# Patient Record
Sex: Female | Born: 1965 | Race: White | Hispanic: No | Marital: Married | Smoking: Never smoker
Health system: Southern US, Community
[De-identification: ages and names within clinical notes are randomized; demographics above are authoritative.]

## PROBLEM LIST (undated history)

## (undated) DIAGNOSIS — R1900 Intra-abdominal and pelvic swelling, mass and lump, unspecified site: Secondary | ICD-10-CM

## (undated) DIAGNOSIS — D369 Benign neoplasm, unspecified site: Secondary | ICD-10-CM

## (undated) DIAGNOSIS — R109 Unspecified abdominal pain: Secondary | ICD-10-CM

## (undated) DIAGNOSIS — N979 Female infertility, unspecified: Secondary | ICD-10-CM

## (undated) DIAGNOSIS — E049 Nontoxic goiter, unspecified: Secondary | ICD-10-CM

## (undated) DIAGNOSIS — O009 Unspecified ectopic pregnancy without intrauterine pregnancy: Secondary | ICD-10-CM

## (undated) DIAGNOSIS — C569 Malignant neoplasm of unspecified ovary: Secondary | ICD-10-CM

## (undated) DIAGNOSIS — Z8619 Personal history of other infectious and parasitic diseases: Secondary | ICD-10-CM

## (undated) DIAGNOSIS — E039 Hypothyroidism, unspecified: Secondary | ICD-10-CM

## (undated) DIAGNOSIS — M7500 Adhesive capsulitis of unspecified shoulder: Secondary | ICD-10-CM

## (undated) DIAGNOSIS — K649 Unspecified hemorrhoids: Secondary | ICD-10-CM

## (undated) HISTORY — DX: Personal history of other infectious and parasitic diseases: Z86.19

## (undated) HISTORY — DX: Nontoxic goiter, unspecified: E04.9

## (undated) HISTORY — DX: Hypothyroidism, unspecified: E03.9

## (undated) HISTORY — DX: Unspecified hemorrhoids: K64.9

## (undated) HISTORY — DX: Intra-abdominal and pelvic swelling, mass and lump, unspecified site: R19.00

## (undated) HISTORY — PX: LAPAROSCOPY FOR ECTOPIC PREGNANCY: SUR765

## (undated) HISTORY — DX: Malignant neoplasm of unspecified ovary: C56.9

## (undated) HISTORY — DX: Benign neoplasm, unspecified site: D36.9

## (undated) HISTORY — DX: Female infertility, unspecified: N97.9

## (undated) HISTORY — DX: Unspecified abdominal pain: R10.9

## (undated) HISTORY — DX: Adhesive capsulitis of unspecified shoulder: M75.00

---

## 1999-09-25 ENCOUNTER — Other Ambulatory Visit: Admission: RE | Admit: 1999-09-25 | Discharge: 1999-09-25 | Payer: Self-pay | Admitting: *Deleted

## 2000-12-22 ENCOUNTER — Other Ambulatory Visit: Admission: RE | Admit: 2000-12-22 | Discharge: 2000-12-22 | Payer: Self-pay | Admitting: *Deleted

## 2001-12-28 ENCOUNTER — Other Ambulatory Visit: Admission: RE | Admit: 2001-12-28 | Discharge: 2001-12-28 | Payer: Self-pay | Admitting: Obstetrics and Gynecology

## 2002-01-22 ENCOUNTER — Encounter: Admission: RE | Admit: 2002-01-22 | Discharge: 2002-01-22 | Payer: Self-pay | Admitting: Family Medicine

## 2002-01-22 ENCOUNTER — Encounter: Payer: Self-pay | Admitting: Family Medicine

## 2002-07-29 ENCOUNTER — Encounter (INDEPENDENT_AMBULATORY_CARE_PROVIDER_SITE_OTHER): Payer: Self-pay | Admitting: Specialist

## 2002-07-29 ENCOUNTER — Observation Stay (HOSPITAL_COMMUNITY): Admission: AD | Admit: 2002-07-29 | Discharge: 2002-07-30 | Payer: Self-pay | Admitting: Obstetrics and Gynecology

## 2002-07-29 ENCOUNTER — Encounter: Payer: Self-pay | Admitting: Emergency Medicine

## 2003-03-01 ENCOUNTER — Other Ambulatory Visit: Admission: RE | Admit: 2003-03-01 | Discharge: 2003-03-01 | Payer: Self-pay | Admitting: Obstetrics & Gynecology

## 2003-09-11 ENCOUNTER — Inpatient Hospital Stay (HOSPITAL_COMMUNITY): Admission: AD | Admit: 2003-09-11 | Discharge: 2003-09-14 | Payer: Self-pay | Admitting: Obstetrics & Gynecology

## 2003-09-15 ENCOUNTER — Encounter: Admission: RE | Admit: 2003-09-15 | Discharge: 2003-10-15 | Payer: Self-pay | Admitting: Obstetrics & Gynecology

## 2003-10-16 ENCOUNTER — Encounter: Admission: RE | Admit: 2003-10-16 | Discharge: 2003-11-15 | Payer: Self-pay | Admitting: Obstetrics & Gynecology

## 2003-10-24 ENCOUNTER — Other Ambulatory Visit: Admission: RE | Admit: 2003-10-24 | Discharge: 2003-10-24 | Payer: Self-pay | Admitting: Obstetrics & Gynecology

## 2003-12-16 ENCOUNTER — Encounter: Admission: RE | Admit: 2003-12-16 | Discharge: 2004-01-15 | Payer: Self-pay | Admitting: Obstetrics & Gynecology

## 2004-02-15 ENCOUNTER — Encounter: Admission: RE | Admit: 2004-02-15 | Discharge: 2004-03-16 | Payer: Self-pay | Admitting: Obstetrics & Gynecology

## 2004-03-17 ENCOUNTER — Encounter: Admission: RE | Admit: 2004-03-17 | Discharge: 2004-04-16 | Payer: Self-pay | Admitting: Obstetrics & Gynecology

## 2004-05-17 ENCOUNTER — Encounter: Admission: RE | Admit: 2004-05-17 | Discharge: 2004-06-16 | Payer: Self-pay | Admitting: Obstetrics & Gynecology

## 2005-05-16 ENCOUNTER — Encounter: Admission: RE | Admit: 2005-05-16 | Discharge: 2005-05-16 | Payer: Self-pay | Admitting: Family Medicine

## 2008-07-02 ENCOUNTER — Inpatient Hospital Stay (HOSPITAL_COMMUNITY): Admission: AD | Admit: 2008-07-02 | Discharge: 2008-07-02 | Payer: Self-pay | Admitting: Obstetrics

## 2009-02-12 ENCOUNTER — Inpatient Hospital Stay (HOSPITAL_COMMUNITY): Admission: AD | Admit: 2009-02-12 | Discharge: 2009-02-12 | Payer: Self-pay | Admitting: Obstetrics and Gynecology

## 2009-07-18 ENCOUNTER — Inpatient Hospital Stay (HOSPITAL_COMMUNITY): Admission: AD | Admit: 2009-07-18 | Discharge: 2009-07-20 | Payer: Self-pay | Admitting: Obstetrics & Gynecology

## 2010-02-10 ENCOUNTER — Encounter: Admission: RE | Admit: 2010-02-10 | Discharge: 2010-03-12 | Payer: Self-pay | Admitting: Obstetrics & Gynecology

## 2010-03-13 ENCOUNTER — Encounter: Admission: RE | Admit: 2010-03-13 | Discharge: 2010-04-10 | Payer: Self-pay | Admitting: Obstetrics & Gynecology

## 2010-04-13 ENCOUNTER — Encounter: Admission: RE | Admit: 2010-04-13 | Discharge: 2010-05-09 | Payer: Self-pay | Admitting: Obstetrics & Gynecology

## 2010-09-30 LAB — CBC
HCT: 33.2 % — ABNORMAL LOW (ref 36.0–46.0)
HCT: 33.5 % — ABNORMAL LOW (ref 36.0–46.0)
Hemoglobin: 11.4 g/dL — ABNORMAL LOW (ref 12.0–15.0)
MCHC: 33.3 g/dL (ref 30.0–36.0)
MCHC: 34 g/dL (ref 30.0–36.0)
MCV: 98.5 fL (ref 78.0–100.0)
Platelets: 152 10*3/uL (ref 150–400)
Platelets: 173 10*3/uL (ref 150–400)
RBC: 3.4 MIL/uL — ABNORMAL LOW (ref 3.87–5.11)
RDW: 13.3 % (ref 11.5–15.5)
RDW: 13.4 % (ref 11.5–15.5)
WBC: 10.3 10*3/uL (ref 4.0–10.5)

## 2010-09-30 LAB — RPR: RPR Ser Ql: NONREACTIVE

## 2010-11-27 NOTE — Consult Note (Signed)
NAMEELIANAH, Candice Zamora                ACCOUNT NO.:  [id number]   MEDICAL RECORD NO.:  [id number]          PATIENT TYPE:  MAT   LOCATION:  [facility]                          FACILITY:  [facility]   PHYSICIAN:  Lenoard Aden, M.D.DATE OF BIRTH:  1965/08/11   DATE OF CONSULTATION:  02/12/2009  DATE OF DISCHARGE:  02/12/2009                                 CONSULTATION   CHIEF COMPLAINT:  Bleeding.   HISTORY:  She is a 45 year old white female G4, P1 history of ectopic,  history of SAB, history of vaginal delivery, now currently at 18 weeks  of bleeding has a known endocervical polyp.  She otherwise had an  uncomplicated pregnancy to date.   MEDICAL PROBLEMS:  History of ectopic infertility, thyroid disease,  depression, dermoid cyst and ectopic pregnancy is noted.   ALLERGIES:  She has allergies to MILK and SULFA DRUGS.   MEDICATIONS:  She takes prenatal vitamins, Zofran, thyroid and  supplementation.   FAMILY HISTORY:  Heart disease, diabetes, breast cancer, thyroid  disease, depression, pregnancy history as previously noted.   PHYSICAL EXAMINATION:  GENERAL:  She is a well-developed, well-nourished  white female.  VITAL SIGNS:  Stable, afebrile.  HEENT:  Normal.  LUNGS:  Clear.  HEART:  Regular rate and rhythm.  ABDOMEN:  Soft, gravid, nontender.  Fetal heart tones auscultated.  No  CVA tenderness.   PELVIC:  Speculum was then placed, no active bleeding noted.  There is  approximately 0.5 cm endocervical polyp projecting from the cervical os  with some varicosities on its exterior portion.  These were cauterized  using silver nitrate and good hemostasis was noted and achieved.  Cervix  is closed and long and no evidence of preterm cervical change.  EXTREMITIES:  There are no cords.  NEUROLOGIC:  Nonfocal.   Fetal heart tones as noted.   Ultrasound pending.   IMPRESSION:  1. An 18-week OB.  2. Second trimester bleeding and an etiology questionable due to      cervical  polyp.   PLAN:  Check ultrasound.  The patient's blood type is Rh positive, we  will discharge home.      Lenoard Aden, M.D.  Electronically Signed     RJT/MEDQ  D:  02/12/2009  T:  02/13/2009  Job:  [id number]

## 2010-11-30 NOTE — H&P (Signed)
NAME:  Candice Zamora, Candice Zamora                          ACCOUNT NO.:  [id number]   MEDICAL RECORD NO.:  [id number]                   PATIENT TYPE:  INP   LOCATION:  9162                                 [facility]ACILITY:  [facility]   PHYSICIAN:  Lenoard Aden, M.D.             DATE OF BIRTH:  09-07-1965   DATE OF ADMISSION:  09/11/2003  DATE OF DISCHARGE:                                HISTORY & PHYSICAL   CHIEF COMPLAINT:  Spontaneous rupture of membranes at 1630.   HISTORY OF PRESENT ILLNESS:  The patient is a 45 year old white female, G2,  P0 with an EDD of September 26, 2003 who presents at 37+ weeks with spontaneous  rupture of membranes of clear fluid.   ALLERGIES:  SULFA.   MEDICATIONS:  Prenatal vitamins and Synthroid.   PAST MEDICAL HISTORY:  History of TAB in 1991 without complications. History  of ectopic in January 2004. History of hypothyroidism as noted.   FAMILY HISTORY:  Breast cancer, insulin-dependent diabetes, and heart  disease.   PRENATAL LABORATORY DATA:  Blood type O positive, Rh antibody negative,  Rubella immune, hepatitis and HIV negative.   PHYSICAL EXAMINATION:  GENERAL:  A well developed, well nourished, white  female in no acute distress.  HEENT:  Normal.  LUNGS:  Clear.  HEART:  Regular rate and rhythm.  ABDOMEN:  Soft, gravid, non-tender. Estimated fetal weight 9 pounds.  GENITOURINARY:  Cervix per R.N. fingertip, thick, vertex, and minus 3.  EXTREMITIES:  No cords.  NEUROLOGIC:  Examination is non-focal. NST is reactive.   PHYSICAL EXAMINATION:  VITAL SIGNS: Blood pressure 124/78 as previously  noted.   IMPRESSION:  1. Term intrauterine pregnancy.  2. Spontaneous rupture of membranes with prodromal labor pattern. GBS     negative.   PLAN:  Expected management. Pitocin augmentation as needed. Anticipate  attempts at vaginal delivery.                                               Lenoard Aden, M.D.    RJT/MEDQ  D:  09/11/2003  T:  09/12/2003  Job:   [id number]

## 2010-11-30 NOTE — Op Note (Signed)
Candice Zamora, Candice Zamora                            ACCOUNT NO.:  [id number]   MEDICAL RECORD NO.:  [id number]                   PATIENT TYPE:  AMB   LOCATION:  [facility]                                  [facility]ILITY:  [facility]   PHYSICIAN:  Genia Del, M.D.             DATE OF BIRTH:  05/04/1966   DATE OF PROCEDURE:  07/29/2002  DATE OF DISCHARGE:                                 OPERATIVE REPORT   PREOPERATIVE DIAGNOSIS:  Probable ectopic pregnancy, possible early rupture.   POSTOPERATIVE DIAGNOSIS:  Right tubal ectopic pregnancy with hemoperitoneum  about 500 cc.  Partial distal abortion, no tubal rupture.   OPERATION PERFORMED:  Laparoscopy with right salpingostomy for treatment of  ectopic and evacuation of hemoperitoneum.   SURGEON:  Genia Del, M.D.   ASSISTANT:  Maxie Better, M.D.   ANESTHESIOLOGIST:  Raul Del, M.D.   DESCRIPTION OF PROCEDURE:  Under general anesthesia with endotracheal  intubation, the patient was in lithotomy position for operative laparoscopy.  She was prepped with Hibiclens on the abdominal, suprapubic, vulvar and  vaginal area.  The bladder was catheterized and the patient was draped as  usual.  Vaginally, the speculum was inserted.  The uterus was cannulated  with a ______ tenaculum.  We removed the speculum.  Abdominally, we made an  incision under the umbilicus over 10 mm with a scalpel, infiltration of  Marcaine 0.25% plain was done with 5 cc prior to the incision.  We inserted  the Veress needle while raising the abdominal wall.  The security tests are  done and pneumoperitoneum of 3L was achieved.  Pressures were good during  all that time.  We then removed the Veress needle.  We inserted a trocar and  the camera at that level.  We visualized a hemoperitoneum of about 500 cc at  entrance.  Pictures were taken.  We then made a second incision at the  suprapubic area over 10 mm with a scalpel after infiltrating the skin with  Marcaine  0.25% plain.  A 10 mm trocar was inserted at that level and the  large suction was used to suction the hemoperitoneum.  This was accomplished  successfully.  We visualized the pelvic cavity.  The uterus was normal in  volume and appearance.  The left tube and left ovary were normal in size and  appearance.  The right tube had an ampullary ectopic pregnancy and partial  distal abortion was occurring with some bleeding through the tubal lumen.  No tubal rupture was seen.  We made a third incision over the left iliac  area with the scalpel over 5 mm after injecting with Marcaine 0.25% plain.  We used the Unipolar point to make a salpingostomy at the ante mesosalpinx  aspect of the ampulla but Nezhat was used to irrigate and find the tube and  the ectopic pregnancy was removed from the right tube in that manner.  We  made sure that no products of conception remained inside the lumen of the  tube proximally or distally.  We had complete hemostasis at the level of the  right salpingostomy with the bipolar.  We then used an Endobag to remove the  products of conception which were sent to pathology.  Good hemostasis was  obtained.  The abdominal inspection was unremarkable.  No pathology was  seen.  Pictures were taken of both adnexa before the salpingostomy was done  and also after, showing good hemostasis.  We removed the instruments.  The  CO2 was evacuated.  We closed the infraumbilical incision with Monocryl 4-0  in a subcuticular stitch.  The incision was too small to be able to close  the aponeurosis at that level.  We closed the two other incisions at the  skin with Dermabond.  The vaginal instruments were removed.  The count of  instruments and sponges was complete.  The estimated blood loss was about  500 cc.  Hemoperitoneum was another 100 cc for a total  of 600 cc.  No complication occurred and the patient was transferred to  recovery room in good status.  Note that her blood group is Rh  positive and  she received Ancef 1 gm IV at induction.                                                Genia Del, M.D.    ML/MEDQ  D:  07/29/2002  T:  07/29/2002  Job:  [id number]

## 2012-01-08 ENCOUNTER — Other Ambulatory Visit: Payer: Self-pay | Admitting: Family Medicine

## 2012-01-08 DIAGNOSIS — Z1231 Encounter for screening mammogram for malignant neoplasm of breast: Secondary | ICD-10-CM

## 2012-01-20 ENCOUNTER — Ambulatory Visit
Admission: RE | Admit: 2012-01-20 | Discharge: 2012-01-20 | Disposition: A | Discharge status: Home / Self Care | Payer: 59 | Source: Ambulatory Visit | Attending: Family Medicine | Admitting: Family Medicine

## 2012-01-20 DIAGNOSIS — Z1231 Encounter for screening mammogram for malignant neoplasm of breast: Secondary | ICD-10-CM

## 2012-07-15 DIAGNOSIS — C569 Malignant neoplasm of unspecified ovary: Secondary | ICD-10-CM

## 2012-07-15 HISTORY — DX: Malignant neoplasm of unspecified ovary: C56.9

## 2012-09-27 ENCOUNTER — Emergency Department (HOSPITAL_COMMUNITY): Payer: 59

## 2012-09-27 ENCOUNTER — Encounter (HOSPITAL_COMMUNITY): Payer: Self-pay | Admitting: Emergency Medicine

## 2012-09-27 ENCOUNTER — Emergency Department (HOSPITAL_COMMUNITY)
Admission: EM | Admit: 2012-09-27 | Discharge: 2012-09-27 | Disposition: A | Discharge status: Home / Self Care | Payer: 59 | Attending: Emergency Medicine | Admitting: Emergency Medicine

## 2012-09-27 DIAGNOSIS — Z79899 Other long term (current) drug therapy: Secondary | ICD-10-CM | POA: Insufficient documentation

## 2012-09-27 DIAGNOSIS — R142 Eructation: Secondary | ICD-10-CM | POA: Insufficient documentation

## 2012-09-27 DIAGNOSIS — R1110 Vomiting, unspecified: Secondary | ICD-10-CM

## 2012-09-27 DIAGNOSIS — R630 Anorexia: Secondary | ICD-10-CM | POA: Insufficient documentation

## 2012-09-27 DIAGNOSIS — R109 Unspecified abdominal pain: Secondary | ICD-10-CM

## 2012-09-27 DIAGNOSIS — R141 Gas pain: Secondary | ICD-10-CM | POA: Insufficient documentation

## 2012-09-27 DIAGNOSIS — E860 Dehydration: Secondary | ICD-10-CM | POA: Insufficient documentation

## 2012-09-27 DIAGNOSIS — R112 Nausea with vomiting, unspecified: Secondary | ICD-10-CM | POA: Insufficient documentation

## 2012-09-27 DIAGNOSIS — O009 Unspecified ectopic pregnancy without intrauterine pregnancy: Secondary | ICD-10-CM | POA: Insufficient documentation

## 2012-09-27 HISTORY — DX: Unspecified ectopic pregnancy without intrauterine pregnancy: O00.90

## 2012-09-27 LAB — CBC
HCT: 32.4 % — ABNORMAL LOW (ref 36.0–46.0)
Hemoglobin: 10.7 g/dL — ABNORMAL LOW (ref 12.0–15.0)
MCH: 28.9 pg (ref 26.0–34.0)
MCHC: 33 g/dL (ref 30.0–36.0)
MCV: 87.6 fL (ref 78.0–100.0)
Platelets: 274 K/uL (ref 150–400)
RBC: 3.7 MIL/uL — ABNORMAL LOW (ref 3.87–5.11)
RDW: 12.7 % (ref 11.5–15.5)
WBC: 8.7 K/uL (ref 4.0–10.5)

## 2012-09-27 LAB — BASIC METABOLIC PANEL
CO2: 26 mEq/L (ref 19–32)
Calcium: 9.8 mg/dL (ref 8.4–10.5)
GFR calc Af Amer: 62 mL/min — ABNORMAL LOW (ref >90)
GFR calc non Af Amer: 54 mL/min — ABNORMAL LOW (ref >90)
Sodium: 140 mEq/L (ref 135–145)

## 2012-09-27 LAB — URINALYSIS, ROUTINE W REFLEX MICROSCOPIC
Bilirubin Urine: NEGATIVE
Glucose, UA: NEGATIVE mg/dL
Hgb urine dipstick: NEGATIVE
Ketones, ur: NEGATIVE mg/dL
Nitrite: NEGATIVE
Protein, ur: 30 mg/dL — AB
Specific Gravity, Urine: 1.026 (ref 1.005–1.030)
Urobilinogen, UA: 0.2 mg/dL (ref 0.0–1.0)
pH: 8.5 — ABNORMAL HIGH (ref 5.0–8.0)

## 2012-09-27 LAB — URINE MICROSCOPIC-ADD ON

## 2012-09-27 LAB — LIPASE, BLOOD: Lipase: 20 U/L (ref 11–59)

## 2012-09-27 MED ORDER — OXYCODONE-ACETAMINOPHEN 5-325 MG PO TABS: 1.0000 | ORAL_TABLET | Freq: Four times a day (QID) | ORAL | Status: DC | PRN

## 2012-09-27 MED ORDER — SODIUM CHLORIDE 0.9 % IV SOLN
INTRAVENOUS | Status: DC
  Administered 2012-09-27: 07:00:00 via INTRAVENOUS

## 2012-09-27 MED ORDER — HYDROMORPHONE HCL PF 1 MG/ML IJ SOLN
0.5000 mg | Freq: Once | INTRAMUSCULAR | Status: AC
  Administered 2012-09-27: 0.5 mg via INTRAVENOUS
  Filled 2012-09-27: qty 1

## 2012-09-27 MED ORDER — SODIUM CHLORIDE 0.9 % IV BOLUS (SEPSIS)
500.0000 mL | Freq: Once | INTRAVENOUS | Status: AC
  Administered 2012-09-27: 500 mL via INTRAVENOUS

## 2012-09-27 MED ORDER — ONDANSETRON HCL 4 MG/2ML IJ SOLN
4.0000 mg | Freq: Once | INTRAMUSCULAR | Status: AC
  Administered 2012-09-27: 4 mg via INTRAVENOUS
  Filled 2012-09-27: qty 2

## 2012-09-27 MED ORDER — ONDANSETRON HCL 4 MG PO TABS: 4.0000 mg | ORAL_TABLET | Freq: Four times a day (QID) | ORAL | Status: DC

## 2012-09-27 MED ORDER — HYDROMORPHONE HCL PF 1 MG/ML IJ SOLN
0.5000 mg | Freq: Once | INTRAMUSCULAR | Status: AC
  Administered 2012-09-27: 1 mg via INTRAVENOUS
  Filled 2012-09-27: qty 1

## 2012-09-27 NOTE — ED Provider Notes (Signed)
Medical screening examination/treatment/procedure(s) were performed by non-physician practitioner and as supervising physician I was immediately available for consultation/collaboration.  Flint Melter, MD 09/27/12 1640

## 2012-09-27 NOTE — ED Notes (Signed)
Patient discharged using the teach back method patient verbalizes an understanding

## 2012-09-27 NOTE — ED Provider Notes (Signed)
History     CSN: [id number]  Arrival date & time 09/27/12  0447   First MD Initiated Contact with Patient 09/27/12 [phone]      Chief Complaint  Patient presents with  . Abdominal Pain    (Consider location/radiation/quality/duration/timing/severity/associated sxs/prior treatment) Patient is a 47 y.o. female presenting with abdominal pain. The history is provided by the patient.  Abdominal Pain Pain location:  RUQ Pain quality: aching   Pain quality comment:  "tight"  Pain radiates to:  Does not radiate Pain severity:  Moderate Onset quality:  Sudden Duration:  10 hours Timing:  Constant Progression:  Unchanged Chronicity:  New Context: sick contacts (daughter- stomach virus )   Context: not alcohol use, not recent illness, not recent travel and not suspicious food intake   Relieved by:  Nothing Worsened by:  Palpation and movement Ineffective treatments:  Vomiting Associated symptoms: anorexia, belching, nausea and vomiting   Associated symptoms: no chest pain, no chills, no constipation (LNBM yest AM), no cough, no diarrhea, no dysuria, no fatigue, no fever, no hematemesis, no hematochezia, no hematuria, no melena, no sore throat, no vaginal bleeding and no vaginal discharge   Vomiting:    Quality:  Stomach contents and bilious material   Number of occurrences:  5   Severity:  Moderate   Progression:  Unchanged Risk factors: no alcohol abuse, no NSAID use and no recent hospitalization     Past Medical History  Diagnosis Date  . Ectopic pregnancy     History reviewed. No pertinent past surgical history.  History reviewed. No pertinent family history.  History  Substance Use Topics  . Smoking status: Never Smoker   . Smokeless tobacco: Not on file  . Alcohol Use: No    OB History   Grav Para Term Preterm Abortions TAB SAB Ect Mult Living                  Review of Systems  Constitutional: Negative for fever, chills and fatigue.  HENT: Negative for sore  throat.   Respiratory: Negative for cough.   Cardiovascular: Negative for chest pain.  Gastrointestinal: Positive for nausea, vomiting, abdominal pain and anorexia. Negative for diarrhea, constipation (LNBM yest AM), melena, hematochezia and hematemesis.  Genitourinary: Negative for dysuria, hematuria, vaginal bleeding and vaginal discharge.  All other systems reviewed and are negative.    Allergies  Sulfa antibiotics  Home Medications   Current Outpatient Rx  Name  Route  Sig  Dispense  Refill  . levothyroxine (SYNTHROID, LEVOTHROID) 50 MCG tablet   Oral   Take 50 mcg by mouth daily.           BP 126/73  Temp(Src) 98.1 F (36.7 C) (Oral)  Resp 18  SpO2 100%  LMP 09/13/2012  Physical Exam  Nursing note and vitals reviewed. Constitutional: Vital signs are normal. She appears well-developed and well-nourished. No distress.  HENT:  Head: Normocephalic and atraumatic.  Mouth/Throat: Uvula is midline, oropharynx is clear and moist and mucous membranes are normal.  Eyes: Conjunctivae and EOM are normal. Pupils are equal, round, and reactive to light.  Neck: Normal range of motion and full passive range of motion without pain. Neck supple. No spinous process tenderness and no muscular tenderness present. No rigidity. No Brudzinski's sign noted.  Cardiovascular: Normal rate and regular rhythm.   Pulmonary/Chest: Effort normal and breath sounds normal. No accessory muscle usage. Not tachypneic. No respiratory distress.  Abdominal: Soft. Normal appearance. She exhibits no distension, no ascites,  no pulsatile midline mass and no mass. There is no CVA tenderness. No hernia.  Soft abdomen, non tender to palpation (subjective RUQ pain, neg Murphy's sign & McBurney)   Lymphadenopathy:    She has no cervical adenopathy.  Neurological: She is alert.  Skin: Skin is warm and dry. No rash noted. She is not diaphoretic.  Psychiatric: She has a normal mood and affect. Her speech is normal  and behavior is normal.    ED Course  Procedures (including critical care time)  Labs Reviewed  CBC - Abnormal; Notable for the following:    RBC 3.70 (*)    Hemoglobin 10.7 (*)    HCT 32.4 (*)    All other components within normal limits  BASIC METABOLIC PANEL - Abnormal; Notable for the following:    Glucose, Bld 175 (*)    Creatinine, Ser 1.19 (*)    GFR calc non Af Amer 54 (*)    GFR calc Af Amer 62 (*)    All other components within normal limits  LIPASE, BLOOD  URINALYSIS, ROUTINE W REFLEX MICROSCOPIC  POCT PREGNANCY, URINE   US Abdomen Complete  09/27/2012  *RADIOLOGY REPORT*  Clinical Data:  Right upper quadrant pain.  ABDOMINAL ULTRASOUND COMPLETE  Comparison:  None.  Findings:  Gallbladder:  No gallstones, gallbladder wall thickening, or pericholecystic fluid.  Common Bile Duct:  Within normal limits in caliber. Measures 3 mm in diameter.  Liver: A small cyst is noted in the posterior hepatic lobe measuring 1.8 cm in maximum diameter.  No solid liver masses are identified.  Within normal limits in parenchymal echogenicity.  IVC:  Appears normal.  Pancreas:  No abnormality identified.  Spleen:  Within normal limits in size and echotexture.  Right kidney:  Measures 11.5 cm in length.  Diffusely increased parenchymal echogenicity seen.  No renal mass identified.  Mild pelvicaliectasis noted.  Left kidney:  Measures 11.5 cm in length.  Increased parenchymal echogenicity noted.  Minimal pelvicaliectasis seen.  Abdominal Aorta:  No aneurysm identified.  IMPRESSION:  1. 1.  No evidence of cholelithiasis or biliary dilatation. 2.  Normal sized kidneys with increased parenchymal echogenicity, consistent with medical renal disease. 3.  Mild right and minimal left renal pelvicaliectasis.  Etiology not apparent by ultrasound.   Original Report Authenticated By: Myles Rosenthal, M.D.      No diagnosis found.    MDM  Abdominal pain and emesis  Patient is a 47 year old female that presented  to the emergency department complaining of right upper quadrant abdominal pain, nausea, and vomiting onset 6 hours ago.  Physical exam showed no abdominal tenderness throughout entire stay in the emergency department.  Vital signs have been normal and pain is been managed in the emergency department.  Labs and imaging reviewed showing no leukocytosis or clear etiology of presentation.  Incidental finding of parenchymal echogenicity discussed with patient who is aware to followup for serial kidney function and urine tests with primary care provider. Patient passed a by mouth challenge and will be able to hydrate orally at home.  Discussed possible etiologies of biliary colic versus viral gastritis.  Patient advised to followup with her primary care provider for further evaluation and possible referral to general surgery.  At this time there does not appear to be any evidence of an acute emergency medical condition and the patient appears stable for discharge with appropriate outpatient follow up. Diagnosis was discussed with patient who verbalizes understanding and is agreeable to discharge.  Jaci Carrel, [place] 09/27/12 [phone]

## 2012-09-27 NOTE — ED Notes (Signed)
Patient complaining of right upper quadrant abdominal pain, nausea, and vomiting that started six hours ago.  Patient feels that it might be her gallbladder; had gallbladder act up around 10 years ago; denies gallbladder removal.

## 2012-09-29 LAB — URINE CULTURE: Colony Count: NO GROWTH

## 2012-11-10 ENCOUNTER — Ambulatory Visit
Admission: RE | Admit: 2012-11-10 | Discharge: 2012-11-10 | Disposition: A | Discharge status: Home / Self Care | Payer: 59 | Source: Ambulatory Visit | Attending: Obstetrics & Gynecology | Admitting: Obstetrics & Gynecology

## 2012-11-10 ENCOUNTER — Other Ambulatory Visit: Payer: Self-pay | Admitting: Obstetrics & Gynecology

## 2012-11-10 DIAGNOSIS — R1900 Intra-abdominal and pelvic swelling, mass and lump, unspecified site: Secondary | ICD-10-CM

## 2012-11-10 MED ORDER — GADOBENATE DIMEGLUMINE 529 MG/ML IV SOLN
14.0000 mL | Freq: Once | INTRAVENOUS | Status: AC | PRN
  Administered 2012-11-10: 14 mL via INTRAVENOUS

## 2012-11-13 ENCOUNTER — Other Ambulatory Visit: Payer: 59

## 2012-11-16 ENCOUNTER — Encounter: Payer: Self-pay | Admitting: Gynecologic Oncology

## 2012-11-17 ENCOUNTER — Encounter: Payer: Self-pay | Admitting: Gynecology

## 2012-11-17 ENCOUNTER — Ambulatory Visit: Payer: 59 | Attending: Gynecology | Admitting: Gynecology

## 2012-11-17 VITALS — BP 102/62 | HR 88 | Temp 98.3°F | Resp 20 | Ht 66.5 in | Wt 152.1 lb

## 2012-11-17 DIAGNOSIS — R1909 Other intra-abdominal and pelvic swelling, mass and lump: Secondary | ICD-10-CM | POA: Insufficient documentation

## 2012-11-17 DIAGNOSIS — R978 Other abnormal tumor markers: Secondary | ICD-10-CM | POA: Insufficient documentation

## 2012-11-17 DIAGNOSIS — R1900 Intra-abdominal and pelvic swelling, mass and lump, unspecified site: Secondary | ICD-10-CM | POA: Insufficient documentation

## 2012-11-17 DIAGNOSIS — D287 Benign neoplasm of other specified female genital organs: Secondary | ICD-10-CM | POA: Insufficient documentation

## 2012-11-17 DIAGNOSIS — R971 Elevated cancer antigen 125 [CA 125]: Secondary | ICD-10-CM

## 2012-11-17 DIAGNOSIS — R188 Other ascites: Secondary | ICD-10-CM | POA: Insufficient documentation

## 2012-11-17 NOTE — Patient Instructions (Signed)
Surgery is scheduled for 11/24/2012 at [facility]. You'll have a preop workup on 11/20/2012 11:30 AM.

## 2012-11-17 NOTE — Progress Notes (Signed)
Consult Note: Gyn-Onc   Matthias Hughs 47 y.o. female  Chief Complaint  Patient presents with  . Pelvic Mass    New patient    Assessment : Complex pelvic mass of either uterine or ovarian origin. The elevated tumor markers are concerning as are the imaging characteristics suggesting malignancy.  Plan: I had a lengthy discussion with the patient and her husband regarding our findings. My impression she likely has either ovarian  Cancer or a leiomyosarcoma of the uterus. In either event and recommend surgical resection. She will like to expedite surgery as early as possible she is therefore scheduled to undergo surgery at [facility] on 11/24/2012.  The extent of surgery including abdominal hysterectomy bilateral salpingo-oophorectomy surgical staging and possible bowel resection were outlined. If we are fortunately this is a benign lesion, the patient remains undecided as to whether to preserve her residual left ovary (presumed it looks normal). She will tell me her final decision the day of surgery.  The risks of surgery including hemorrhage infection, injury to adjacent viscera, thromboembolic complications, and anesthetic risks were outlined. Patient understands that she may require bowel resection with low rectal anastomosis.      HPI:  69 her old white married female gravida 4 para 2 seen in consultation request of Dr.Marie-Lyne Seymour Bars regarding management of a newly diagnosed complex pelvic mass. The patient has had several months of lower nominal pain and initially underwent a pelvic ultrasound on April 28 that showed a 10.8 x 8.7 x 9.2 cm heterogeneous pelvic mass. Small fibroids were also noted. The adnexa were poorly seen. Subsequently she underwent a MRI of the pelvis which demonstrated a centrally necrotic pelvic mass which may arise from the posterior uterus or the right adnexa. She had a normal-appearing left ovary moderate ascites and no evidence of adenopathy. CA 125 is 226 units per mL  CEA 1.5 (normal)OVA1 was 9.4 (upper limits of normal for premenopausal patient is 5)  The only relevant gynecologic history is that the patient had an ectopic pregnancy managed laparoscopically in the past.  Currently her symptoms are that of pelvic pressure. She does admit to a 30 pound weight loss over the past 6 months. She correlates this with beginning a gluten free diet. Review of Systems:10 point review of systems is negative except as noted in interval history.   Vitals: Blood pressure 102/62, pulse 88, temperature 98.3 F (36.8 C), resp. rate 20, height 5' 6.5" (1.689 m), weight 152 lb 1.6 oz (68.992 kg), last menstrual period 10/25/2012.  Physical Exam: General : The patient is a healthy woman in no acute distress.  HEENT: normocephalic, extraoccular movements normal; neck is supple without thyromegally  Lynphnodes: Supraclavicular and inguinal nodes not enlarged  Abdomen: Soft, non-tender, no ascites, no organomegally, there is a lower abdominal mass extending nearly to the umbilicus., no hernias  Pelvic:  EGBUS: Normal female  Vagina: Normal, no lesions  Urethra and Bladder: Normal, non-tender  Cervix: normal   Uterus: I believe a separate the uterus from the mass. It feels normal in size. A high the uterus is a large mass filling the entire pelvis somewhat nodular.     Rectal: normal sphincter ton, confirmed pelvic mass., no blood  Lower extremities: No edema or varicosities. Normal range of motion      Allergies  Allergen Reactions  . Sulfa Antibiotics Rash    Past Medical History  Diagnosis Date  . Ectopic pregnancy   . History of chicken pox   . Pelvic  mass in female   . Abdominal pain   . Infertility, female   . Hemorrhoids   . Goiter   . Hypothyroidism   . Dermoid cyst     Past Surgical History  Procedure Laterality Date  . Laparoscopy for ectopic pregnancy      10 years ago    Current Outpatient Prescriptions  Medication Sig Dispense Refill  .  levothyroxine (SYNTHROID, LEVOTHROID) 50 MCG tablet Take 50 mcg by mouth daily.      . ondansetron (ZOFRAN) 4 MG tablet Take 1 tablet (4 mg total) by mouth every 6 (six) hours.  12 tablet  0  . oxyCODONE-acetaminophen (PERCOCET/ROXICET) 5-325 MG per tablet Take 1 tablet by mouth every 6 (six) hours as needed for pain.  15 tablet  0   No current facility-administered medications for this visit.    History   Social History  . Marital Status: Married    Spouse Name: N/A    Number of Children: N/A  . Years of Education: N/A   Occupational History  . Not on file.   Social History Main Topics  . Smoking status: Never Smoker   . Smokeless tobacco: Not on file  . Alcohol Use: No  . Drug Use: No  . Sexually Active: Not on file   Other Topics Concern  . Not on file   Social History Narrative  . No narrative on file    Family History  Problem Relation Age of Onset  . Hypothyroidism Mother   . Heart disease Mother   . Uterine cancer Mother   . Heart disease Father   . Heart attack Father   . Hypothyroidism Sister   . Breast cancer Maternal Aunt   . Diabetes Maternal Grandfather   . Diabetes Paternal Grandmother       Jeannette Corpus, MD 11/17/2012, 5:02 PM

## 2012-11-26 ENCOUNTER — Ambulatory Visit: Payer: 59 | Admitting: Gynecologic Oncology

## 2012-11-30 ENCOUNTER — Telehealth: Payer: Self-pay | Admitting: Oncology

## 2012-11-30 NOTE — Telephone Encounter (Signed)
S/W PT IN RE NP APPT ON 05/27 @ 3:15 W/DR. LIVESAY. PT IS AWARE OF APPT ON 05/21 @ 10 FOR CHEMO EDU.

## 2012-12-01 ENCOUNTER — Telehealth: Payer: Self-pay | Admitting: Oncology

## 2012-12-01 NOTE — Telephone Encounter (Signed)
C/D 12/01/12 for appt. 12/08/12

## 2012-12-02 ENCOUNTER — Other Ambulatory Visit: Payer: 59

## 2012-12-02 ENCOUNTER — Encounter: Payer: Self-pay | Admitting: *Deleted

## 2012-12-02 ENCOUNTER — Ambulatory Visit: Payer: 59

## 2012-12-06 ENCOUNTER — Other Ambulatory Visit: Payer: Self-pay | Admitting: Oncology

## 2012-12-06 DIAGNOSIS — C561 Malignant neoplasm of right ovary: Secondary | ICD-10-CM

## 2012-12-08 ENCOUNTER — Ambulatory Visit (HOSPITAL_BASED_OUTPATIENT_CLINIC_OR_DEPARTMENT_OTHER): Payer: 59 | Admitting: Oncology

## 2012-12-08 ENCOUNTER — Telehealth: Payer: Self-pay | Admitting: Oncology

## 2012-12-08 ENCOUNTER — Other Ambulatory Visit (HOSPITAL_BASED_OUTPATIENT_CLINIC_OR_DEPARTMENT_OTHER): Payer: 59 | Admitting: Lab

## 2012-12-08 ENCOUNTER — Encounter: Payer: Self-pay | Admitting: Oncology

## 2012-12-08 VITALS — BP 103/65 | HR 79 | Temp 97.9°F | Resp 18 | Ht 66.0 in | Wt 147.5 lb

## 2012-12-08 DIAGNOSIS — C561 Malignant neoplasm of right ovary: Secondary | ICD-10-CM

## 2012-12-08 DIAGNOSIS — R300 Dysuria: Secondary | ICD-10-CM

## 2012-12-08 DIAGNOSIS — C569 Malignant neoplasm of unspecified ovary: Secondary | ICD-10-CM

## 2012-12-08 LAB — COMPREHENSIVE METABOLIC PANEL (CC13)
Alkaline Phosphatase: 56 U/L (ref 40–150)
Glucose: 73 mg/dl (ref 70–99)
Sodium: 143 mEq/L (ref 136–145)
Total Bilirubin: 0.37 mg/dL (ref 0.20–1.20)
Total Protein: 7.7 g/dL (ref 6.4–8.3)

## 2012-12-08 LAB — CBC WITH DIFFERENTIAL/PLATELET
Eosinophils Absolute: 0.2 10*3/uL (ref 0.0–0.5)
HCT: 34 % — ABNORMAL LOW (ref 34.8–46.6)
LYMPH%: 32.5 % (ref 14.0–49.7)
MCHC: 33 g/dL (ref 31.5–36.0)
MCV: 89.5 fL (ref 79.5–101.0)
MONO%: 7 % (ref 0.0–14.0)
NEUT%: 55.4 % (ref 38.4–76.8)
Platelets: 316 10*3/uL (ref 145–400)
RBC: 3.8 10*6/uL (ref 3.70–5.45)

## 2012-12-08 NOTE — Patient Instructions (Signed)
We will call in prescriptions for 2 medicines for nausea, the steroid decadron, and EMLA/ ELLAMAX numbing cream for the portacath. Generics are fine if you prefer those.   Nausea medicine will be compazine (prochlorperazine) and ativan (lorazepam).  The compazine may make you a little drowsy; the ativan will probably make you very drowsy and also a little forgetful around the time that you take a dose.  The night of your first chemo, whether or not you have any nausea, take the ativan at bedtime. The morning after chemo, whether or not any nausea, take compazine. Other than those doses, you can use the nausea medicines according to directions. Note the ativan usually dissolves under tongue and gets in as fast as IV that way if needed.  The decadron (dexamethasone, steroid) comes in 4 mg tablets, so you need to take five tablets = 20 mg with food approximately 12 hours and five tablets with food approximately 6 hours before chemo, to lessen chance of allergic reaction to the taxol liquid.  EMLA cream can go onto portacath ~ 30 - 60 min prior to access.   Phone # for cancer center anytime   [phone]

## 2012-12-08 NOTE — Progress Notes (Signed)
[facility] NEW PATIENT EVALUATION   Name: Candice Zamora Date: 12/08/2012 MRN: [id number] DOB: [birth year]  REFERRING PHYSICIAN: Lina Sayre Other physicians:  Maurice Small, MD, Ronnald Ramp    REASON FOR REFERRAL:  Right ovarian cancer, for adjuvant chemotherapy    HISTORY OF PRESENT ILLNESS:Candice Zamora is a 47 y.o. 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 I am not entirely clear about pathologic stage, as capsule was ruptured at time of path evaluation which may have been done after surgery. Patient attended chemotherapy teaching class prior to visit today re dose dense taxol carboplatin, and has PAC in which was placed at [facility]. Primary care is by Dr Maurice Small and gynecologist is Dr Seymour Bars.  Patient presented with RUQ pain in March 2014, with ED evaluation then including abdominal US not remarkable. She was then found to have pelvic mass, with MRI pelvis in [facility] system 11-11-12 showing centrally necrotic pelvic mass 11.4 x 9.1 x 7.8 cm. She was seen by Dr Yolande Jolly in [place] on 11-17-12 and taken to surgery by him at [facility] on 11-24-12. Surgery was complete debulking including TAH, BSO, omentectomy, right ureterolysis, bilateral pelvic lymphadenectomy and periaortic lymphadenectomy. There is no mention in the operative note of rupture of the ovarian capsule. 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 Pelvic washings ([id number]) negative. Assuming the ovarian capsule was disrupted after surgery, this would be pT1a pN0 for IA staging. Patient was hospitalized at [facility] x [facility] days; per patient and husband, she was transfused 2 units PRBCs at [facility]. Post operative course has  been unremarkable, she continues lovenox, and she is to see Dr Yolande Jolly 01-08-13 in [place].    REVIEW OF SYSTEMS:  30 lb weight loss in 6 months prior to surgery, related to diet changes (gluten free diet); she is also lactose intolerant x 9 years. Slight dysuria since surgery. Constipated after surgery, bowels moving regularly now after dulcolax tablets and pediatric enema. Appetite ok tho she "does not like eating". Severe HA with zofran used during pregnancy. No sinus problems. Has not seen dentist x several years due to traumatic experience in childhood, but no symptomatic active dental problems.Hypothyroid x 15 years, followed by Dr Valentina Lucks q 6 mo with meds recently stable.No GERD, no nausea, IBS symptoms resolved off of gluten, no bleeding. Hot flashes since surgery only. 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 No respiratory symptoms. No LE swelling. No cardiac symptoms.   ALLERGIES: Sulfa antibiotics. Zofran causes severe HA  PAST MEDICAL HISTORY:  has a past medical history of Ectopic pregnancy; History of chicken pox; Pelvic mass in female; Abdominal pain; Infertility, female; Hemorrhoids; Goiter; Hypothyroidism; and Dermoid cyst.   G4P2 Has had mammograms at [facility] 01-2012. Laparoscopy for ectopic pregnancy 10 years ago Fertility treatments for daughter who is now age 11  CURRENT MEDICATIONS: reviewed as listed in EMR. We will begin with compazine and lorazepam as antiemetics; EMLA; decadron 20 mg 12 hrs and 6 hrs prior to first taxol then will decrease to 20 mg 12 hrs prior if she tolerates without difficulty. Pharmacy is [facility] on [address]   SOCIAL HISTORY:  reports that she has never smoked. She does not have  any smokeless tobacco history on file. She reports that she does not drink alcohol or use illicit drugs.  Originally from [place], has been in [place] x 15 years. 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 She is on FMLA x 6 weeks from surgery. Son age 8 who is in Spanish immersion school and will be out on summer break beginning June 16; daughter age 86 who attends day care year round. No family locally.   FAMILY HISTORY: family history includes Breast cancer in her maternal aunt; Diabetes in her maternal grandfather and paternal grandmother; Heart attack in her father; Heart disease in her father and mother; Hypothyroidism in her mother and sister; and Uterine cancer in her mother. Mother with uterine cancer ~ age 58 Maternal aunt with breast cancer in 32s Father died of cardiac disease 19 2 younger sisters healthy Children healthy  LABORATORY DATA:  Results for orders placed in visit on 12/08/12 (from the past 48 hour(s))  CBC WITH DIFFERENTIAL     Status: Abnormal   Collection Time    12/08/12  3:16 PM      Result Value Range   WBC 5.1  3.9 - 10.3 10e3/uL   NEUT# 2.8  1.5 - 6.5 10e3/uL   HGB 11.2 (*) 11.6 - 15.9 g/dL   HCT 69.6 (*) 29.5 - 28.4 %   Platelets 316  145 - 400 10e3/uL   MCV 89.5  79.5 - 101.0 fL   MCH 29.5  25.1 - 34.0 pg   MCHC 33.0  31.5 - 36.0 g/dL   RBC 1.32  4.40 - 1.02 10e6/uL   RDW 15.8 (*) 11.2 - 14.5 %   lymph# 1.7  0.9 - 3.3 10e3/uL   MONO# 0.4  0.1 - 0.9 10e3/uL   Eosinophils Absolute 0.2  0.0 - 0.5 10e3/uL   Basophils Absolute 0.0  0.0 - 0.1 10e3/uL   NEUT% 55.4  38.4 - 76.8 %   LYMPH% 32.5  14.0 - 49.7 %   MONO% 7.0  0.0 - 14.0 %   EOS% 4.2  0.0 - 7.0 %   BASO% 0.9  0.0 - 2.0 %  COMPREHENSIVE METABOLIC PANEL (CC13)     Status: None   Collection Time    12/08/12  3:16 PM      Result Value Range   Sodium 143  136 - 145 mEq/L   Potassium 3.6  3.5 - 5.1 mEq/L   Chloride 104  98 - 107 mEq/L   CO2 27  22 - 29 mEq/L   Glucose 73  70 - 99 mg/dl   BUN 72.5  7.0 - 36.6 mg/dL   Creatinine 1.0  0.6 - 1.1 mg/dL   Total Bilirubin 4.40  0.20 - 1.20 mg/dL   Alkaline Phosphatase 56  40 - 150 U/L   AST 19  5 - 34 U/L    ALT 26  0 - 55 U/L   Total Protein 7.7  6.4 - 8.3 g/dL   Albumin 3.9  3.5 - 5.0 g/dL   Calcium 9.6  8.4 - 34.7 mg/dL     UA available after visit 3-6 RBC, 0-2 WBC, few bacteria, trace LE and culture pending.  CA 125 returned after visit 157, related to recent surgery.  RADIOGRAPHY:    MRI PELVIS WITHOUT AND WITH CONTRAST 11-11-12 Technique: Multiplanar multisequence MR imaging of the pelvis was  performed both before and after administration of intravenous  contrast.  Contrast: 14mL  MULTIHANCE GADOBENATE DIMEGLUMINE 529 MG/ML IV SOLN  Comparison: Limited OB ultrasound 02/12/2009  Findings: There is an inhomogeneous, centrally T2  hyperintense/necrotic, mass arising from the posterior mid uterine  body, making obtuse angles with the adjacent uterine serosal  margin, measuring 11.4 x 9.1 x 7.8 cm. There are other areas where  the mass makes acute angles with the uterine serosal surface, and  no intervening fat plane is visualized between the mass and the  myometrium. One internal possible fluid fluid level is notified.  There is no internal T1-weighted hyperintensity to suggest acute  blood product or fat. There is moderate free pelvic fluid with an  internal fluid fluid level that could suggest blood product,  however.  The left ovary is normal containing several follicles. Right ovary  is not identified.  After administration of contrast, there is irregular peripheral  enhancement of the mass. No lymphadenopathy. Bladder is normal.  Lumbar spine degenerative change is noted. 4 mm right pelvic  sidewall lymph node identified.  IMPRESSION:  Centrally necrotic pelvic mass as described above, which may arise  from the posterior uterine body, or could potentially originate in  the right adnexa and directly invade the adjacent uterus. I do not  identify the right ovary separate from this mass, by review of the  electronic medical record the patient reportedly had remote right   salpingostomy but there is no report of prior oophorectomy being  performed. Differential considerations include leiomyosarcoma,  degenerated leiomyoma, or right ovarian malignancy with secondary  invasion of the uterus (primary or metastatic).  Moderate free fluid with fluid fluid level that may suggest blood  product. Normal left ovary. Surgical consultation is recommended.      PHYSICAL EXAM:  height is 5\' 6"  (1.676 m) and weight is 147 lb 8 oz (66.906 kg). Her oral temperature is 97.9 F (36.6 C). Her blood pressure is 103/65 and her pulse is 79. Her respiration is 18.  Very pleasant lady looks stated age, articulate and good historian, husband very supportive. Easily ambulatory, NAD. HEENT: normal hair pattern, PERRL, not icteric. Oral mucosa moist and clear, several dental fillings, no obvious dental problems. Neck supple without JVD or thyroid mass. Lymphatics: no cervical, supraclavicular, axillary or inguinal adenopathy. Lungs clear to A&P. Back nontender. Breasts bilaterally without dominant mass, skin or nipple findings. Heart RRR no murmur or gallop. Abdomen soft and nontender, not distended, normal BS, surgical incision closed, no erythema or unusual tenderness. No HSM. LE no edema, cords tenderness. Skin without rash or ecchymosis. Neuro CN, motor, sensory, cerebellar nonfocal Portacath right anterior chest with steristrips on, no bruising or other findings of concern.    We have discussed all of history as above, surgical information and recommendation for systemic chemotherapy with dose dense taxol carboplatin. She has been given copy of the [facility] path report. We have discussed the planned chemotherapy including side effects, schedule, antiemetics, premedication decadron and availability of staff at this office. Patient and husband have had questions answered to their satisfaction. She appears to have recovered well from recent surgery and is in agreement with beginning  chemotherapy on 12-14-12. I will see her again at least 6-6. Note patient is very focused on her children.   IMPRESSION / PLAN:   1.endometroid adenocarcinoma of right ovary FIGO 3 with transitional cell features: post complete debulking of probable IA disease on 11-24-12 and to begin dose dense taxol carboplatin on 12-14-12, planned weekly x3 for 6 cycles. With ANC just 2.8 today, she  may need additional neupogen, but we will watch counts initially before adding that. 2.surgical menopause: she gets calcium in rice milk, but may need to add additional supplements at least after chemo completes. 3.lactose and gluten intolerance 4.PAC in 5.dysuria since surgery: UA not obviously infected, cx pending 6.hypothyroid on replacement   Patient and husband have had all questions answered to their satisfaction and are in agreement with plan above. They plan to take children to the beach this weekend.  Reece Packer, MD 12/08/2012 8:17 PM

## 2012-12-08 NOTE — Telephone Encounter (Signed)
Gave pt appt for June 2014 emailed Marcelino Duster regardcing chemo

## 2012-12-09 ENCOUNTER — Telehealth: Payer: Self-pay | Admitting: Gynecologic Oncology

## 2012-12-09 ENCOUNTER — Ambulatory Visit: Payer: 59 | Admitting: Gynecology

## 2012-12-09 ENCOUNTER — Telehealth: Payer: Self-pay | Admitting: *Deleted

## 2012-12-09 ENCOUNTER — Other Ambulatory Visit: Payer: Self-pay | Admitting: *Deleted

## 2012-12-09 ENCOUNTER — Telehealth: Payer: Self-pay

## 2012-12-09 DIAGNOSIS — R1900 Intra-abdominal and pelvic swelling, mass and lump, unspecified site: Secondary | ICD-10-CM

## 2012-12-09 LAB — CA 125: CA 125: 157.2 U/mL — ABNORMAL HIGH (ref 0.0–30.2)

## 2012-12-09 LAB — URINALYSIS, MICROSCOPIC - CHCC
Glucose: NEGATIVE mg/dL
Nitrite: NEGATIVE
Protein: <30 mg/dL
Specific Gravity, Urine: 1.03 (ref 1.003–1.035)
Urobilinogen, UR: 0.2 mg/dL (ref 0.2–1)

## 2012-12-09 MED ORDER — LORAZEPAM 1 MG PO TABS: ORAL_TABLET | ORAL | Status: DC

## 2012-12-09 MED ORDER — LIDOCAINE-PRILOCAINE 2.5-2.5 % EX CREA: TOPICAL_CREAM | CUTANEOUS | Status: DC | PRN

## 2012-12-09 MED ORDER — DEXAMETHASONE 4 MG PO TABS: ORAL_TABLET | ORAL | Status: DC

## 2012-12-09 MED ORDER — PROCHLORPERAZINE MALEATE 10 MG PO TABS: 10.0000 mg | ORAL_TABLET | Freq: Four times a day (QID) | ORAL | Status: DC | PRN

## 2012-12-09 NOTE — Telephone Encounter (Signed)
Returned call to patient about complaints of vaginal spotting.  She reports light pink vaginal spotting intermittently.  Reports mild irritation around the introitus.  Reportable signs and symptoms reviewed including increased vaginal spotting or development of bright red bleeding.  Reassured that the light pink spotting would most likely be from the sutures at the vaginal cuff dissolving since her surgery was on Nov 24, 2012.  Patient reporting that she is going to the beach for vacation and instructed to call the office with an update in 2 to 3 days.  Urinalysis results reviewed and awaiting urine culture results.

## 2012-12-09 NOTE — Telephone Encounter (Signed)
Per staff message and POF I have scheduled appts.  JMW  

## 2012-12-09 NOTE — Telephone Encounter (Signed)
Message copied by Lorine Bears on Wed Dec 09, 2012  5:38 PM ------      Message from: Reece Packer      Created: Wed Dec 09, 2012 12:24 PM       Labs seen and need follow up: please let her know the plain urinalysis does not show obvious infection, but we will follow up the results of the urine culture when those are available. The CA 125 returned at 157, which we expect to be up somewhat just due to recent surgery as we discussed.      She is for first chemo with dose dense weekly regimen next week, so please see if other questions since I saw her as new patient yesterday.  thanks ------

## 2012-12-10 ENCOUNTER — Telehealth: Payer: Self-pay | Admitting: Oncology

## 2012-12-10 LAB — URINE CULTURE

## 2012-12-10 NOTE — Telephone Encounter (Signed)
Candice Zamora returned call.  Told her lab results and noted below by Dr. Darrold Span. The urine culture results are back this afternoon and show no infection.  Candice Zamora states that she feels the discomfort with urination comes from the stiches from surgery. Pt. had not heard from the schedulers as to when her treatment would be Monday  12-14-12.  Gave her appt. time for 1145 for lab and 1215 for treatment.  Reviewed times for decadron pre meds for Sunday 12-13-12 and 0600 Monday 12-14-12. Reviewed application of EMLA Cream to [facility] site 2 hours prior to visit and use press and seal over cream not gauze.  Pt. verbalized understanding.

## 2012-12-10 NOTE — Telephone Encounter (Signed)
Called pt and left message regarding appt for June 2014, lab ,MD and chemo

## 2012-12-10 NOTE — Telephone Encounter (Signed)
LM in VM for cell phone to call the office regarding lab results from 12-08-12 and to see if she had any questions about her treatment that is to begin next week ans noted below by Dr. Darrold Span.

## 2012-12-13 DIAGNOSIS — C569 Malignant neoplasm of unspecified ovary: Secondary | ICD-10-CM | POA: Insufficient documentation

## 2012-12-14 ENCOUNTER — Other Ambulatory Visit: Payer: 59 | Admitting: Lab

## 2012-12-14 ENCOUNTER — Ambulatory Visit: Payer: 59

## 2012-12-14 ENCOUNTER — Encounter: Payer: Self-pay | Admitting: *Deleted

## 2012-12-14 ENCOUNTER — Telehealth: Payer: Self-pay | Admitting: *Deleted

## 2012-12-14 ENCOUNTER — Encounter: Payer: Self-pay | Admitting: Pharmacist

## 2012-12-14 NOTE — Telephone Encounter (Signed)
Pt left voice mail that she was diagnosed with shingles over the weekend. Was supposed to start 1st Taxol/Carbo today at 12:15. Was advised to call Dr Darrold Span to make her aware. Wants to know if she should come in for chemo today

## 2012-12-14 NOTE — Telephone Encounter (Signed)
Spoke with patient earlier in am. Instructed that she would not have chemo today, do not take anymore steroids today. Pt reports that shingles are ~ size of 3 hands on left side of back near buttocks and a very small amount on left side of stomach. Was started on valacyclovir 500mg , 2 tablets every 8 hours. Dr Darrold Span plans to move 1st chemo to 6/10 and will see patient on 6/6 as scheduled. Pt to call us if she needs to be sooner and we will schedule her with Tiana Loft, PA

## 2012-12-18 ENCOUNTER — Encounter: Payer: Self-pay | Admitting: Oncology

## 2012-12-18 ENCOUNTER — Ambulatory Visit (HOSPITAL_BASED_OUTPATIENT_CLINIC_OR_DEPARTMENT_OTHER): Payer: 59 | Admitting: Oncology

## 2012-12-18 ENCOUNTER — Telehealth: Payer: Self-pay

## 2012-12-18 ENCOUNTER — Other Ambulatory Visit (HOSPITAL_BASED_OUTPATIENT_CLINIC_OR_DEPARTMENT_OTHER): Payer: 59 | Admitting: Lab

## 2012-12-18 VITALS — BP 113/76 | HR 95 | Temp 97.9°F | Resp 18 | Ht 66.0 in | Wt 145.3 lb

## 2012-12-18 DIAGNOSIS — C561 Malignant neoplasm of right ovary: Secondary | ICD-10-CM

## 2012-12-18 DIAGNOSIS — B029 Zoster without complications: Secondary | ICD-10-CM

## 2012-12-18 DIAGNOSIS — C569 Malignant neoplasm of unspecified ovary: Secondary | ICD-10-CM

## 2012-12-18 LAB — CBC WITH DIFFERENTIAL/PLATELET
Basophils Absolute: 0 10*3/uL (ref 0.0–0.1)
EOS%: 2.9 % (ref 0.0–7.0)
Eosinophils Absolute: 0.1 10*3/uL (ref 0.0–0.5)
HCT: 35 % (ref 34.8–46.6)
HGB: 11.9 g/dL (ref 11.6–15.9)
MCH: 30.1 pg (ref 25.1–34.0)
MCV: 88.5 fL (ref 79.5–101.0)
MONO%: 9.7 % (ref 0.0–14.0)
NEUT#: 1.1 10*3/uL — ABNORMAL LOW (ref 1.5–6.5)
NEUT%: 33.9 % — ABNORMAL LOW (ref 38.4–76.8)
Platelets: 176 10*3/uL (ref 145–400)

## 2012-12-18 NOTE — Telephone Encounter (Signed)
Told Candice Zamora that Dr. Darrold Span spoke with Dr. Stanford Breed and he is fine with her stopping lovenox.  Candice Zamora verbalized understanding.

## 2012-12-18 NOTE — Progress Notes (Signed)
OFFICE PROGRESS NOTE   12/18/2012   Physicians:Daniel ClarkePearson; Maurice Small, MD, Ronnald Ramp   INTERVAL HISTORY:   Patient is seen, alone for visit, with planned start of adjuvant chemotherapy for ovarian cancer delayed due to acute herpes zoster.   Oncologic History Patient presented with RUQ pain in March 2014, with ED evaluation then including abdominal US not remarkable. She was then found to have pelvic mass, with MRI pelvis in [facility] system 11-11-12 showing centrally necrotic pelvic mass 11.4 x 9.1 x 7.8 cm. She was seen by Dr Yolande Jolly in [place] on 11-17-12 and taken to surgery by him at [facility] on 11-24-12. Surgery was complete debulking including TAH, BSO, omentectomy, right ureterolysis, bilateral pelvic lymphadenectomy and periaortic lymphadenectomy. There is no mention in the operative note of rupture of the ovarian capsule. 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 Pelvic washings ([id number]) negative. Assuming the ovarian capsule was disrupted after surgery, this would be pT1a pN0 for IA staging. Patient was hospitalized at [facility] x 5 days; per patient and husband, she was transfused 2 units PRBCs at [facility]. Post operative course has been unremarkable, she continues lovenox, and she is to see Dr Yolande Jolly 01-08-13  ONCOLOGIC HISTORY    Zoster rash developed left flank on 12-12-12, with patient at beach with family. She was seen and started on valacyclovir that day. Area is painful with blisters, in left ~ T11 dermatome. She had burning pain in LLE on day 1 which then resolved, left abdominal pain to midline with increased flatulence, general aching on day 2 and restless legs and arms last night; she has history of restless legs during pregnancy, had not taken benadryl.  She has not taken percocet due  to constipation concerns, and has not used ibuprofen as she is still on lovenox, planned another 8 days. She has not had fever. She has no other new or different symptoms. Tho first chemo was planned for 12-14-12, this was held and she did not take premed steroids. Remainder of 10 point Review of Systems negative.  Children have had varicella vaccine. She is instructed to avoid anyone who has not had chicken pox, pregnant women, etc until eschar. We have discussed usual course of zoster.  Objective:  Vital signs in last 24 hours:  BP 113/76  Pulse 95  Temp(Src) 97.9 F (36.6 C) (Oral)  Resp 18  Ht 5\' 6"  (1.676 m)  Wt 145 lb 4.8 oz (65.908 kg)  BMI 23.46 kg/m2  SpO2 100% Obviously uncomfortable from the zoster, but ambulatory.   HEENT:PERRLA, sclera clear, anicteric and oropharynx clear, no lesions. No alopecia LymphaticsCervical, supraclavicular, and axillary nodes normal. Resp: clear to auscultation bilaterally and normal percussion bilaterally Cardio: regular rate and rhythm GI: soft, normal BS, not distended, incision closed.Slightly tender along T11-112 dermatome with 1 cm erythematous area without vesicles Extremities: extremities normal, atraumatic, no cyanosis or edema Neuro:no sensory deficits noted Skin: confluent erythematous rash with vesicles and open blisters left flank from midline to lateral abdominal wall PAC healing well, steristrips still in place  Lab Results:  Results for orders placed in visit on 12/18/12  CBC WITH DIFFERENTIAL      Result Value Range   WBC 3.4 (*) 3.9 - 10.3 10e3/uL   NEUT# 1.1 (*) 1.5 - 6.5 10e3/uL   HGB 11.9  11.6 - 15.9 g/dL   HCT  35.0  34.8 - 46.6 %   Platelets 176  145 - 400 10e3/uL   MCV 88.5  79.5 - 101.0 fL   MCH 30.1  25.1 - 34.0 pg   MCHC 33.9  31.5 - 36.0 g/dL   RBC 8.65  7.84 - 6.96 10e6/uL   RDW 14.9 (*) 11.2 - 14.5 %   lymph# 1.8  0.9 - 3.3 10e3/uL   MONO# 0.3  0.1 - 0.9 10e3/uL   Eosinophils Absolute 0.1  0.0 - 0.5  10e3/uL   Basophils Absolute 0.0  0.0 - 0.1 10e3/uL   NEUT% 33.9 (*) 38.4 - 76.8 %   LYMPH% 53.0 (*) 14.0 - 49.7 %   MONO% 9.7  0.0 - 14.0 %   EOS% 2.9  0.0 - 7.0 %   BASO% 0.5  0.0 - 2.0 %    WBC and plt likely lower with acute viral infection Studies/Results:  No results found.  Medications: I have reviewed the patient's current medications. Valacyclovir side effects reviewed, include arthralgias tho restless legs symptoms not listed. Situation discussed directly with Dr Yolande Jolly now: will stop lovenox as she is back to full activity, which will allow her to use ibuprofen  Assessment/Plan: 1.endometroid adenocarcinoma of right ovary FIGO 3 with transitional cell features: post complete debulking of probable IA disease on 11-24-12; start of dose dense carboplatin/ taxol delayed this week due to acute zoster.  2.acute herpes zoster: continuing valacyclovir, still open blisters. Will delay start of chemo to June 17. She will stop lovenox and can use ibuprofen 600 mg that she has left from surgery, or percocet with laxative. She will be seen by provider again prior to start of chemo 3.lactose and gluten intolerance  4.PAC in  5.dysuria since surgery: UA not obviously infected, cx pending  6.hypothyroid on replacement  Patient understands that she can call at any time if needed.  Reece Packer, MD   12/18/2012, 8:27 PM

## 2012-12-19 ENCOUNTER — Other Ambulatory Visit: Payer: Self-pay | Admitting: Oncology

## 2012-12-19 DIAGNOSIS — C561 Malignant neoplasm of right ovary: Secondary | ICD-10-CM

## 2012-12-21 ENCOUNTER — Telehealth: Payer: Self-pay | Admitting: *Deleted

## 2012-12-21 ENCOUNTER — Telehealth: Payer: Self-pay | Admitting: Oncology

## 2012-12-21 ENCOUNTER — Telehealth: Payer: Self-pay

## 2012-12-21 DIAGNOSIS — C569 Malignant neoplasm of unspecified ovary: Secondary | ICD-10-CM

## 2012-12-21 NOTE — Telephone Encounter (Signed)
LM for Candice Zamora to call back to let Dr. Darrold Span know if she still has Zoster blisters as noted below and to discuss the zoster prophylaxis

## 2012-12-21 NOTE — Telephone Encounter (Signed)
Per staff message and POF I have changed scheduled appts. But I have sent message back to scheduler for 6/17 appt, MD visit to late in the day to start first time taxol/carbo.  JMW

## 2012-12-21 NOTE — Telephone Encounter (Signed)
Message copied by Lorine Bears on Mon Dec 21, 2012  4:15 PM ------      Message from: Reece Packer      Created: Sat Dec 19, 2012  3:12 PM       If still blisters on 12-21-12, please continue valacyclovir for another 5 days and check on her by phone later in week.       During chemo, with steroids and immunosuppression, she should stay on acyclovir 200 mg tid to prophylax any recurrent zoster. Will need to send this prescription, # 90 with 5 RF ------

## 2012-12-21 NOTE — Telephone Encounter (Signed)
sw. pt and advised on appts.Marland KitchenMarland Kitchenpt will get printed sched at nxt visit

## 2012-12-22 ENCOUNTER — Other Ambulatory Visit: Payer: 59 | Admitting: Lab

## 2012-12-22 ENCOUNTER — Ambulatory Visit: Payer: 59

## 2012-12-22 MED ORDER — ACYCLOVIR 200 MG PO CAPS: 200.0000 mg | ORAL_CAPSULE | Freq: Three times a day (TID) | ORAL | Status: DC

## 2012-12-22 NOTE — Telephone Encounter (Signed)
Spoke with Ms. Mahr and the blisters are now dry and crusty. She is feeling better but has waves of pain from the zoster. Discussed the acyclovir prophylaxis as noted below by Dr. Darrold Span.  Pt. Verbalized understanding.   Will send prescription to her pharmacy.

## 2012-12-27 ENCOUNTER — Other Ambulatory Visit: Payer: Self-pay | Admitting: Oncology

## 2012-12-28 ENCOUNTER — Encounter: Payer: Self-pay | Admitting: Oncology

## 2012-12-28 ENCOUNTER — Telehealth: Payer: Self-pay | Admitting: Oncology

## 2012-12-28 ENCOUNTER — Other Ambulatory Visit (HOSPITAL_BASED_OUTPATIENT_CLINIC_OR_DEPARTMENT_OTHER): Payer: 59 | Admitting: Lab

## 2012-12-28 ENCOUNTER — Ambulatory Visit (HOSPITAL_BASED_OUTPATIENT_CLINIC_OR_DEPARTMENT_OTHER): Payer: 59 | Admitting: Oncology

## 2012-12-28 VITALS — BP 112/64 | HR 83 | Temp 98.7°F | Resp 18 | Ht 66.0 in | Wt 146.3 lb

## 2012-12-28 DIAGNOSIS — C569 Malignant neoplasm of unspecified ovary: Secondary | ICD-10-CM

## 2012-12-28 DIAGNOSIS — C561 Malignant neoplasm of right ovary: Secondary | ICD-10-CM

## 2012-12-28 LAB — COMPREHENSIVE METABOLIC PANEL (CC13)
Albumin: 3.9 g/dL (ref 3.5–5.0)
Alkaline Phosphatase: 63 U/L (ref 40–150)
CO2: 28 mEq/L (ref 22–29)
Glucose: 117 mg/dl — ABNORMAL HIGH (ref 70–99)
Potassium: 3.9 mEq/L (ref 3.5–5.1)
Sodium: 144 mEq/L (ref 136–145)
Total Protein: 7.6 g/dL (ref 6.4–8.3)

## 2012-12-28 LAB — CBC WITH DIFFERENTIAL/PLATELET
Basophils Absolute: 0 10*3/uL (ref 0.0–0.1)
Eosinophils Absolute: 0.2 10*3/uL (ref 0.0–0.5)
HGB: 11.6 g/dL (ref 11.6–15.9)
MONO#: 0.3 10*3/uL (ref 0.1–0.9)
NEUT#: 2.4 10*3/uL (ref 1.5–6.5)
RDW: 15.3 % — ABNORMAL HIGH (ref 11.2–14.5)
lymph#: 1.7 10*3/uL (ref 0.9–3.3)

## 2012-12-28 NOTE — Patient Instructions (Signed)
Decadron (dexamethasone, steroid) 4mg       Five tablets = 20 mg with food 12 hours and 6 hours prior to chemotherapy, which will be ~ 12:30 tonight and ~ 6:30 AM on 6-17  Stay on acyclovir 200 mg 3 times daily  Take ibuprofen with food when you get home today, for groin and leg pain.

## 2012-12-28 NOTE — Progress Notes (Signed)
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 As baseline ANC is low normal, she will have neupogen day after each treatment at least as treatment begins.  Oncologic History  Patient presented with RUQ pain in March 2014, with ED evaluation then including abdominal US not remarkable. She was then found to have pelvic mass, with MRI pelvis in [place] system 11-11-12 showing centrally necrotic pelvic mass 11.4 x 9.1 x 7.8 cm. She was seen by Dr Yolande Jolly in [place] on 11-17-12 and taken to surgery by him at [facility] on 11-24-12. Surgery was complete debulking including TAH, BSO, omentectomy, right ureterolysis, bilateral pelvic lymphadenectomy and periaortic lymphadenectomy. There is no mention in the operative note of rupture of the ovarian capsule. 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 Pelvic washings ([id number]) negative. Assuming the ovarian capsule was disrupted after surgery, this would be pT1a pN0 for IA staging. Patient was hospitalized at [facility] x 5 days; per patient and husband, she was transfused 2 units PRBCs at [facility]. Post operative course was unremarkable until she developed acute herpes zoster left ~ T 11-12 dermatome on 12-12-12, such that start of dose dense adjuvant taxol carboplatin was delayed, now to begin 12-29-12.    Area of zoster left flank somewhat puritic but not painful, blisters resolved. New intermittent cramping pain left inguinal region and lateral left thigh down to and occasionally below knee; no clear  aggravating factors, lasts up to 10+ minutes, severe to the point that she cannot walk when symptoms occur. She has not tried ibuprofen for this or other discomfort (tho we stopped lovenox earlier than planned to allow ibuprofen). No bleeding. Bowels ok. No fever. PAC ok, steristrips still on.  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  Her children have not had any symptoms of varicella (both had varicella vaccines).  Objective:  Vital signs in last 24 hours:  BP 112/64  Pulse 83  Temp(Src) 98.7 F (37.1 C) (Oral)  Resp 18  Ht 5\' 6"  (1.676 m)  Wt 146 lb 4.8 oz (66.361 kg)  BMI 23.62 kg/m2 Weight is up 1 lb. Easily ambulatory, looks comfortable, very pleasant.   HEENT:PERRLA, sclera clear, anicteric and oropharynx clear, no lesions LymphaticsCervical, supraclavicular, and axillary nodes normal. Nothing palpable inguinal area, nontender there Resp: clear to auscultation bilaterally and normal percussion bilaterally Cardio: regular rate and rhythm GI: soft, nontender, normal bowel sounds, midline incision closed and not tender. No HSM No tenderness now to left of midline incision, where zoster discomfort extended previously. Extremities: extremities normal, atraumatic, no cyanosis or edema. No Cords. Good strength and ROM LLE. Neuro:no sensory deficits noted Skin:  Patchy erythema and minimal dry desquamation in area of previous zoster left flank, with a few areas in same dermatome lateral left abdomen.  Portacath-without erythema or tenderness, ecchymoses resolved  Lab Results:  Results for orders placed in visit on 12/28/12  CBC WITH DIFFERENTIAL      Result Value Range   WBC 4.7  3.9 - 10.3 10e3/uL   NEUT# 2.4  1.5 - 6.5 10e3/uL   HGB 11.6  11.6 - 15.9 g/dL   HCT 16.1 (*) 09.6 - 04.5 %   Platelets 189  145 - 400 10e3/uL   MCV 89.6  79.5 - 101.0 fL   MCH 30.5  25.1 -  34.0 pg   MCHC 34.1  31.5 - 36.0 g/dL   RBC 4.09  8.11 - 9.14 10e6/uL   RDW 15.3 (*) 11.2 - 14.5 %   lymph# 1.7  0.9 - 3.3 10e3/uL   MONO# 0.3  0.1 - 0.9 10e3/uL   Eosinophils Absolute 0.2  0.0 - 0.5 10e3/uL   Basophils Absolute 0.0  0.0 - 0.1 10e3/uL   NEUT% 51.4  38.4 - 76.8 %   LYMPH% 37.2  14.0 - 49.7 %   MONO% 7.1  0.0 - 14.0 %   EOS% 3.7  0.0 - 7.0 %   BASO% 0.6  0.0 - 2.0 %  COMPREHENSIVE METABOLIC PANEL (CC13)      Result Value Range   Sodium 144  136 - 145 mEq/L   Potassium 3.9  3.5 - 5.1 mEq/L   Chloride 106  98 - 107 mEq/L   CO2 28  22 - 29 mEq/L   Glucose 117 (*) 70 - 99 mg/dl   BUN 78.2  7.0 - 95.6 mg/dL   Creatinine 0.9  0.6 - 1.1 mg/dL   Total Bilirubin 2.13  0.20 - 1.20 mg/dL   Alkaline Phosphatase 63  40 - 150 U/L   AST 13  5 - 34 U/L   ALT 8  0 - 55 U/L   Total Protein 7.6  6.4 - 8.3 g/dL   Albumin 3.9  3.5 - 5.0 g/dL   Calcium 9.7  8.4 - 08.6 mg/dL     Urine cx 5-78-46 was negative.  Studies/Results: Report and PACS images of MRI pelvis reviewed in reference to the LLE pain, with note of degenerative changes in LS  Medications: I have reviewed the patient's current medications. I have asked her to take ibuprofen 600 mg with food when she returns home today, for the LLE pain. She has been given written and oral instructions for decadron 20 mg 12 hr and 6 hrs prior to chemotherapy tomorrow. If she does well, will decrease the premed decadron to one dose12 hrs prior only. She is now on acyclovir 200 mg tid which she will continue thru chemotherapy, as we anticipate immunosuppression from the chemo and from premedication steroids weekly.  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 She will have neupogen days 2,9,16 for cycle 1. I will  see her back prior to day 8 treatment. She will keep appointment with Dr Yolande Jolly on 01-08-13. 2.acute herpes zoster: much improved. Will use prophylactic acyclovir for duration of chemo 3.lactose and gluten intolerance  4.PAC in, placed at [facility]. 5. Left groin and left lateral thigh pain: possibly related to degenerative changes in LS noted on MRI pelvis, or muscle spasm related to the zoster, or other. Will try ibuprofen now and can continue that after chemo tomorrow if helpful. 6.hypothyroid on replacement   Patient follows discussion well, has had questions answered to her satisfaction and is in agreement with plan above.   Reece Packer, MD  12/28/2012, 8:40 PM

## 2012-12-29 ENCOUNTER — Ambulatory Visit (HOSPITAL_BASED_OUTPATIENT_CLINIC_OR_DEPARTMENT_OTHER): Payer: 59

## 2012-12-29 ENCOUNTER — Encounter: Payer: 59 | Admitting: Oncology

## 2012-12-29 ENCOUNTER — Other Ambulatory Visit: Payer: 59 | Admitting: Lab

## 2012-12-29 ENCOUNTER — Ambulatory Visit (HOSPITAL_COMMUNITY)
Admission: RE | Admit: 2012-12-29 | Discharge: 2012-12-29 | Disposition: A | Discharge status: Home / Self Care | Payer: 59 | Source: Ambulatory Visit | Attending: Oncology | Admitting: Oncology

## 2012-12-29 ENCOUNTER — Encounter: Payer: Self-pay | Admitting: Oncology

## 2012-12-29 ENCOUNTER — Other Ambulatory Visit: Payer: 59

## 2012-12-29 ENCOUNTER — Telehealth: Payer: Self-pay | Admitting: *Deleted

## 2012-12-29 VITALS — BP 95/59 | HR 71 | Temp 97.8°F | Resp 16

## 2012-12-29 DIAGNOSIS — R1900 Intra-abdominal and pelvic swelling, mass and lump, unspecified site: Secondary | ICD-10-CM

## 2012-12-29 DIAGNOSIS — M5137 Other intervertebral disc degeneration, lumbosacral region: Secondary | ICD-10-CM | POA: Insufficient documentation

## 2012-12-29 DIAGNOSIS — B0089 Other herpesviral infection: Secondary | ICD-10-CM

## 2012-12-29 DIAGNOSIS — M25559 Pain in unspecified hip: Secondary | ICD-10-CM | POA: Insufficient documentation

## 2012-12-29 DIAGNOSIS — M51379 Other intervertebral disc degeneration, lumbosacral region without mention of lumbar back pain or lower extremity pain: Secondary | ICD-10-CM | POA: Insufficient documentation

## 2012-12-29 DIAGNOSIS — C569 Malignant neoplasm of unspecified ovary: Secondary | ICD-10-CM

## 2012-12-29 MED ORDER — FAMOTIDINE IN NACL 20-0.9 MG/50ML-% IV SOLN
20.0000 mg | Freq: Once | INTRAVENOUS | Status: AC
  Administered 2012-12-29: 20 mg via INTRAVENOUS

## 2012-12-29 MED ORDER — HYDROCODONE-ACETAMINOPHEN 5-325 MG PO TABS
1.0000 | ORAL_TABLET | Freq: Once | ORAL | Status: AC
  Administered 2012-12-29: 1 via ORAL

## 2012-12-29 MED ORDER — DEXAMETHASONE SODIUM PHOSPHATE 20 MG/5ML IJ SOLN
20.0000 mg | Freq: Once | INTRAMUSCULAR | Status: AC
  Administered 2012-12-29: 20 mg via INTRAVENOUS

## 2012-12-29 MED ORDER — DIPHENHYDRAMINE HCL 50 MG/ML IJ SOLN
50.0000 mg | Freq: Once | INTRAMUSCULAR | Status: AC
  Administered 2012-12-29: 50 mg via INTRAVENOUS

## 2012-12-29 MED ORDER — SODIUM CHLORIDE 0.9 % IV SOLN
80.0000 mg/m2 | Freq: Once | INTRAVENOUS | Status: DC
  Filled 2012-12-29: qty 24

## 2012-12-29 MED ORDER — HEPARIN SOD (PORK) LOCK FLUSH 100 UNIT/ML IV SOLN
500.0000 [IU] | Freq: Once | INTRAVENOUS | Status: DC | PRN
  Filled 2012-12-29: qty 5

## 2012-12-29 MED ORDER — SODIUM CHLORIDE 0.9 % IV SOLN
595.2000 mg | Freq: Once | INTRAVENOUS | Status: DC
  Filled 2012-12-29: qty 60

## 2012-12-29 MED ORDER — LORAZEPAM 2 MG/ML IJ SOLN
0.5000 mg | Freq: Once | INTRAMUSCULAR | Status: AC | PRN
  Administered 2012-12-29: 0.5 mg via INTRAVENOUS

## 2012-12-29 MED ORDER — LORAZEPAM 2 MG/ML IJ SOLN
0.5000 mg | Freq: Once | INTRAMUSCULAR | Status: AC
  Administered 2012-12-29: 0.5 mg via INTRAVENOUS

## 2012-12-29 MED ORDER — PROCHLORPERAZINE EDISYLATE 5 MG/ML IJ SOLN
10.0000 mg | Freq: Once | INTRAMUSCULAR | Status: AC
  Administered 2012-12-29: 10 mg via INTRAVENOUS

## 2012-12-29 MED ORDER — SODIUM CHLORIDE 0.9 % IJ SOLN
10.0000 mL | INTRAMUSCULAR | Status: DC | PRN
  Filled 2012-12-29: qty 10

## 2012-12-29 MED ORDER — SODIUM CHLORIDE 0.9 % IV SOLN
Freq: Once | INTRAVENOUS | Status: AC
  Administered 2012-12-29: 14:00:00 via INTRAVENOUS

## 2012-12-29 NOTE — Telephone Encounter (Signed)
Per Diane pt had to be taken from tx area to be seen by LPL. She stated LPL saw the pt around 4pm today...td

## 2012-12-29 NOTE — Progress Notes (Signed)
OFFICE PROGRESS NOTE   12/29/2012   Physicians:  INTERVAL HISTORY:    Remainder of 10 point Review of Systems negative.  Objective:  Vital signs in last 24 hours:  LMP 10/25/2012    HEENT Portacath/PICC-without erythema  Lab Results:  Results for orders placed in visit on 12/28/12  CBC WITH DIFFERENTIAL      Result Value Range   WBC 4.7  3.9 - 10.3 10e3/uL   NEUT# 2.4  1.5 - 6.5 10e3/uL   HGB 11.6  11.6 - 15.9 g/dL   HCT 16.1 (*) 09.6 - 04.5 %   Platelets 189  145 - 400 10e3/uL   MCV 89.6  79.5 - 101.0 fL   MCH 30.5  25.1 - 34.0 pg   MCHC 34.1  31.5 - 36.0 g/dL   RBC 4.09  8.11 - 9.14 10e6/uL   RDW 15.3 (*) 11.2 - 14.5 %   lymph# 1.7  0.9 - 3.3 10e3/uL   MONO# 0.3  0.1 - 0.9 10e3/uL   Eosinophils Absolute 0.2  0.0 - 0.5 10e3/uL   Basophils Absolute 0.0  0.0 - 0.1 10e3/uL   NEUT% 51.4  38.4 - 76.8 %   LYMPH% 37.2  14.0 - 49.7 %   MONO% 7.1  0.0 - 14.0 %   EOS% 3.7  0.0 - 7.0 %   BASO% 0.6  0.0 - 2.0 %  CA 125      Result Value Range   CA 125 28.8  0.0 - 30.2 U/mL  COMPREHENSIVE METABOLIC PANEL (CC13)      Result Value Range   Sodium 144  136 - 145 mEq/L   Potassium 3.9  3.5 - 5.1 mEq/L   Chloride 106  98 - 107 mEq/L   CO2 28  22 - 29 mEq/L   Glucose 117 (*) 70 - 99 mg/dl   BUN 78.2  7.0 - 95.6 mg/dL   Creatinine 0.9  0.6 - 1.1 mg/dL   Total Bilirubin 2.13  0.20 - 1.20 mg/dL   Alkaline Phosphatase 63  40 - 150 U/L   AST 13  5 - 34 U/L   ALT 8  0 - 55 U/L   Total Protein 7.6  6.4 - 8.3 g/dL   Albumin 3.9  3.5 - 5.0 g/dL   Calcium 9.7  8.4 - 08.6 mg/dL     Studies/Results:  Dg Lumbar Spine 2-3 Views  12/29/2012   *RADIOLOGY REPORT*  Clinical Data: Low back pain  LUMBAR SPINE - 2-3 VIEW  Comparison: None.  Findings: Five lumbar type vertebral bodies are well visualized. Vertebral body height is well-maintained.  Disc space narrowing is noted from L3-S1.  No antero- or retrolisthesis is noted.  IMPRESSION: Mild degenerative change without acute  abnormality.   Original Report Authenticated By: Alcide Clever, M.D.   Dg Hip Complete Left  12/29/2012   *RADIOLOGY REPORT*  Clinical Data: Left hip pain  LEFT HIP - COMPLETE 2+ VIEW  Comparison: None.  Findings: No acute fracture or dislocation is identified.  A subchondral cyst is noted in adjacent to the superior acetabulum stable from prior MRI examination.  No gross soft tissue abnormality is seen.  IMPRESSION: No acute abnormality noted.   Original Report Authenticated By: Alcide Clever, M.D.    Medications: I have reviewed the patient's current medications.  Assessment/Plan:          Reece Packer, MD   12/29/2012, 7:42 PM    This encounter was created in error - please disregard.  This encounter was created in error - please disregard.

## 2012-12-29 NOTE — Progress Notes (Signed)
1300 came to infusion room c/o pain in L groin and outside of L leg from herpes outbreak. Gave 1 norco at 1300. 1330 pt reported no pain. 1452 pt with restless leg from benadryl, gave ativan 0.5 mg additional dose per Dr Darrold Span.  1505 pt still very restless and almost crying - she states the leg pain returned. 1520 2nd norco given for leg pain and Dr Darrold Span made aware. Pt very restless and changing positions frequently. 1545 still restless, crying asking if there is anything else we can do. S/w Dr Darrold Span that she needs to assess the pt. Pt taken to exam side for Dr Darrold Span to examine. Tammi RN with Dr Darrold Span aware of situation. Chemo cancelled per Dr Darrold Span. Needle left insitu and port heparin locked by Tammi about 1600.

## 2012-12-30 ENCOUNTER — Ambulatory Visit: Payer: 59

## 2012-12-30 ENCOUNTER — Other Ambulatory Visit: Payer: Self-pay | Admitting: Certified Registered Nurse Anesthetist

## 2012-12-30 ENCOUNTER — Telehealth: Payer: Self-pay | Admitting: Oncology

## 2012-12-30 ENCOUNTER — Ambulatory Visit (HOSPITAL_COMMUNITY)
Admission: RE | Admit: 2012-12-30 | Discharge: 2012-12-30 | Disposition: A | Discharge status: Home / Self Care | Payer: 59 | Source: Ambulatory Visit | Attending: Oncology | Admitting: Oncology

## 2012-12-30 ENCOUNTER — Telehealth: Payer: Self-pay | Admitting: *Deleted

## 2012-12-30 ENCOUNTER — Other Ambulatory Visit: Payer: Self-pay | Admitting: Oncology

## 2012-12-30 DIAGNOSIS — R1032 Left lower quadrant pain: Secondary | ICD-10-CM

## 2012-12-30 DIAGNOSIS — N281 Cyst of kidney, acquired: Secondary | ICD-10-CM | POA: Insufficient documentation

## 2012-12-30 DIAGNOSIS — C569 Malignant neoplasm of unspecified ovary: Secondary | ICD-10-CM

## 2012-12-30 DIAGNOSIS — R188 Other ascites: Secondary | ICD-10-CM | POA: Insufficient documentation

## 2012-12-30 DIAGNOSIS — M25559 Pain in unspecified hip: Secondary | ICD-10-CM | POA: Insufficient documentation

## 2012-12-30 DIAGNOSIS — Z90710 Acquired absence of both cervix and uterus: Secondary | ICD-10-CM | POA: Insufficient documentation

## 2012-12-30 DIAGNOSIS — K7689 Other specified diseases of liver: Secondary | ICD-10-CM | POA: Insufficient documentation

## 2012-12-30 MED ORDER — IOHEXOL 300 MG/ML  SOLN
100.0000 mL | Freq: Once | INTRAMUSCULAR | Status: AC | PRN
  Administered 2012-12-30: 100 mL via INTRAVENOUS

## 2012-12-30 MED ORDER — IOHEXOL 300 MG/ML  SOLN
50.0000 mL | Freq: Once | INTRAMUSCULAR | Status: AC | PRN
  Administered 2012-12-30: 50 mL via ORAL

## 2012-12-30 NOTE — Telephone Encounter (Signed)
Called patient to let her know Dr Darrold Span wants her to have a CT today. To stay NPO

## 2012-12-30 NOTE — Telephone Encounter (Signed)
lvm for pt with time of CT...pt aware

## 2012-12-30 NOTE — Progress Notes (Signed)
Medical Oncology  Discussed with Dr Yolande Jolly by phone now. She is out a month from surgery and this may be related to zoster, but with severity of pain he agrees with getting CT, hopefully today. Order in for CT AP now; RN trying to reach patient; this MD has spoken directly to managed care to check precertification. Request with order for radiologist to page this MD when reading scan.  Ila Mcgill, MD

## 2012-12-31 ENCOUNTER — Telehealth: Payer: Self-pay

## 2012-12-31 ENCOUNTER — Other Ambulatory Visit: Payer: Self-pay | Admitting: Oncology

## 2012-12-31 ENCOUNTER — Other Ambulatory Visit: Payer: Self-pay | Admitting: Certified Registered Nurse Anesthetist

## 2012-12-31 NOTE — Telephone Encounter (Signed)
Discussed CT findings as noted below by Dr. Darrold Span. Candice Zamora states that she took motrin 600 mg and the  percocet together  yesterday  and it was not effective.  Bed rest is the only intervention that relieves the episodes of pain in left hip area.  She will try the flexeril.  She is to follow up with Dr. Darrold Span  on 01-04-13 and she would rather wait for treatment on 01-05-13 to see if the pain improves as it might be difficult to be in a recliner chair for 4-5 hours or treatment.

## 2012-12-31 NOTE — Telephone Encounter (Signed)
Message copied by Lorine Bears on Thu Dec 31, 2012 12:47 PM ------      Message from: Reece Packer      Created: Wed Dec 30, 2012  8:25 PM       Please let her know nothing that would cause the pain seen on CT, which Dr Yolande Jolly also reviewed. We were not able to reach her on #s listed this pm. Would continue prn percocet, NSAID and ok also to call in flexeril 10 mg q 12 hrs prn #20 if it still seems to be muscle spasm. Please reschedule long taxol/ carbo to Friday or early next week and let me know date to get orders in. Be sure she has enough percocet. Remind her that she may need laxative with pain meds.      Cc LA, TH ------

## 2012-12-31 NOTE — Telephone Encounter (Signed)
Patient's appointment for treatment on 01-05-13 was extended to 5 hours to accommodate the long taxol infusion.  A bed was able to be scheduled as this  Might help her  tolerate the infusion time if her left hip pain has not resolved. Pt. notified of this appointment with bed accomodation.

## 2013-01-01 ENCOUNTER — Telehealth: Payer: Self-pay

## 2013-01-01 DIAGNOSIS — C569 Malignant neoplasm of unspecified ovary: Secondary | ICD-10-CM

## 2013-01-01 MED ORDER — CYCLOBENZAPRINE HCL 10 MG PO TABS: 10.0000 mg | ORAL_TABLET | Freq: Three times a day (TID) | ORAL | Status: DC | PRN

## 2013-01-01 NOTE — Telephone Encounter (Signed)
Spoke with Candice Zamora and the flexeril has helped with the pain.  She felt the left hip area unwind yesterday and that was painful.  Pain is more of a 3-4/10 now.  She will continue to use the flexeril q 12 hours  until she is pain free. Next visit is 01-04-13.

## 2013-01-01 NOTE — Telephone Encounter (Signed)
Message copied by Lorine Bears on Fri Jan 01, 2013  1:39 PM ------      Message from: Reece Packer      Created: Thu Dec 31, 2012  4:01 PM       RN please check on her by phone 6-20 to see if flexeril has helped. We may need to try Lyrica for neuropathic pain if not.            Cc LA, TH       ------

## 2013-01-01 NOTE — Telephone Encounter (Signed)
Told Candice Zamora that Dr. Darrold Span said that she can use the flexeril every 8 hrs. If needed.  Pt. Verbalized understanding.  She has 18 tabs left so she has enough until her appointment on 01-04-13.

## 2013-01-03 ENCOUNTER — Encounter: Payer: Self-pay | Admitting: Oncology

## 2013-01-03 NOTE — Progress Notes (Signed)
Medical Oncology  Note of Information for 12-29-12 First chemotherapy administration 12-29-12 stopped after patient unable to tolerate discomfort in left groin and upper lateral left thigh despite interventions at office for over 2 hours. Plain xrays LS spine and left hip not remarkable. Further evaluation and intervention in process prior to rescheduling chemo.  Ila Mcgill, MD

## 2013-01-04 ENCOUNTER — Other Ambulatory Visit: Payer: 59 | Admitting: Lab

## 2013-01-04 ENCOUNTER — Other Ambulatory Visit (HOSPITAL_BASED_OUTPATIENT_CLINIC_OR_DEPARTMENT_OTHER): Payer: 59 | Admitting: Lab

## 2013-01-04 ENCOUNTER — Encounter: Payer: Self-pay | Admitting: Oncology

## 2013-01-04 ENCOUNTER — Ambulatory Visit (HOSPITAL_BASED_OUTPATIENT_CLINIC_OR_DEPARTMENT_OTHER): Payer: 59 | Admitting: Oncology

## 2013-01-04 VITALS — BP 93/56 | HR 85 | Temp 98.3°F | Resp 17 | Ht 66.0 in | Wt 146.1 lb

## 2013-01-04 DIAGNOSIS — C569 Malignant neoplasm of unspecified ovary: Secondary | ICD-10-CM

## 2013-01-04 DIAGNOSIS — C561 Malignant neoplasm of right ovary: Secondary | ICD-10-CM

## 2013-01-04 LAB — CBC WITH DIFFERENTIAL/PLATELET
BASO%: 0.4 % (ref 0.0–2.0)
EOS%: 3.7 % (ref 0.0–7.0)
HCT: 35.5 % (ref 34.8–46.6)
HGB: 12 g/dL (ref 11.6–15.9)
MCH: 30.1 pg (ref 25.1–34.0)
MCV: 89.3 fL (ref 79.5–101.0)
MONO%: 7.8 % (ref 0.0–14.0)
RBC: 3.98 10*6/uL (ref 3.70–5.45)
RDW: 15.1 % — ABNORMAL HIGH (ref 11.2–14.5)
WBC: 7.5 10*3/uL (ref 3.9–10.3)

## 2013-01-04 MED ORDER — CYCLOBENZAPRINE HCL 10 MG PO TABS: 10.0000 mg | ORAL_TABLET | Freq: Three times a day (TID) | ORAL | Status: DC | PRN

## 2013-01-04 NOTE — Progress Notes (Signed)
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 Plain X rays 12-29-12 and CT pelvis 12-30-12 showed no etiology for the pain other than the zoster; CT also reviewed by Dr Yolande Jolly. She has been better past few days using flexeril every 12 hours, which does not make her drowsy. The pain is still present when she walks distances, now mostly left lateral thigh from hip to knee. Percocet + ibuprofen did not help. She has PAC, this placed at [facility]. Note that when we attempted chemotherapy on 12-29-12, she had extreme restless legs after benadryl 50 mg as well as compazine premed; she has HA with zofran. I will decrease benadryl to 25 mg premed for taxol and will try phenergan instead of compazine at office.  Oncologic History  Patient presented with RUQ pain in March 2014, with ED evaluation then including abdominal US not remarkable. She was then found to have pelvic mass, with MRI pelvis in [place] system 11-11-12 showing centrally necrotic pelvic mass 11.4 x 9.1 x 7.8 cm. She was seen by Dr Yolande Jolly in [place] on 11-17-12 and taken to surgery by him at [facility] on 11-24-12. Surgery was complete debulking including TAH, BSO, omentectomy, right ureterolysis, bilateral pelvic lymphadenectomy and periaortic lymphadenectomy. There is no mention in the operative note of rupture of the ovarian capsule. 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 Pelvic washings ([id number]) negative. Assuming the ovarian capsule was disrupted after surgery, this  would be pT1a pN0 for IA staging. Patient was hospitalized at [facility] x 5 days; per patient and husband, she was transfused 2 units PRBCs at [facility]. Post operative course was unremarkable until she developed acute herpes zoster left ~ T 11-12 dermatome on 12-12-12, then severe pain left inguinal region and upper lateral left thigh such that start of dose dense adjuvant taxol carboplatin was delayed x2, now to begin 01-05-13. As WBC and ANC have been low normal, will give neupogen days 2,9,16 (tho need to follow counts with this as the lower range may have been related to acute zoster).  Hot flashes at hs with interrupted sleep. Mild itching at zoster rash, no pain there. LLE pain as above. No fever, no other symptoms of infection. Bowels and bladder ok. No respiratory symptoms. Remainder of 10 point Review of Systems negative.  Objective:  Vital signs in last 24 hours:  BP 93/56  Pulse 85  Temp(Src) 98.3 F (36.8 C) (Oral)  Resp 17  Ht 5\' 6"  (1.676 m)  Wt 146 lb 1.6 oz (66.271 kg)  BMI 23.59 kg/m2  SpO2 100%  LMP 10/25/2012  Weight stable. Ambulatory without difficulty in exam room. Respirations not labored RA.  HEENT:PERRLA, sclera clear, anicteric and oropharynx clear, no lesions LymphaticsCervical, supraclavicular, and axillary nodes normal. Resp: clear to auscultation bilaterally and normal percussion bilaterally Cardio: regular rate and rhythm GI: soft, non-tender; bowel sounds normal; no masses,  no organomegaly Extremities: extremities normal, atraumatic, no cyanosis or edema Neuro:no sensory deficits noted Skin: zoster rash ~ T 11-12 only mild erythema now PAC site unremarkable  Lab Results:  Results for orders placed  in visit on 01/04/13  CBC WITH DIFFERENTIAL      Result Value Range   WBC 7.5  3.9 - 10.3 10e3/uL   NEUT# 5.1  1.5 - 6.5 10e3/uL   HGB 12.0  11.6 - 15.9 g/dL   HCT 16.1  09.6 - 04.5 %   Platelets 194  145 - 400 10e3/uL   MCV 89.3  79.5 - 101.0 fL   MCH 30.1   25.1 - 34.0 pg   MCHC 33.7  31.5 - 36.0 g/dL   RBC 4.09  8.11 - 9.14 10e6/uL   RDW 15.1 (*) 11.2 - 14.5 %   lymph# 1.6  0.9 - 3.3 10e3/uL   MONO# 0.6  0.1 - 0.9 10e3/uL   Eosinophils Absolute 0.3  0.0 - 0.5 10e3/uL   Basophils Absolute 0.0  0.0 - 0.1 10e3/uL   NEUT% 67.4  38.4 - 76.8 %   LYMPH% 20.7  14.0 - 49.7 %   MONO% 7.8  0.0 - 14.0 %   EOS% 3.7  0.0 - 7.0 %   BASO% 0.4  0.0 - 2.0 %    Last CMET 12-28-12 Studies/Results:  CT ABDOMEN AND PELVIS WITH CONTRAST 12-30-12 Technique: Multidetector CT imaging of the abdomen and pelvis was  performed following the standard protocol during bolus  administration of intravenous contrast.  Contrast: 100 ml Omnipaque-300 IV  Comparison: MR pelvis dated 11/10/2012.  Findings: 1.3 x 1.7 cyst in the anterior segment right hepatic lobe  (series 2/image 11). Two additional hypoenhancing lesions in the  posterior right hepatic dome (series 2/image 10) and lateral  segment hepatic lobe (series 2/image 11), too small to  characterize.  Spleen, pancreas, and adrenal glands within normal limits.  Gallbladder is unremarkable. No intrahepatic or extrahepatic  ductal dilatation.  Left renal sinus cysts. Kidneys are otherwise unremarkable. No  hydronephrosis.  No evidence of bowel obstruction. Normal appendix.  No evidence of abdominal aortic aneurysm.  Small volume pelvic ascites. No gross peritoneal disease is seen.  No suspicious abdominopelvic lymphadenopathy.  Status post hysterectomy. 4.1 x 3.4 cm fluid density lesion in the  left adnexa (series 2/image 61), possibly reflecting a postsurgical  seroma or lymphocele.  Bladder is within normal limits.  Postsurgical changes in the midline anterior abdominal wall.  Mild degenerative changes of the visualized thoracolumbar spine.  IMPRESSION:  Status post hysterectomy.  4.1 x 3.4 cm fluid density lesion in the left adnexa, possibly  reflecting a postsurgical seroma or lymphocele.  Small volume  pelvic ascites. No gross peritoneal disease is seen.  Attention on follow-up is suggested.  These results were called by telephone on 12/30/2012 at 1535 hours  to Dr Jama Flavors, who verbally acknowledged these results.  Medications: I have reviewed the patient's current medications.will refill flexeril. She understands premed decadron dosing of 20 mg 12 hrs and 6 hrs prior to taxol. She continues prophylactic acyclovir, which she should continue for duration of chemotherapy due to immunosuppression from chemo and necessary steroids. Will consider decrease in premed decadron to 20 mg 12 hrs prior to Rx if no reaction problems. NOTE HA with zofran and extreme restless legs with benadryl 50 mg +/- compazine.  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 Will start day 1 cycle 1 on 01-05-13. She will have neupogen days 2,9,16 for cycle 1.  I will see her back prior to day 8 treatment. She will keep appointment with Dr Yolande Jolly on 01-08-13.  2.acute herpes zoster: improved. Will use prophylactic acyclovir for duration of chemo  3. Left groin and left lateral thigh pain: likely also related to the zoster, tho some mild degenerative changes on imaging; nothing obvious on CT pelvis to cause this. Better with prn flexeril for now. 6.hypothyroid on replacement 3.lactose and gluten intolerance  4.PAC in, placed at [facility].    Patient is in agreement with plan above.  Marlynn Hinckley P, MD   01/04/2013, 4:45 PM

## 2013-01-05 ENCOUNTER — Ambulatory Visit (HOSPITAL_BASED_OUTPATIENT_CLINIC_OR_DEPARTMENT_OTHER): Payer: 59

## 2013-01-05 ENCOUNTER — Telehealth: Payer: Self-pay | Admitting: Oncology

## 2013-01-05 ENCOUNTER — Other Ambulatory Visit: Payer: Self-pay | Admitting: Oncology

## 2013-01-05 ENCOUNTER — Telehealth: Payer: Self-pay | Admitting: *Deleted

## 2013-01-05 ENCOUNTER — Other Ambulatory Visit: Payer: 59 | Admitting: Lab

## 2013-01-05 VITALS — BP 97/65 | HR 87 | Temp 97.7°F | Resp 20

## 2013-01-05 DIAGNOSIS — C561 Malignant neoplasm of right ovary: Secondary | ICD-10-CM

## 2013-01-05 DIAGNOSIS — C569 Malignant neoplasm of unspecified ovary: Secondary | ICD-10-CM

## 2013-01-05 DIAGNOSIS — Z5111 Encounter for antineoplastic chemotherapy: Secondary | ICD-10-CM

## 2013-01-05 MED ORDER — SODIUM CHLORIDE 0.9 % IV SOLN
640.0000 mg | Freq: Once | INTRAVENOUS | Status: AC
  Administered 2013-01-05: 640 mg via INTRAVENOUS
  Filled 2013-01-05: qty 64

## 2013-01-05 MED ORDER — SODIUM CHLORIDE 0.9 % IV SOLN
80.0000 mg/m2 | Freq: Once | INTRAVENOUS | Status: AC
  Administered 2013-01-05: 138 mg via INTRAVENOUS
  Filled 2013-01-05: qty 23

## 2013-01-05 MED ORDER — PROMETHAZINE HCL 25 MG/ML IJ SOLN
25.0000 mg | Freq: Once | INTRAMUSCULAR | Status: AC
  Administered 2013-01-05: 25 mg via INTRAVENOUS
  Filled 2013-01-05: qty 1

## 2013-01-05 MED ORDER — SODIUM CHLORIDE 0.9 % IJ SOLN
10.0000 mL | INTRAMUSCULAR | Status: DC | PRN
  Administered 2013-01-05: 10 mL
  Filled 2013-01-05: qty 10

## 2013-01-05 MED ORDER — SODIUM CHLORIDE 0.9 % IV SOLN
Freq: Once | INTRAVENOUS | Status: AC
  Administered 2013-01-05: 11:00:00 via INTRAVENOUS

## 2013-01-05 MED ORDER — LORAZEPAM 1 MG PO TABS
1.0000 mg | ORAL_TABLET | Freq: Once | ORAL | Status: AC
  Administered 2013-01-05: 1 mg via SUBLINGUAL

## 2013-01-05 MED ORDER — HEPARIN SOD (PORK) LOCK FLUSH 100 UNIT/ML IV SOLN
500.0000 [IU] | Freq: Once | INTRAVENOUS | Status: AC | PRN
  Administered 2013-01-05: 500 [IU]
  Filled 2013-01-05: qty 5

## 2013-01-05 MED ORDER — FAMOTIDINE IN NACL 20-0.9 MG/50ML-% IV SOLN
20.0000 mg | Freq: Once | INTRAVENOUS | Status: AC
  Administered 2013-01-05: 20 mg via INTRAVENOUS

## 2013-01-05 MED ORDER — DIPHENHYDRAMINE HCL 50 MG/ML IJ SOLN
25.0000 mg | Freq: Once | INTRAMUSCULAR | Status: AC
  Administered 2013-01-05: 25 mg via INTRAVENOUS

## 2013-01-05 MED ORDER — DEXAMETHASONE SODIUM PHOSPHATE 20 MG/5ML IJ SOLN
20.0000 mg | Freq: Once | INTRAMUSCULAR | Status: AC
  Administered 2013-01-05: 20 mg via INTRAVENOUS

## 2013-01-05 NOTE — Progress Notes (Signed)
Pt c/o and experienced upper body jerking due to Benadryl 25 mg.  Dr. Darrold Span notified.  Order received to give Ativan 1 mg po Sublingual.  Explanations given to pt and husband. Both voiced understanding. Pt tolerated and completed Taxol infusion without problems.

## 2013-01-05 NOTE — Patient Instructions (Signed)
[facility] Discharge Instructions for Patients Receiving Chemotherapy  Today you received the following chemotherapy agents :  Taxol, Carboplatin.  To help prevent nausea and vomiting after your treatment, we encourage you to take your nausea medication as instructed by your physician.  DO NOT Eat spicy nor greasy foods.  DO Drink lots of fluids.    If you develop nausea and vomiting that is not controlled by your nausea medication, call the clinic.   BELOW ARE SYMPTOMS THAT SHOULD BE REPORTED IMMEDIATELY:  *FEVER GREATER THAN 100.5 F  *CHILLS WITH OR WITHOUT FEVER  NAUSEA AND VOMITING THAT IS NOT CONTROLLED WITH YOUR NAUSEA MEDICATION  *UNUSUAL SHORTNESS OF BREATH  *UNUSUAL BRUISING OR BLEEDING  TENDERNESS IN MOUTH AND THROAT WITH OR WITHOUT PRESENCE OF ULCERS  *URINARY PROBLEMS  *BOWEL PROBLEMS  UNUSUAL RASH Items with * indicate a potential emergency and should be followed up as soon as possible.  Feel free to call the clinic you have any questions or concerns. The clinic phone number is [phone].

## 2013-01-05 NOTE — Telephone Encounter (Signed)
Per staff message and POF I have scheduled appts.  JMW  

## 2013-01-05 NOTE — Telephone Encounter (Signed)
s.w pt to pick up updated sched at nxt visit...pt ok and aware

## 2013-01-06 ENCOUNTER — Ambulatory Visit (HOSPITAL_BASED_OUTPATIENT_CLINIC_OR_DEPARTMENT_OTHER): Payer: 59

## 2013-01-06 ENCOUNTER — Other Ambulatory Visit: Payer: Self-pay | Admitting: Oncology

## 2013-01-06 VITALS — BP 111/71 | HR 87 | Temp 97.1°F | Resp 18

## 2013-01-06 DIAGNOSIS — C569 Malignant neoplasm of unspecified ovary: Secondary | ICD-10-CM

## 2013-01-06 DIAGNOSIS — Z5189 Encounter for other specified aftercare: Secondary | ICD-10-CM

## 2013-01-06 DIAGNOSIS — C561 Malignant neoplasm of right ovary: Secondary | ICD-10-CM

## 2013-01-06 MED ORDER — FILGRASTIM 300 MCG/0.5ML IJ SOLN
300.0000 ug | Freq: Once | INTRAMUSCULAR | Status: AC
  Administered 2013-01-06: 300 ug via SUBCUTANEOUS
  Filled 2013-01-06: qty 0.5

## 2013-01-06 NOTE — Progress Notes (Signed)
Patient here for Neupogen injection after first chemo treatment on 01/05/13.  She tolerated the chemo treatment without any complaints.

## 2013-01-06 NOTE — Patient Instructions (Signed)
Call MD for problems or concerns 

## 2013-01-07 ENCOUNTER — Ambulatory Visit: Payer: 59

## 2013-01-08 ENCOUNTER — Encounter: Payer: Self-pay | Admitting: Gynecology

## 2013-01-08 ENCOUNTER — Ambulatory Visit: Payer: 59 | Attending: Gynecology | Admitting: Gynecology

## 2013-01-08 VITALS — BP 98/58 | HR 70 | Temp 98.0°F | Resp 20 | Ht 66.5 in | Wt 145.5 lb

## 2013-01-08 DIAGNOSIS — E039 Hypothyroidism, unspecified: Secondary | ICD-10-CM | POA: Insufficient documentation

## 2013-01-08 DIAGNOSIS — Z79899 Other long term (current) drug therapy: Secondary | ICD-10-CM | POA: Insufficient documentation

## 2013-01-08 DIAGNOSIS — Z9079 Acquired absence of other genital organ(s): Secondary | ICD-10-CM | POA: Insufficient documentation

## 2013-01-08 DIAGNOSIS — C569 Malignant neoplasm of unspecified ovary: Secondary | ICD-10-CM

## 2013-01-08 DIAGNOSIS — Z90710 Acquired absence of both cervix and uterus: Secondary | ICD-10-CM | POA: Insufficient documentation

## 2013-01-08 DIAGNOSIS — M79609 Pain in unspecified limb: Secondary | ICD-10-CM | POA: Insufficient documentation

## 2013-01-08 NOTE — Patient Instructions (Signed)
You may return to full levels of activity. I will plan on seeing her back again on 02/16/2013.

## 2013-01-08 NOTE — Progress Notes (Signed)
Consult Note: Gyn-Onc   Candice Zamora 47 y.o. female  Chief Complaint  Patient presents with  . 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 She has recovered from surgery is given the okay to return to full levels of activity. The pain of her left leg pain is of uncertain etiology. I recommend that she be evaluated by a neurologist given the fact the CT scan appears normal. Plan: The patient may return to full levels of activity. She'll continue chemotherapy. She return to see me after her third cycle of chemotherapy on 02/16/2013. We'll last Dr. Precious Reel office to arrange the patient evaluated by a neurologist with regard to her left leg pain. Interval history: Since hospital discharge the patient has continued to recover. However she developed a episode of herpes zoster in the left flank. Subsequently she's developed pain extending down her left leg which is aggravated by walking. A followup CT scan shows no pelvic abnormality or sequelae from surgery. The patient has been taking Flexeril the with only mild relief. Appetite is good she has no GI or GU symptoms. Her abdominal incision was without pain.     HPI:  5 her old white married female gravida 4 para 2 seen in consultation request of Dr.Marie-Lyne Seymour Bars regarding management of a newly diagnosed complex pelvic mass. The patient has had several months of lower nominal pain and initially underwent a pelvic ultrasound on April 28 that showed a 10.8 x 8.7 x 9.2 cm heterogeneous pelvic mass. Small fibroids were also noted. The adnexa were poorly seen. Subsequently she underwent a MRI of the pelvis which demonstrated a centrally necrotic pelvic mass which may arise from the posterior uterus or the right adnexa. She had a normal-appearing left ovary moderate ascites and no evidence of adenopathy. CA 125 is 226 units per mL CEA 1.5 (normal)OVA1 was 9.4 (upper limits of normal for  premenopausal patient is 5) Patient underwent exploratory laparotomy, TAH, BSO, omentectomy, pelvic and aortic lymphadenectomy at [facility] on 11/24/2012. She had 11 x 9 x 7 cm right adnexal mass which was densely adherent to the right pelvic sidewall (stage IIB and (. 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 All other biopsies including pelvic and para-aortic lymphadenectomy were negative. Review of Systems:10 point review of systems is negative except as noted in interval history.   Vitals: Blood pressure 98/58, pulse 70, temperature 98 F (36.7 C), temperature source Oral, resp. rate 20, height 5' 6.5" (1.689 m), weight 65.998 kg (145 lb 8 oz), last menstrual period 10/25/2012.  Physical Exam: General : The patient is a healthy woman in no acute distress.  HEENT: normocephalic, extraoccular movements normal; neck is supple without thyromegally  Lynphnodes: Supraclavicular and inguinal nodes not enlarged  Abdomen: Soft, non-tender, no ascites, no organomegally, there is a lower abdominal mass extending nearly to the umbilicus., no hernias, incision is healing well  Pelvic:  EGBUS: Normal female  Vagina: Normal, no lesions , the cuff is healing well. Urethra and Bladder: Normal, non-tender  Cervix: normal   Uterus: I believe a separate the uterus from the mass. It feels normal in size. A high the uterus is a large mass filling the entire pelvis somewhat nodular.     Rectal: normal sphincter ton, confirmed pelvic mass., no blood  Lower extremities: No edema or varicosities. Normal range of motion      Allergies  Allergen Reactions  . Zofran (Ondansetron Hcl) Other (See  Comments)    headache  . Sulfa Antibiotics Rash    Past Medical History  Diagnosis Date  . Ectopic pregnancy   . History of chicken pox   . Pelvic mass in female   . Abdominal pain   . Infertility, female   . Hemorrhoids   . Goiter   . Hypothyroidism   . Dermoid cyst     Past Surgical History   Procedure Laterality Date  . Laparoscopy for ectopic pregnancy      10 years ago    Current Outpatient Prescriptions  Medication Sig Dispense Refill  . acyclovir (ZOVIRAX) 200 MG capsule Take 1 capsule (200 mg total) by mouth 3 (three) times daily.  90 capsule  5  . cyclobenzaprine (FLEXERIL) 10 MG tablet Take 1 tablet (10 mg total) by mouth 3 (three) times daily as needed for muscle spasms.  20 tablet  1  . dexamethasone (DECADRON) 4 MG tablet 5 tablets (20 mg) with food 12 hours and 6 hours prior to chemo  30 tablet  0  . ibuprofen (ADVIL,MOTRIN) 600 MG tablet       . levothyroxine (SYNTHROID, LEVOTHROID) 50 MCG tablet Take 50 mcg by mouth daily.      Marland Kitchen lidocaine-prilocaine (EMLA) cream Apply topically as needed. To port prior to access  30 g  1  . LORazepam (ATIVAN) 1 MG tablet 1/2- 1 tablet by mouth or under tongue every 4-6 hours as needed for nausea. Will make you drowsy  20 tablet  0  . oxyCODONE-acetaminophen (PERCOCET/ROXICET) 5-325 MG per tablet Take 1 tablet by mouth every 6 (six) hours as needed for pain.  15 tablet  0  . prochlorperazine (COMPAZINE) 10 MG tablet Take 1 tablet (10 mg total) by mouth every 6 (six) hours as needed. For nausea  30 tablet  1   No current facility-administered medications for this visit.    History   Social History  . Marital Status: Married    Spouse Name: N/A    Number of Children: N/A  . Years of Education: N/A   Occupational History  . Not on file.   Social History Main Topics  . Smoking status: Never Smoker   . Smokeless tobacco: Not on file  . Alcohol Use: No  . Drug Use: No  . Sexually Active: Not on file   Other Topics Concern  . Not on file   Social History Narrative  . No narrative on file    Family History  Problem Relation Age of Onset  . Hypothyroidism Mother   . Heart disease Mother   . Uterine cancer Mother   . Heart disease Father   . Heart attack Father   . Hypothyroidism Sister   . Breast cancer Maternal  Aunt   . Diabetes Maternal Grandfather   . Diabetes Paternal Grandmother       Jeannette Corpus, MD 01/08/2013, 10:01 AM

## 2013-01-10 ENCOUNTER — Other Ambulatory Visit: Payer: Self-pay | Admitting: Oncology

## 2013-01-11 ENCOUNTER — Encounter: Payer: Self-pay | Admitting: Oncology

## 2013-01-11 ENCOUNTER — Other Ambulatory Visit: Payer: 59 | Admitting: Lab

## 2013-01-11 ENCOUNTER — Ambulatory Visit (HOSPITAL_BASED_OUTPATIENT_CLINIC_OR_DEPARTMENT_OTHER): Payer: 59 | Admitting: Oncology

## 2013-01-11 ENCOUNTER — Telehealth: Payer: Self-pay | Admitting: Oncology

## 2013-01-11 ENCOUNTER — Other Ambulatory Visit: Payer: Self-pay | Admitting: *Deleted

## 2013-01-11 ENCOUNTER — Other Ambulatory Visit (HOSPITAL_BASED_OUTPATIENT_CLINIC_OR_DEPARTMENT_OTHER): Payer: 59 | Admitting: Lab

## 2013-01-11 ENCOUNTER — Telehealth: Payer: Self-pay | Admitting: *Deleted

## 2013-01-11 VITALS — BP 110/59 | HR 106 | Temp 98.0°F | Resp 20 | Ht 66.5 in | Wt 146.0 lb

## 2013-01-11 DIAGNOSIS — C561 Malignant neoplasm of right ovary: Secondary | ICD-10-CM

## 2013-01-11 DIAGNOSIS — C569 Malignant neoplasm of unspecified ovary: Secondary | ICD-10-CM

## 2013-01-11 LAB — CBC WITH DIFFERENTIAL/PLATELET
Eosinophils Absolute: 0.2 10*3/uL (ref 0.0–0.5)
MONO#: 0.2 10*3/uL (ref 0.1–0.9)
MONO%: 5.2 % (ref 0.0–14.0)
NEUT#: 3 10*3/uL (ref 1.5–6.5)
RBC: 3.97 10*6/uL (ref 3.70–5.45)
RDW: 14.7 % — ABNORMAL HIGH (ref 11.2–14.5)
WBC: 4.3 10*3/uL (ref 3.9–10.3)
lymph#: 0.9 10*3/uL (ref 0.9–3.3)

## 2013-01-11 MED ORDER — PROMETHAZINE HCL 25 MG PO TABS: 25.0000 mg | ORAL_TABLET | Freq: Four times a day (QID) | ORAL | Status: DC | PRN

## 2013-01-11 MED ORDER — FLUCONAZOLE 100 MG PO TABS: 100.0000 mg | ORAL_TABLET | Freq: Every day | ORAL | Status: DC

## 2013-01-11 NOTE — Progress Notes (Signed)
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 She was extremely restless even with decrease in IV benadryl premed to 25 mg, but otherwise has done fairly well with the first treatment. LLE pain thought related to zoster has improved over past 2-3 days. She has PAC.  Oncologic History  Patient presented with RUQ pain in March 2014, with ED evaluation then including abdominal US not remarkable. She was then found to have pelvic mass, with MRI pelvis in [facility] system 11-11-12 showing centrally necrotic pelvic mass 11.4 x 9.1 x 7.8 cm. CA 125 by Dr Seymour Bars 11-13-2012 was 226.4. She saw Dr Yolande Jolly in [place] on 11-17-12 and went to surgery by him at [facility] on 11-24-12. Surgery was complete debulking including TAH, BSO, omentectomy, right ureterolysis, bilateral pelvic lymphadenectomy and periaortic lymphadenectomy. There is no mention in the operative note of rupture of the ovarian capsule. 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 Pelvic washings ([id number]) negative. Assuming the ovarian capsule was disrupted after surgery, this would be pT1a pN0 for IA staging. Patient was hospitalized at [facility] x 5 days; per patient and husband, she was transfused 2 units PRBCs at [facility]. Post operative course was unremarkable until she developed acute herpes zoster left ~ T 11-12 dermatome on 12-12-12, then severe pain left inguinal region and upper lateral left thigh such that start of dose dense adjuvant taxol carboplatin was delayed x2 prior to beginning 01-05-13. As WBC and  ANC have been low normal, will give neupogen days 2,9,16 (tho need to follow counts with this as the lower range may have been related to acute zoster).   Some queasiness without frank nausea, used ativan once and compazine x2, may have felt jittery with the compazine. Is drinking water. Bowels ok. No abdominal or pelvic pain, no bleeding. Hot flashes waking her at least every hour at night, very tired with this. No bladder symptoms. Had 2 episodes of LLE pain after she tried to shop on 6-28, then none on 6-29 or so far today. Mouth "feels dry" Remainder of 10 point Review of Systems negative.  Objective:  Vital signs in last 24 hours:  BP 110/59  Pulse 106  Temp(Src) 98 F (36.7 C) (Oral)  Resp 20  Ht 5' 6.5" (1.689 m)  Wt 146 lb (66.225 kg)  BMI 23.21 kg/m2  LMP 10/25/2012  Weight stable. Alert, looks tired but NAD. Drinking water. No alopecia  HEENT:PERRLA, sclera clear, anicteric and oropharynx clear, no lesions. Tongue coated consistent with candida, none on buccal mucosa or posterior pharynx LymphaticsCervical, supraclavicular, and axillary nodes normal. Resp: clear to auscultation bilaterally and normal percussion bilaterally Cardio: regular rate and rhythm GI: soft, non-tender; bowel sounds normal; no masses,  no organomegaly Extremities: extremities normal, atraumatic, no cyanosis or edema Neuro:no sensory deficits noted Skin: zoster rash left flank gradually improving erythema Portacath-without erythema or tenderness  Lab Results:  Results for orders placed in visit on 01/11/13  CBC WITH DIFFERENTIAL      Result Value Range   WBC 4.3  3.9 - 10.3 10e3/uL   NEUT# 3.0  1.5 -  6.5 10e3/uL   HGB 12.0  11.6 - 15.9 g/dL   HCT 82.9  56.2 - 13.0 %   Platelets 166  145 - 400 10e3/uL   MCV 90.0  79.5 - 101.0 fL   MCH 30.3  25.1 - 34.0 pg   MCHC 33.6  31.5 - 36.0 g/dL   RBC 8.65  7.84 - 6.96 10e6/uL   RDW 14.7 (*) 11.2 - 14.5 %   lymph# 0.9  0.9 - 3.3 10e3/uL   MONO# 0.2   0.1 - 0.9 10e3/uL   Eosinophils Absolute 0.2  0.0 - 0.5 10e3/uL   Basophils Absolute 0.0  0.0 - 0.1 10e3/uL   NEUT% 70.7  38.4 - 76.8 %   LYMPH% 20.4  14.0 - 49.7 %   MONO% 5.2  0.0 - 14.0 %   EOS% 3.5  0.0 - 7.0 %   BASO% 0.2  0.0 - 2.0 %     Studies/Results:  No results found.  Medications: I have reviewed the patient's current medications. Will have phenergan available in case she can tell that she is not tolerating compazine; note HA with zofran. Diflucan 100 mg daily x 7 for oral thrush. Will decrease premed decadron to 20 mg 12 hrs prior to taxol + the IV decadron. Will change premed benadryl to 25 mg po in hopes that this will lessen the restlessness; prn ativan added to premeds for restlessness. She will try Vitamin E 400 mg bid or 800 mg at hs for hot flashes; she may need to discuss short term estrogen with Dr Yolande Jolly Note she continues q 8 hr flexeril for the LLE pain. We have discussed gradually tapering this when the leg pain improves.  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 She will have day 8 cycle 1 taxol on 01-12-13 with neupogen on 7-2, then day 15 cycle 1 taxol on 7-8 as long as ANC >=1.5 and plt .=100k, neupogen on 01-20-13. She will have CMET and CA 125 drawn from [facility] in infusion on 01-19-13. She will see provider on ~ 7-14 prior to day 1 cycle 2 carbo + taxol on 7-15 and neupogen on 01-27-13. 2.acute herpes zoster: improved. Will use prophylactic acyclovir for duration of chemo  3. Left groin and left lateral thigh pain: likely also related to the zoster, tho some mild degenerative changes on imaging; nothing obvious on CT pelvis to cause this. Better with prn flexeril for now.  6.hypothyroid on replacement  3.lactose and gluten intolerance  4.PAC  in, placed at [facility]  5.oral candida: diflucan as above 6.intolerance to zofran with HA, severe restless legs with benadryl, possible intolerance to compazine.   Patient may need note to return to work on part time schedule initially. She is in agreement with plan above.   LIVESAY,LENNIS P, MD   01/11/2013, 1:09 PM

## 2013-01-11 NOTE — Telephone Encounter (Signed)
Per staff phone call and POF I have schedueld appts.  JMW  

## 2013-01-11 NOTE — Patient Instructions (Signed)
Compazine sometimes makes people feel agitated or jittery. If you notice this, try phenergan (promethazine) or ativan instead. Ativan (lorazepam) gives you some amnesia around each dose.  We will send prescriptions for Diflucan (fluconazole) for thrush and phenergan to your pharmacy  Decadron (dexamethasone)  Five of the 4 mg tablets (=20 mg) 12 hours before chemo, so ~ 2:30 AM on 01-12-13.    We will give benadryl orally before chemo instead of IV, to see if that is easier with the restlessness  Vitamin E ~ 400 mg twice daily or 800 mg at bedtime sometimes helps hot flashes.

## 2013-01-11 NOTE — Progress Notes (Unsigned)
Medical Oncology  Requested and received by fax CA 125 drawn 11-13-2012 by Dr Lucilla Lame, 226.4. Lab report to be scanned into this EMR.  Ila Mcgill, MD

## 2013-01-11 NOTE — Telephone Encounter (Signed)
gv and printed appt sched and avs for pt....MW added tx   °

## 2013-01-12 ENCOUNTER — Other Ambulatory Visit: Payer: 59 | Admitting: Lab

## 2013-01-12 ENCOUNTER — Ambulatory Visit (HOSPITAL_BASED_OUTPATIENT_CLINIC_OR_DEPARTMENT_OTHER): Payer: 59

## 2013-01-12 VITALS — BP 106/52 | HR 97 | Temp 98.3°F | Resp 18

## 2013-01-12 DIAGNOSIS — C561 Malignant neoplasm of right ovary: Secondary | ICD-10-CM

## 2013-01-12 DIAGNOSIS — Z5111 Encounter for antineoplastic chemotherapy: Secondary | ICD-10-CM

## 2013-01-12 DIAGNOSIS — C569 Malignant neoplasm of unspecified ovary: Secondary | ICD-10-CM

## 2013-01-12 MED ORDER — SODIUM CHLORIDE 0.9 % IJ SOLN
10.0000 mL | INTRAMUSCULAR | Status: DC | PRN
  Administered 2013-01-12: 10 mL
  Filled 2013-01-12: qty 10

## 2013-01-12 MED ORDER — PROMETHAZINE HCL 25 MG/ML IJ SOLN
25.0000 mg | Freq: Once | INTRAMUSCULAR | Status: AC
  Administered 2013-01-12: 25 mg via INTRAVENOUS
  Filled 2013-01-12: qty 1

## 2013-01-12 MED ORDER — DIPHENHYDRAMINE HCL 25 MG PO TABS
25.0000 mg | ORAL_TABLET | Freq: Once | ORAL | Status: AC
  Administered 2013-01-12: 25 mg via ORAL
  Filled 2013-01-12: qty 1

## 2013-01-12 MED ORDER — SODIUM CHLORIDE 0.9 % IV SOLN
80.0000 mg/m2 | Freq: Once | INTRAVENOUS | Status: AC
  Administered 2013-01-12: 138 mg via INTRAVENOUS
  Filled 2013-01-12: qty 23

## 2013-01-12 MED ORDER — HEPARIN SOD (PORK) LOCK FLUSH 100 UNIT/ML IV SOLN
500.0000 [IU] | Freq: Once | INTRAVENOUS | Status: AC | PRN
  Administered 2013-01-12: 500 [IU]
  Filled 2013-01-12: qty 5

## 2013-01-12 MED ORDER — DEXAMETHASONE SODIUM PHOSPHATE 20 MG/5ML IJ SOLN
20.0000 mg | Freq: Once | INTRAMUSCULAR | Status: AC
  Administered 2013-01-12: 20 mg via INTRAVENOUS

## 2013-01-12 MED ORDER — SODIUM CHLORIDE 0.9 % IV SOLN
Freq: Once | INTRAVENOUS | Status: AC
  Administered 2013-01-12: 15:00:00 via INTRAVENOUS

## 2013-01-12 MED ORDER — FAMOTIDINE IN NACL 20-0.9 MG/50ML-% IV SOLN
20.0000 mg | Freq: Once | INTRAVENOUS | Status: AC
  Administered 2013-01-12: 20 mg via INTRAVENOUS

## 2013-01-12 NOTE — Patient Instructions (Addendum)
[facility] Discharge Instructions for Patients Receiving Chemotherapy  Today you received the following chemotherapy agents Taxol.  To help prevent nausea and vomiting after your treatment, we encourage you to take your nausea medication as directed.    If you develop nausea and vomiting that is not controlled by your nausea medication, call the clinic.   BELOW ARE SYMPTOMS THAT SHOULD BE REPORTED IMMEDIATELY:  *FEVER GREATER THAN 100.5 F  *CHILLS WITH OR WITHOUT FEVER  NAUSEA AND VOMITING THAT IS NOT CONTROLLED WITH YOUR NAUSEA MEDICATION  *UNUSUAL SHORTNESS OF BREATH  *UNUSUAL BRUISING OR BLEEDING  TENDERNESS IN MOUTH AND THROAT WITH OR WITHOUT PRESENCE OF ULCERS  *URINARY PROBLEMS  *BOWEL PROBLEMS  UNUSUAL RASH Items with * indicate a potential emergency and should be followed up as soon as possible.  Feel free to call the clinic you have any questions or concerns. The clinic phone number is [phone].    

## 2013-01-13 ENCOUNTER — Ambulatory Visit (HOSPITAL_BASED_OUTPATIENT_CLINIC_OR_DEPARTMENT_OTHER): Payer: 59

## 2013-01-13 ENCOUNTER — Other Ambulatory Visit: Payer: Self-pay | Admitting: Oncology

## 2013-01-13 ENCOUNTER — Telehealth: Payer: Self-pay | Admitting: Oncology

## 2013-01-13 ENCOUNTER — Encounter: Payer: Self-pay | Admitting: Oncology

## 2013-01-13 VITALS — BP 116/67 | HR 103 | Temp 98.1°F

## 2013-01-13 DIAGNOSIS — C561 Malignant neoplasm of right ovary: Secondary | ICD-10-CM

## 2013-01-13 DIAGNOSIS — Z5189 Encounter for other specified aftercare: Secondary | ICD-10-CM

## 2013-01-13 DIAGNOSIS — C569 Malignant neoplasm of unspecified ovary: Secondary | ICD-10-CM

## 2013-01-13 MED ORDER — FILGRASTIM 300 MCG/0.5ML IJ SOLN
300.0000 ug | Freq: Once | INTRAMUSCULAR | Status: AC
  Administered 2013-01-13: 300 ug via SUBCUTANEOUS
  Filled 2013-01-13: qty 0.5

## 2013-01-13 NOTE — Progress Notes (Signed)
Medical Oncology  Patient here for injection, reported severe muscle spasms LLE last pm lasting 2 hours, worst episode so far. She is requesting consultation for this as had been discussed previously. Referral in process now.  Candice Mcgill, MD

## 2013-01-13 NOTE — Telephone Encounter (Signed)
Medical Oncology  Spoke with patient by phone to let her know referral in process. Pain began yesterday when she walked across parking lot after chemo, then very severe x 2 hours. She has had none today and otherwise ok after chemo, with less jitteriness using lower dose of benadryl po as premed and no compazine. She will call if needed prior to hearing back about consultation appointment.  Candice Mcgill, MD

## 2013-01-14 ENCOUNTER — Telehealth: Payer: Self-pay | Admitting: Oncology

## 2013-01-14 ENCOUNTER — Other Ambulatory Visit: Payer: Self-pay

## 2013-01-14 DIAGNOSIS — M792 Neuralgia and neuritis, unspecified: Secondary | ICD-10-CM

## 2013-01-14 MED ORDER — PREGABALIN 75 MG PO CAPS: 75.0000 mg | ORAL_CAPSULE | Freq: Two times a day (BID) | ORAL | Status: DC

## 2013-01-14 NOTE — Telephone Encounter (Signed)
Medical Oncology   Called back to [facility] in follow up of new patient appointment scheduling.  They did receive all information from our office yesterday. First appointment at that office with any MD is with Dr Rodolph Bong on Mon July 14 arrive 1:45 for 2:15 appointment.  In meantime we will also begin Lyrica at 75 mg bid.  RN to speak with patient.  Ila Mcgill, MD

## 2013-01-14 NOTE — Progress Notes (Signed)
Spoke with Ms. Blanke and told her that lyrica was callled into her pharmacy.  She heard from Dr. Markus Jarvis office regarding appointment there 01-25-13 at 1345.

## 2013-01-19 ENCOUNTER — Ambulatory Visit (HOSPITAL_BASED_OUTPATIENT_CLINIC_OR_DEPARTMENT_OTHER): Payer: 59

## 2013-01-19 ENCOUNTER — Other Ambulatory Visit (HOSPITAL_BASED_OUTPATIENT_CLINIC_OR_DEPARTMENT_OTHER): Payer: 59 | Admitting: Lab

## 2013-01-19 ENCOUNTER — Other Ambulatory Visit: Payer: 59 | Admitting: Lab

## 2013-01-19 VITALS — BP 105/63 | HR 99 | Temp 98.0°F

## 2013-01-19 DIAGNOSIS — Z5111 Encounter for antineoplastic chemotherapy: Secondary | ICD-10-CM

## 2013-01-19 DIAGNOSIS — C561 Malignant neoplasm of right ovary: Secondary | ICD-10-CM

## 2013-01-19 DIAGNOSIS — C569 Malignant neoplasm of unspecified ovary: Secondary | ICD-10-CM

## 2013-01-19 DIAGNOSIS — R1900 Intra-abdominal and pelvic swelling, mass and lump, unspecified site: Secondary | ICD-10-CM

## 2013-01-19 LAB — CBC WITH DIFFERENTIAL/PLATELET
Eosinophils Absolute: 0 10*3/uL (ref 0.0–0.5)
HCT: 32.9 % — ABNORMAL LOW (ref 34.8–46.6)
LYMPH%: 16.6 % (ref 14.0–49.7)
MONO#: 0 10*3/uL — ABNORMAL LOW (ref 0.1–0.9)
NEUT#: 2 10*3/uL (ref 1.5–6.5)
Platelets: 175 10*3/uL (ref 145–400)
RBC: 3.6 10*6/uL — ABNORMAL LOW (ref 3.70–5.45)
WBC: 2.5 10*3/uL — ABNORMAL LOW (ref 3.9–10.3)

## 2013-01-19 LAB — COMPREHENSIVE METABOLIC PANEL (CC13)
ALT: 17 U/L (ref 0–55)
Albumin: 3.7 g/dL (ref 3.5–5.0)
CO2: 26 mEq/L (ref 22–29)
Calcium: 9.8 mg/dL (ref 8.4–10.4)
Chloride: 106 mEq/L (ref 98–109)
Creatinine: 0.9 mg/dL (ref 0.6–1.1)
Total Protein: 7.2 g/dL (ref 6.4–8.3)

## 2013-01-19 MED ORDER — PROMETHAZINE HCL 25 MG/ML IJ SOLN
25.0000 mg | Freq: Once | INTRAMUSCULAR | Status: AC
  Administered 2013-01-19: 25 mg via INTRAVENOUS
  Filled 2013-01-19: qty 1

## 2013-01-19 MED ORDER — HEPARIN SOD (PORK) LOCK FLUSH 100 UNIT/ML IV SOLN
500.0000 [IU] | Freq: Once | INTRAVENOUS | Status: AC | PRN
  Administered 2013-01-19: 500 [IU]
  Filled 2013-01-19: qty 5

## 2013-01-19 MED ORDER — SODIUM CHLORIDE 0.9 % IV SOLN
80.0000 mg/m2 | Freq: Once | INTRAVENOUS | Status: AC
  Administered 2013-01-19: 138 mg via INTRAVENOUS
  Filled 2013-01-19: qty 23

## 2013-01-19 MED ORDER — DIPHENHYDRAMINE HCL 25 MG PO TABS
25.0000 mg | ORAL_TABLET | Freq: Once | ORAL | Status: AC
  Administered 2013-01-19: 25 mg via ORAL
  Filled 2013-01-19: qty 1

## 2013-01-19 MED ORDER — SODIUM CHLORIDE 0.9 % IV SOLN
Freq: Once | INTRAVENOUS | Status: AC
  Administered 2013-01-19: 14:00:00 via INTRAVENOUS

## 2013-01-19 MED ORDER — DEXAMETHASONE 4 MG PO TABS: ORAL_TABLET | ORAL | Status: DC

## 2013-01-19 MED ORDER — DIPHENHYDRAMINE HCL 25 MG PO TABS
25.0000 mg | ORAL_TABLET | Freq: Once | ORAL | Status: DC
  Filled 2013-01-19: qty 1

## 2013-01-19 MED ORDER — FAMOTIDINE IN NACL 20-0.9 MG/50ML-% IV SOLN
20.0000 mg | Freq: Once | INTRAVENOUS | Status: AC
  Administered 2013-01-19: 20 mg via INTRAVENOUS

## 2013-01-19 MED ORDER — SODIUM CHLORIDE 0.9 % IJ SOLN
10.0000 mL | INTRAMUSCULAR | Status: DC | PRN
  Administered 2013-01-19: 10 mL
  Filled 2013-01-19: qty 10

## 2013-01-19 MED ORDER — DEXAMETHASONE SODIUM PHOSPHATE 20 MG/5ML IJ SOLN
20.0000 mg | Freq: Once | INTRAMUSCULAR | Status: AC
  Administered 2013-01-19: 20 mg via INTRAVENOUS

## 2013-01-19 NOTE — Progress Notes (Signed)
Per Dr Karel Jarvis, okay to treat with current CBC results 01/19/13.  SLJ

## 2013-01-19 NOTE — Patient Instructions (Addendum)
[facility] Discharge Instructions for Patients Receiving Chemotherapy  Today you received the following chemotherapy agents taxol  To help prevent nausea and vomiting after your treatment, we encourage you to take your nausea medication as needed.   If you develop nausea and vomiting that is not controlled by your nausea medication, call the clinic.   BELOW ARE SYMPTOMS THAT SHOULD BE REPORTED IMMEDIATELY:  *FEVER GREATER THAN 100.5 F  *CHILLS WITH OR WITHOUT FEVER  NAUSEA AND VOMITING THAT IS NOT CONTROLLED WITH YOUR NAUSEA MEDICATION  *UNUSUAL SHORTNESS OF BREATH  *UNUSUAL BRUISING OR BLEEDING  TENDERNESS IN MOUTH AND THROAT WITH OR WITHOUT PRESENCE OF ULCERS  *URINARY PROBLEMS  *BOWEL PROBLEMS  UNUSUAL RASH Items with * indicate a potential emergency and should be followed up as soon as possible.  Feel free to call the clinic you have any questions or concerns. The clinic phone number is [phone].    

## 2013-01-20 ENCOUNTER — Telehealth: Payer: Self-pay | Admitting: Oncology

## 2013-01-20 ENCOUNTER — Ambulatory Visit (HOSPITAL_BASED_OUTPATIENT_CLINIC_OR_DEPARTMENT_OTHER): Payer: 59

## 2013-01-20 ENCOUNTER — Other Ambulatory Visit: Payer: Self-pay | Admitting: Hematology and Oncology

## 2013-01-20 ENCOUNTER — Telehealth: Payer: Self-pay

## 2013-01-20 ENCOUNTER — Telehealth: Payer: Self-pay | Admitting: *Deleted

## 2013-01-20 ENCOUNTER — Ambulatory Visit: Payer: 59

## 2013-01-20 DIAGNOSIS — D709 Neutropenia, unspecified: Secondary | ICD-10-CM

## 2013-01-20 DIAGNOSIS — Z5189 Encounter for other specified aftercare: Secondary | ICD-10-CM

## 2013-01-20 DIAGNOSIS — C569 Malignant neoplasm of unspecified ovary: Secondary | ICD-10-CM

## 2013-01-20 LAB — CA 125: CA 125: 29.9 U/mL (ref 0.0–30.2)

## 2013-01-20 MED ORDER — FILGRASTIM 300 MCG/0.5ML IJ SOLN
300.0000 ug | Freq: Once | INTRAMUSCULAR | Status: AC
  Administered 2013-01-20: 300 ug via SUBCUTANEOUS
  Filled 2013-01-20: qty 0.5

## 2013-01-20 NOTE — Telephone Encounter (Signed)
Called patient of cell phone about missed injection appointment.  Left message to call back.

## 2013-01-20 NOTE — Telephone Encounter (Signed)
Candice Zamora called stating that the lyrica 75 mg is lasting 8 hours then she is getting some leg cramps. She would like to have the lyrica increased if possible. Told her that Dr. Truett Perna said that she could increase the lyrica to 150 mg in the am and continue to take the 75 mg in the evening.  Candice Zamora verbalized understanding.  She has enough lyrica to last her until the appointment-01-25-13 with Dr. Rodolph Bong. Pt. Rescheduled injection appointment to 1430 today.

## 2013-01-20 NOTE — Telephone Encounter (Signed)
Per staff message and POF I have scheduled appts.  JMW  

## 2013-01-20 NOTE — Telephone Encounter (Signed)
lvm for pt advising that all appt on 7.14 have been moved to 7.15.14 with tx. due to new provider availability.Marland KitchenMarland Kitchen

## 2013-01-26 ENCOUNTER — Other Ambulatory Visit: Payer: 59 | Admitting: Lab

## 2013-01-26 ENCOUNTER — Ambulatory Visit (HOSPITAL_BASED_OUTPATIENT_CLINIC_OR_DEPARTMENT_OTHER): Payer: 59 | Admitting: Hematology and Oncology

## 2013-01-26 ENCOUNTER — Ambulatory Visit (HOSPITAL_BASED_OUTPATIENT_CLINIC_OR_DEPARTMENT_OTHER): Payer: 59 | Admitting: Lab

## 2013-01-26 ENCOUNTER — Ambulatory Visit (HOSPITAL_BASED_OUTPATIENT_CLINIC_OR_DEPARTMENT_OTHER): Payer: 59

## 2013-01-26 VITALS — BP 108/71 | HR 109 | Temp 97.0°F | Resp 17 | Ht 66.0 in | Wt 150.0 lb

## 2013-01-26 DIAGNOSIS — M25559 Pain in unspecified hip: Secondary | ICD-10-CM

## 2013-01-26 DIAGNOSIS — Z5111 Encounter for antineoplastic chemotherapy: Secondary | ICD-10-CM

## 2013-01-26 DIAGNOSIS — C569 Malignant neoplasm of unspecified ovary: Secondary | ICD-10-CM

## 2013-01-26 DIAGNOSIS — C561 Malignant neoplasm of right ovary: Secondary | ICD-10-CM

## 2013-01-26 DIAGNOSIS — R112 Nausea with vomiting, unspecified: Secondary | ICD-10-CM

## 2013-01-26 DIAGNOSIS — R971 Elevated cancer antigen 125 [CA 125]: Secondary | ICD-10-CM

## 2013-01-26 DIAGNOSIS — B029 Zoster without complications: Secondary | ICD-10-CM

## 2013-01-26 LAB — CBC WITH DIFFERENTIAL/PLATELET
BASO%: 0 % (ref 0.0–2.0)
Eosinophils Absolute: 0 10*3/uL (ref 0.0–0.5)
MONO#: 0 10*3/uL — ABNORMAL LOW (ref 0.1–0.9)
NEUT#: 1.5 10*3/uL (ref 1.5–6.5)
RBC: 3.51 10*6/uL — ABNORMAL LOW (ref 3.70–5.45)
RDW: 14.1 % (ref 11.2–14.5)
WBC: 1.8 10*3/uL — ABNORMAL LOW (ref 3.9–10.3)
lymph#: 0.3 10*3/uL — ABNORMAL LOW (ref 0.9–3.3)

## 2013-01-26 MED ORDER — PROMETHAZINE HCL 25 MG/ML IJ SOLN
25.0000 mg | Freq: Once | INTRAMUSCULAR | Status: AC
  Administered 2013-01-26: 25 mg via INTRAVENOUS
  Filled 2013-01-26: qty 1

## 2013-01-26 MED ORDER — DEXAMETHASONE SODIUM PHOSPHATE 20 MG/5ML IJ SOLN
20.0000 mg | Freq: Once | INTRAMUSCULAR | Status: AC
  Administered 2013-01-26: 20 mg via INTRAVENOUS

## 2013-01-26 MED ORDER — SODIUM CHLORIDE 0.9 % IJ SOLN
10.0000 mL | INTRAMUSCULAR | Status: DC | PRN
  Administered 2013-01-26: 10 mL
  Filled 2013-01-26: qty 10

## 2013-01-26 MED ORDER — CARBOPLATIN CHEMO INJECTION 600 MG/60ML
540.0000 mg | Freq: Once | INTRAVENOUS | Status: AC
  Administered 2013-01-26: 540 mg via INTRAVENOUS
  Filled 2013-01-26: qty 54

## 2013-01-26 MED ORDER — SODIUM CHLORIDE 0.9 % IV SOLN
Freq: Once | INTRAVENOUS | Status: AC
  Administered 2013-01-26: 14:00:00 via INTRAVENOUS

## 2013-01-26 MED ORDER — DIPHENHYDRAMINE HCL 25 MG PO TABS
25.0000 mg | ORAL_TABLET | Freq: Once | ORAL | Status: AC
  Administered 2013-01-26: 25 mg via ORAL
  Filled 2013-01-26: qty 1

## 2013-01-26 MED ORDER — LORAZEPAM 1 MG PO TABS: 1.0000 mg | ORAL_TABLET | Freq: Once | ORAL | Status: DC | PRN

## 2013-01-26 MED ORDER — HEPARIN SOD (PORK) LOCK FLUSH 100 UNIT/ML IV SOLN
500.0000 [IU] | Freq: Once | INTRAVENOUS | Status: AC | PRN
  Administered 2013-01-26: 500 [IU]
  Filled 2013-01-26: qty 5

## 2013-01-26 MED ORDER — PREGABALIN 75 MG PO CAPS: ORAL_CAPSULE | ORAL | Status: DC

## 2013-01-26 MED ORDER — DEXTROSE 5 % IV SOLN
50.0000 mg | Freq: Once | INTRAVENOUS | Status: AC
  Administered 2013-01-26: 50 mg via INTRAVENOUS
  Filled 2013-01-26: qty 2

## 2013-01-26 MED ORDER — SODIUM CHLORIDE 0.9 % IV SOLN
80.0000 mg/m2 | Freq: Once | INTRAVENOUS | Status: AC
  Administered 2013-01-26: 138 mg via INTRAVENOUS
  Filled 2013-01-26: qty 23

## 2013-01-26 MED ORDER — FAMOTIDINE IN NACL 20-0.9 MG/50ML-% IV SOLN
20.0000 mg | Freq: Once | INTRAVENOUS | Status: DC
Start: 2013-01-26 — End: 2013-01-26

## 2013-01-26 MED ORDER — PROMETHAZINE HCL 25 MG/ML IJ SOLN
25.0000 mg | Freq: Once | INTRAMUSCULAR | Status: DC
  Filled 2013-01-26: qty 1

## 2013-01-26 NOTE — Progress Notes (Signed)
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 She was extremely restless even with decrease in IV benadryl premed to 25 mg, but otherwise has done fairly well with the first treatment. LLE pain thought related to zoster has improved over past 2-3 days. She has PAC.  Oncologic History  Patient presented with RUQ pain in March 2014, with ED evaluation then including abdominal US not remarkable. She was then found to have pelvic mass, with MRI pelvis in [facility] system 11-11-12 showing centrally necrotic pelvic mass 11.4 x 9.1 x 7.8 cm. CA 125 by Dr Seymour Bars 11-13-2012 was 226.4. She saw Dr Yolande Jolly in [place] on 11-17-12 and went to surgery by him at [facility] on 11-24-12. Surgery was complete debulking including TAH, BSO, omentectomy, right ureterolysis, bilateral pelvic lymphadenectomy and periaortic lymphadenectomy. There is no mention in the operative note of rupture of the ovarian capsule. 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 Pelvic washings ([id number]) negative. Assuming the ovarian capsule was disrupted after surgery, this would be pT1a pN0 for IA staging. Patient was hospitalized at [facility] x 5 days; per patient and husband, she was transfused 2 units PRBCs at [facility]. Post operative course was unremarkable until she developed acute herpes zoster left ~ T 11-12 dermatome on 12-12-12, then severe pain left inguinal region and upper lateral left thigh such that start of dose dense adjuvant taxol carboplatin was delayed x2 prior to beginning 01-05-13. As WBC and  ANC have been low normal, will give neupogen days 2,9,16 (tho need to follow counts with this as the lower range may have been related to acute zoster).   Some queasiness without frank nausea, still with left lower limb spasm, no fever.  Is drinking water. Bowels ok. No abdominal or pelvic pain, no bleeding. Hot flashes waking her at least every hour at night, very tired with this. No bladder symptoms. Had 2 episodes of LLE pain   Objective:  Vital signs in last 24 hours:  BP 108/71  Pulse 109  Temp(Src) 97 F (36.1 C) (Oral)  Resp 17  Ht 5\' 6"  (1.676 m)  Wt 150 lb (68.04 kg)  BMI 24.22 kg/m2  LMP 10/25/2012  Weight stable. Alert, looks tired but NAD. Drinking water. No alopecia  HEENT:PERRLA, sclera clear, anicteric and oropharynx clear, no lesions. Tongue coated consistent with candida, none on buccal mucosa or posterior pharynx LymphaticsCervical, supraclavicular, and axillary nodes normal. Resp: clear to auscultation bilaterally and normal percussion bilaterally Cardio: regular rate and rhythm GI: soft, non-tender; bowel sounds normal; no masses,  no organomegaly Extremities: extremities normal, atraumatic, no cyanosis or edema Neuro:no sensory deficits noted Skin: zoster rash left flank gradually improving erythema Portacath-without erythema or tenderness  Lab Results:  Results for orders placed in visit on 01/26/13  CBC WITH DIFFERENTIAL      Result Value Range   WBC 1.8 (*) 3.9 - 10.3 10e3/uL   NEUT# 1.5  1.5 - 6.5 10e3/uL   HGB 10.6 (*) 11.6 - 15.9 g/dL   HCT 14.7 (*) 82.9 - 56.2 %   Platelets 140 (*) 145 - 400 10e3/uL  MCV 91.5  79.5 - 101.0 fL   MCH 30.2  25.1 - 34.0 pg   MCHC 33.0  31.5 - 36.0 g/dL   RBC 1.61 (*) 0.96 - 0.45 10e6/uL   RDW 14.1  11.2 - 14.5 %   lymph# 0.3 (*) 0.9 - 3.3 10e3/uL   MONO# 0.0 (*) 0.1 - 0.9 10e3/uL   Eosinophils Absolute 0.0  0.0 - 0.5 10e3/uL   Basophils Absolute 0.0  0.0 - 0.1 10e3/uL   NEUT% 81.0 (*) 38.4 - 76.8 %   LYMPH%  18.5  14.0 - 49.7 %   MONO% 0.5  0.0 - 14.0 %   EOS% 0.0  0.0 - 7.0 %   BASO% 0.0  0.0 - 2.0 %     Studies/Results:  No results found.  Medications: I have reviewed the patient's current medications. Will have phenergan available in case she can tell that she is not tolerating compazine; note HA with zofran. Diflucan 100 mg daily x 7 for oral thrush. Will decrease premed decadron to 20 mg 12 hrs prior to taxol + the IV decadron. Will change premed benadryl to 25 mg po in hopes that this will lessen the restlessness; prn ativan added to premeds for restlessness. She will try Vitamin E 400 mg bid or 800 mg at hs for hot flashes; she may need to discuss short term estrogen with Dr Yolande Jolly Note she continues q 8 hr flexeril for the LLE pain. We have discussed gradually tapering this when the leg pain improves.  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 She will have day 8 cycle 1 taxol on 01-12-13 with neupogen on 7-2, then day 15 cycle 1 taxol on 7-8 as long as ANC >=1.5 and plt .=100k, neupogen on 01-20-13. She will have CMET and CA 125 drawn from [facility] in infusion on 01-19-13. She will receive her day 1 cycle 2 carbo + taxol today on 7-15 and neupogen on 01-27-13. 2.acute herpes zoster: improved. Will use prophylactic acyclovir for duration of chemo  3. Left groin and left lateral thigh pain: likely also related to the zoster, tho some mild degenerative changes on imaging; nothing obvious on CT pelvis to cause this. Better with prn flexeril for now.  6.hypothyroid on replacement  3.lactose and gluten intolerance  4.PAC in, placed at [facility].  5.oral candida: diflucan as above 6.intolerance to zofran with HA, severe restless legs with benadryl, possible intolerance to  compazine.   Patient may need note to return to work on part time schedule initially. She is in agreement with plan above.   Zachery Dakins, MD   01/26/2013, 12:27 PM

## 2013-01-26 NOTE — Patient Instructions (Addendum)
[facility] Discharge Instructions for Patients Receiving Chemotherapy  Today you received the following chemotherapy agents :  Taxol, Carboplatin.  To help prevent nausea and vomiting after your treatment, we encourage you to take your nausea medication as instructed by your physician.  DO NOT Eat spicy nor greasy foods.  DO Drink lots of fluids.    If you develop nausea and vomiting that is not controlled by your nausea medication, call the clinic.   BELOW ARE SYMPTOMS THAT SHOULD BE REPORTED IMMEDIATELY:  *FEVER GREATER THAN 100.5 F  *CHILLS WITH OR WITHOUT FEVER  NAUSEA AND VOMITING THAT IS NOT CONTROLLED WITH YOUR NAUSEA MEDICATION  *UNUSUAL SHORTNESS OF BREATH  *UNUSUAL BRUISING OR BLEEDING  TENDERNESS IN MOUTH AND THROAT WITH OR WITHOUT PRESENCE OF ULCERS  *URINARY PROBLEMS  *BOWEL PROBLEMS  UNUSUAL RASH Items with * indicate a potential emergency and should be followed up as soon as possible.  Feel free to call the clinic you have any questions or concerns. The clinic phone number is [phone].

## 2013-01-27 ENCOUNTER — Telehealth: Payer: Self-pay | Admitting: Hematology and Oncology

## 2013-01-27 ENCOUNTER — Ambulatory Visit (HOSPITAL_BASED_OUTPATIENT_CLINIC_OR_DEPARTMENT_OTHER): Payer: 59

## 2013-01-27 VITALS — BP 107/58 | HR 89 | Temp 97.6°F

## 2013-01-27 DIAGNOSIS — Z5189 Encounter for other specified aftercare: Secondary | ICD-10-CM

## 2013-01-27 DIAGNOSIS — C569 Malignant neoplasm of unspecified ovary: Secondary | ICD-10-CM

## 2013-01-27 MED ORDER — FILGRASTIM 300 MCG/0.5ML IJ SOLN
300.0000 ug | Freq: Once | INTRAMUSCULAR | Status: AC
  Administered 2013-01-27: 300 ug via SUBCUTANEOUS
  Filled 2013-01-27: qty 0.5

## 2013-01-27 NOTE — Telephone Encounter (Signed)
Pt stopped by for schedule. gv pt appt schedule for July and August. Added inj's day after each tx per [facility] (day 1, 8, 15). Also due to lb meeting and provider availability for 8/5 both lb/CP1 scheduled for 8:30 am. Added comments to appts that pt to be arrived to lb 1st and then CP1. Pt aware on 8/5 she will do lb/CP1/gynonc then tx. Also per pt she was to see CP1 again 7/22. Checked w/Sr. [place] and per Dr. Karel Jarvis due to call just schedule lb/tx if pt is ok she can have lb and proceed to tx if pt is having any problem he will see her. Pt informed.

## 2013-01-28 NOTE — Telephone Encounter (Signed)
Opened in error

## 2013-01-29 ENCOUNTER — Telehealth: Payer: Self-pay

## 2013-01-29 NOTE — Telephone Encounter (Signed)
Ms. Candice Zamora called to let Dr. Karel Jarvis know that she has not had any muscle spasms in left leg for three days. YEA!

## 2013-02-02 ENCOUNTER — Ambulatory Visit: Payer: 59

## 2013-02-02 ENCOUNTER — Other Ambulatory Visit (HOSPITAL_BASED_OUTPATIENT_CLINIC_OR_DEPARTMENT_OTHER): Payer: 59 | Admitting: Lab

## 2013-02-02 DIAGNOSIS — C569 Malignant neoplasm of unspecified ovary: Secondary | ICD-10-CM

## 2013-02-02 DIAGNOSIS — C561 Malignant neoplasm of right ovary: Secondary | ICD-10-CM

## 2013-02-02 LAB — CBC WITH DIFFERENTIAL/PLATELET
BASO%: 0 % (ref 0.0–2.0)
EOS%: 0 % (ref 0.0–7.0)
HCT: 33.8 % — ABNORMAL LOW (ref 34.8–46.6)
LYMPH%: 29.8 % (ref 14.0–49.7)
MCH: 30 pg (ref 25.1–34.0)
MCHC: 33.1 g/dL (ref 31.5–36.0)
MCV: 90.6 fL (ref 79.5–101.0)
MONO%: 1.9 % (ref 0.0–14.0)
NEUT%: 68.3 % (ref 38.4–76.8)
Platelets: 196 10*3/uL (ref 145–400)
RBC: 3.73 10*6/uL (ref 3.70–5.45)
WBC: 1 10*3/uL — ABNORMAL LOW (ref 3.9–10.3)
nRBC: 0 % (ref 0–0)

## 2013-02-03 ENCOUNTER — Ambulatory Visit (HOSPITAL_BASED_OUTPATIENT_CLINIC_OR_DEPARTMENT_OTHER): Payer: 59

## 2013-02-03 VITALS — BP 102/59 | HR 98 | Temp 98.2°F

## 2013-02-03 DIAGNOSIS — Z5189 Encounter for other specified aftercare: Secondary | ICD-10-CM

## 2013-02-03 DIAGNOSIS — C569 Malignant neoplasm of unspecified ovary: Secondary | ICD-10-CM

## 2013-02-03 MED ORDER — FILGRASTIM 300 MCG/0.5ML IJ SOLN
300.0000 ug | Freq: Once | INTRAMUSCULAR | Status: AC
  Administered 2013-02-03: 300 ug via SUBCUTANEOUS
  Filled 2013-02-03: qty 0.5

## 2013-02-03 NOTE — Progress Notes (Signed)
Patient with low counts 02/02/2013  ANC of 0.7 and WBC 1.0.  She will receive Neupogen on 7/23 and will return to clinic Next Tuesday for labs and possible treatment per Dr Karel Jarvis.

## 2013-02-08 ENCOUNTER — Encounter: Payer: Self-pay | Admitting: Pharmacist

## 2013-02-09 ENCOUNTER — Other Ambulatory Visit (HOSPITAL_BASED_OUTPATIENT_CLINIC_OR_DEPARTMENT_OTHER): Payer: 59 | Admitting: Lab

## 2013-02-09 ENCOUNTER — Ambulatory Visit (HOSPITAL_BASED_OUTPATIENT_CLINIC_OR_DEPARTMENT_OTHER): Payer: 59

## 2013-02-09 VITALS — BP 105/60 | HR 76 | Temp 98.2°F

## 2013-02-09 DIAGNOSIS — Z5111 Encounter for antineoplastic chemotherapy: Secondary | ICD-10-CM

## 2013-02-09 DIAGNOSIS — C561 Malignant neoplasm of right ovary: Secondary | ICD-10-CM

## 2013-02-09 DIAGNOSIS — C569 Malignant neoplasm of unspecified ovary: Secondary | ICD-10-CM

## 2013-02-09 LAB — CBC WITH DIFFERENTIAL/PLATELET
BASO%: 0.4 % (ref 0.0–2.0)
Basophils Absolute: 0 10*3/uL (ref 0.0–0.1)
EOS%: 1.2 % (ref 0.0–7.0)
Eosinophils Absolute: 0 10*3/uL (ref 0.0–0.5)
HCT: 33.4 % — ABNORMAL LOW (ref 34.8–46.6)
HGB: 11.4 g/dL — ABNORMAL LOW (ref 11.6–15.9)
LYMPH%: 29.7 % (ref 14.0–49.7)
MCH: 31.8 pg (ref 25.1–34.0)
MCHC: 34.2 g/dL (ref 31.5–36.0)
MCV: 93.1 fL (ref 79.5–101.0)
MONO#: 0.5 10*3/uL (ref 0.1–0.9)
MONO%: 14.5 % — ABNORMAL HIGH (ref 0.0–14.0)
NEUT#: 1.7 10*3/uL (ref 1.5–6.5)
NEUT%: 54.2 % (ref 38.4–76.8)
Platelets: 220 10*3/uL (ref 145–400)
RBC: 3.59 10*6/uL — ABNORMAL LOW (ref 3.70–5.45)
RDW: 17 % — ABNORMAL HIGH (ref 11.2–14.5)
WBC: 3.2 10*3/uL — ABNORMAL LOW (ref 3.9–10.3)
lymph#: 0.9 10*3/uL (ref 0.9–3.3)

## 2013-02-09 LAB — COMPREHENSIVE METABOLIC PANEL (CC13)
ALT: 12 U/L (ref 0–55)
AST: 16 U/L (ref 5–34)
Albumin: 3.8 g/dL (ref 3.5–5.0)
Alkaline Phosphatase: 60 U/L (ref 40–150)
BUN: 13.7 mg/dL (ref 7.0–26.0)
Chloride: 107 mEq/L (ref 98–109)
Creatinine: 0.9 mg/dL (ref 0.6–1.1)
Potassium: 4 mEq/L (ref 3.5–5.1)

## 2013-02-09 MED ORDER — HEPARIN SOD (PORK) LOCK FLUSH 100 UNIT/ML IV SOLN
500.0000 [IU] | Freq: Once | INTRAVENOUS | Status: AC | PRN
  Administered 2013-02-09: 500 [IU]
  Filled 2013-02-09: qty 5

## 2013-02-09 MED ORDER — SODIUM CHLORIDE 0.9 % IV SOLN
Freq: Once | INTRAVENOUS | Status: AC
  Administered 2013-02-09: 13:00:00 via INTRAVENOUS

## 2013-02-09 MED ORDER — PROMETHAZINE HCL 25 MG/ML IJ SOLN
25.0000 mg | Freq: Once | INTRAMUSCULAR | Status: AC
  Administered 2013-02-09: 25 mg via INTRAVENOUS
  Filled 2013-02-09: qty 1

## 2013-02-09 MED ORDER — SODIUM CHLORIDE 0.9 % IJ SOLN
10.0000 mL | INTRAMUSCULAR | Status: DC | PRN
  Administered 2013-02-09: 10 mL
  Filled 2013-02-09: qty 10

## 2013-02-09 MED ORDER — DEXAMETHASONE SODIUM PHOSPHATE 20 MG/5ML IJ SOLN
20.0000 mg | Freq: Once | INTRAMUSCULAR | Status: AC
  Administered 2013-02-09: 20 mg via INTRAVENOUS

## 2013-02-09 MED ORDER — LORAZEPAM 1 MG PO TABS
1.0000 mg | ORAL_TABLET | Freq: Once | ORAL | Status: AC | PRN
  Administered 2013-02-09: 1 mg via ORAL

## 2013-02-09 MED ORDER — PACLITAXEL CHEMO INJECTION 300 MG/50ML
80.0000 mg/m2 | Freq: Once | INTRAVENOUS | Status: AC
  Administered 2013-02-09: 138 mg via INTRAVENOUS
  Filled 2013-02-09: qty 23

## 2013-02-09 MED ORDER — DIPHENHYDRAMINE HCL 25 MG PO TABS
25.0000 mg | ORAL_TABLET | Freq: Once | ORAL | Status: AC
  Administered 2013-02-09: 25 mg via ORAL
  Filled 2013-02-09: qty 1

## 2013-02-09 MED ORDER — FAMOTIDINE IN NACL 20-0.9 MG/50ML-% IV SOLN
20.0000 mg | Freq: Once | INTRAVENOUS | Status: AC
  Administered 2013-02-09: 20 mg via INTRAVENOUS

## 2013-02-09 NOTE — Patient Instructions (Addendum)
[facility] Discharge Instructions for Patients Receiving Chemotherapy  Today you received the following chemotherapy agents: Taxol.  To help prevent nausea and vomiting after your treatment, we encourage you to take your nausea medication as prescribed.   If you develop nausea and vomiting that is not controlled by your nausea medication, call the clinic.   BELOW ARE SYMPTOMS THAT SHOULD BE REPORTED IMMEDIATELY:  *FEVER GREATER THAN 100.5 F  *CHILLS WITH OR WITHOUT FEVER  NAUSEA AND VOMITING THAT IS NOT CONTROLLED WITH YOUR NAUSEA MEDICATION  *UNUSUAL SHORTNESS OF BREATH  *UNUSUAL BRUISING OR BLEEDING  TENDERNESS IN MOUTH AND THROAT WITH OR WITHOUT PRESENCE OF ULCERS  *URINARY PROBLEMS  *BOWEL PROBLEMS  UNUSUAL RASH Items with * indicate a potential emergency and should be followed up as soon as possible.  Feel free to call the clinic you have any questions or concerns. The clinic phone number is [phone].    

## 2013-02-10 ENCOUNTER — Ambulatory Visit (HOSPITAL_BASED_OUTPATIENT_CLINIC_OR_DEPARTMENT_OTHER): Payer: 59

## 2013-02-10 VITALS — BP 114/59 | HR 85 | Temp 98.4°F

## 2013-02-10 DIAGNOSIS — C569 Malignant neoplasm of unspecified ovary: Secondary | ICD-10-CM

## 2013-02-10 DIAGNOSIS — Z5189 Encounter for other specified aftercare: Secondary | ICD-10-CM

## 2013-02-10 MED ORDER — FILGRASTIM 300 MCG/0.5ML IJ SOLN
300.0000 ug | Freq: Once | INTRAMUSCULAR | Status: AC
  Administered 2013-02-10: 300 ug via SUBCUTANEOUS
  Filled 2013-02-10: qty 0.5

## 2013-02-11 ENCOUNTER — Encounter: Payer: Self-pay | Admitting: *Deleted

## 2013-02-16 ENCOUNTER — Encounter: Payer: Self-pay | Admitting: Gynecology

## 2013-02-16 ENCOUNTER — Ambulatory Visit (HOSPITAL_BASED_OUTPATIENT_CLINIC_OR_DEPARTMENT_OTHER): Payer: 59

## 2013-02-16 ENCOUNTER — Ambulatory Visit (HOSPITAL_BASED_OUTPATIENT_CLINIC_OR_DEPARTMENT_OTHER): Payer: 59 | Admitting: Hematology and Oncology

## 2013-02-16 ENCOUNTER — Other Ambulatory Visit (HOSPITAL_BASED_OUTPATIENT_CLINIC_OR_DEPARTMENT_OTHER): Payer: 59 | Admitting: Lab

## 2013-02-16 ENCOUNTER — Ambulatory Visit: Payer: 59 | Attending: Gynecology | Admitting: Gynecology

## 2013-02-16 VITALS — BP 113/70 | HR 105 | Temp 97.8°F | Resp 20 | Ht 66.0 in | Wt 147.3 lb

## 2013-02-16 VITALS — BP 98/60 | HR 72 | Temp 98.2°F | Resp 16 | Ht 66.0 in | Wt 147.1 lb

## 2013-02-16 DIAGNOSIS — C569 Malignant neoplasm of unspecified ovary: Secondary | ICD-10-CM | POA: Insufficient documentation

## 2013-02-16 DIAGNOSIS — Z9079 Acquired absence of other genital organ(s): Secondary | ICD-10-CM | POA: Insufficient documentation

## 2013-02-16 DIAGNOSIS — Z79899 Other long term (current) drug therapy: Secondary | ICD-10-CM | POA: Insufficient documentation

## 2013-02-16 DIAGNOSIS — C561 Malignant neoplasm of right ovary: Secondary | ICD-10-CM

## 2013-02-16 DIAGNOSIS — Z5111 Encounter for antineoplastic chemotherapy: Secondary | ICD-10-CM

## 2013-02-16 DIAGNOSIS — E039 Hypothyroidism, unspecified: Secondary | ICD-10-CM | POA: Insufficient documentation

## 2013-02-16 DIAGNOSIS — Z90710 Acquired absence of both cervix and uterus: Secondary | ICD-10-CM | POA: Insufficient documentation

## 2013-02-16 DIAGNOSIS — B370 Candidal stomatitis: Secondary | ICD-10-CM

## 2013-02-16 DIAGNOSIS — B0089 Other herpesviral infection: Secondary | ICD-10-CM

## 2013-02-16 LAB — CBC WITH DIFFERENTIAL/PLATELET
Basophils Absolute: 0 10*3/uL (ref 0.0–0.1)
Eosinophils Absolute: 0 10*3/uL (ref 0.0–0.5)
HGB: 11.1 g/dL — ABNORMAL LOW (ref 11.6–15.9)
MCV: 94 fL (ref 79.5–101.0)
MONO#: 0 10*3/uL — ABNORMAL LOW (ref 0.1–0.9)
MONO%: 0.6 % (ref 0.0–14.0)
NEUT#: 2.5 10*3/uL (ref 1.5–6.5)
Platelets: 175 10*3/uL (ref 145–400)
RBC: 3.48 10*6/uL — ABNORMAL LOW (ref 3.70–5.45)
RDW: 17.3 % — ABNORMAL HIGH (ref 11.2–14.5)
WBC: 2.9 10*3/uL — ABNORMAL LOW (ref 3.9–10.3)

## 2013-02-16 MED ORDER — HEPARIN SOD (PORK) LOCK FLUSH 100 UNIT/ML IV SOLN
500.0000 [IU] | Freq: Once | INTRAVENOUS | Status: AC | PRN
  Administered 2013-02-16: 500 [IU]
  Filled 2013-02-16: qty 5

## 2013-02-16 MED ORDER — FAMOTIDINE IN NACL 20-0.9 MG/50ML-% IV SOLN
20.0000 mg | Freq: Once | INTRAVENOUS | Status: AC
  Administered 2013-02-16: 20 mg via INTRAVENOUS

## 2013-02-16 MED ORDER — LORAZEPAM 1 MG PO TABS
1.0000 mg | ORAL_TABLET | Freq: Once | ORAL | Status: AC | PRN
  Administered 2013-02-16: 1 mg via ORAL

## 2013-02-16 MED ORDER — PROMETHAZINE HCL 25 MG/ML IJ SOLN
25.0000 mg | Freq: Once | INTRAMUSCULAR | Status: AC
  Administered 2013-02-16: 25 mg via INTRAVENOUS
  Filled 2013-02-16: qty 1

## 2013-02-16 MED ORDER — PACLITAXEL CHEMO INJECTION 300 MG/50ML
80.0000 mg/m2 | Freq: Once | INTRAVENOUS | Status: AC
  Administered 2013-02-16: 138 mg via INTRAVENOUS
  Filled 2013-02-16: qty 23

## 2013-02-16 MED ORDER — DIPHENHYDRAMINE HCL 25 MG PO TABS
25.0000 mg | ORAL_TABLET | Freq: Once | ORAL | Status: AC
  Administered 2013-02-16: 25 mg via ORAL
  Filled 2013-02-16: qty 1

## 2013-02-16 MED ORDER — SODIUM CHLORIDE 0.9 % IJ SOLN
10.0000 mL | INTRAMUSCULAR | Status: DC | PRN
  Administered 2013-02-16: 10 mL
  Filled 2013-02-16: qty 10

## 2013-02-16 MED ORDER — DEXAMETHASONE SODIUM PHOSPHATE 20 MG/5ML IJ SOLN
20.0000 mg | Freq: Once | INTRAMUSCULAR | Status: AC
  Administered 2013-02-16: 20 mg via INTRAVENOUS

## 2013-02-16 MED ORDER — SODIUM CHLORIDE 0.9 % IV SOLN
Freq: Once | INTRAVENOUS | Status: AC
  Administered 2013-02-16: 11:00:00 via INTRAVENOUS

## 2013-02-16 NOTE — Patient Instructions (Addendum)
Plan to follow up on Sept. 26, 2014 at 2:30pm with Dr. Stanford Breed or sooner if needed.  Please call for any questions or concerns.

## 2013-02-16 NOTE — Progress Notes (Signed)
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 She was extremely restless even with decrease in IV benadryl premed to 25 mg, but otherwise has done fairly well with the first treatment. LLE pain thought related to zoster has improved over past 2-3 days. She has PAC.  Oncologic History  Patient presented with RUQ pain in March 2014, with ED evaluation then including abdominal US not remarkable. She was then found to have pelvic mass, with MRI pelvis in [facility] 11-11-12 showing centrally necrotic pelvic mass 11.4 x 9.1 x 7.8 cm. CA 125 by Dr Seymour Bars 11-13-2012 was 226.4. She saw Dr Yolande Jolly in [place] on 11-17-12 and went to surgery by him at [facility] on 11-24-12. Surgery was complete debulking including TAH, BSO, omentectomy, right ureterolysis, bilateral pelvic lymphadenectomy and periaortic lymphadenectomy. There is no mention in the operative note of rupture of the ovarian capsule. 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 Pelvic washings ([id number]) negative. Assuming the ovarian capsule was disrupted after surgery, this would be pT1a pN0 for IA staging. Patient was hospitalized at [facility] x 5 days; per patient and husband, she was transfused 2 units PRBCs at [facility]. Post operative course was unremarkable until she developed acute herpes zoster left ~ T 11-12 dermatome on 12-12-12, then severe pain left inguinal region and upper lateral left thigh such that start of dose dense adjuvant taxol carboplatin was delayed x2 prior to beginning 01-05-13. As WBC and  ANC have been low normal, will give neupogen days 2,9,16 (tho need to follow counts with this as the lower range may have been related to acute zoster).   Feeling better, mild nausea,  left lower limb spasm resolved, no fever. Bowels ok. No abdominal or pelvic pain, no bleeding. Hot flashes waking her at least every hour at night, very tired with this. No bladder symptoms.    Objective:  Vital signs in last 24 hours:  BP 113/70  Pulse 105  Temp(Src) 97.8 F (36.6 C) (Oral)  Resp 20  Ht 5\' 6"  (1.676 m)  Wt 147 lb 4.8 oz (66.815 kg)  BMI 23.79 kg/m2  LMP 10/25/2012  Weight stable. Alert, looks tired but NAD. Drinking water. No alopecia  HEENT:PERRLA, sclera clear, anicteric and oropharynx clear, no lesions. Tongue coated consistent with candida, none on buccal mucosa or posterior pharynx LymphaticsCervical, supraclavicular, and axillary nodes normal. Resp: clear to auscultation bilaterally and normal percussion bilaterally Cardio: regular rate and rhythm GI: soft, non-tender; bowel sounds normal; no masses,  no organomegaly Extremities: extremities normal, atraumatic, no cyanosis or edema Neuro:no sensory deficits noted Skin: zoster rash left flank gradually improving erythema Portacath-without erythema or tenderness  Lab Results:  Results for orders placed in visit on 02/09/13  CBC WITH DIFFERENTIAL      Result Value Range   WBC 3.2 (*) 3.9 - 10.3 10e3/uL   NEUT# 1.7  1.5 - 6.5 10e3/uL   HGB 11.4 (*) 11.6 - 15.9 g/dL   HCT 62.9 (*) 52.8 - 41.3 %   Platelets 220  145 - 400 10e3/uL   MCV 93.1  79.5 - 101.0  fL   MCH 31.8  25.1 - 34.0 pg   MCHC 34.2  31.5 - 36.0 g/dL   RBC 2.44 (*) 0.10 - 2.72 10e6/uL   RDW 17.0 (*) 11.2 - 14.5 %   lymph# 0.9  0.9 - 3.3 10e3/uL   MONO# 0.5  0.1 - 0.9 10e3/uL   Eosinophils Absolute 0.0  0.0 - 0.5 10e3/uL   Basophils Absolute 0.0  0.0 - 0.1 10e3/uL   NEUT% 54.2  38.4 - 76.8 %   LYMPH% 29.7  14.0 - 49.7 %   MONO% 14.5 (*) 0.0 - 14.0 %    EOS% 1.2  0.0 - 7.0 %   BASO% 0.4  0.0 - 2.0 %  COMPREHENSIVE METABOLIC PANEL (CC13)      Result Value Range   Sodium 146 (*) 136 - 145 mEq/L   Potassium 4.0  3.5 - 5.1 mEq/L   Chloride 107  98 - 109 mEq/L   CO2 28  22 - 29 mEq/L   Glucose 124  70 - 140 mg/dl   BUN 53.6  7.0 - 64.4 mg/dL   Creatinine 0.9  0.6 - 1.1 mg/dL   Total Bilirubin 0.34  0.20 - 1.20 mg/dL   Alkaline Phosphatase 60  40 - 150 U/L   AST 16  5 - 34 U/L   ALT 12  0 - 55 U/L   Total Protein 7.1  6.4 - 8.3 g/dL   Albumin 3.8  3.5 - 5.0 g/dL   Calcium 9.9  8.4 - 74.2 mg/dL  CA 595      Result Value Range   CA 125 37.7 (*) 0.0 - 30.2 U/mL     Studies/Results:  No results found.  Medications: I have reviewed the patient's current medications. Will have phenergan available in case she can tell that she is not tolerating compazine; note HA with zofran. Diflucan 100 mg daily x 7 for oral thrush. Will decrease premed decadron to 20 mg 12 hrs prior to taxol + the IV decadron. Will change premed benadryl to 25 mg po in hopes that this will lessen the restlessness; prn ativan added to premeds for restlessness. She will try Vitamin E 400 mg bid or 800 mg at hs for hot flashes; she may need to discuss short term estrogen with Dr Yolande Jolly Note she continues q 8 hr flexeril for the LLE pain. We have discussed gradually tapering this when the leg pain improves.  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 She had day 8 cycle 1 taxol on 01-12-13 with neupogen on 7-2, then day 15 cycle 1 taxol on 7-8.  She received her day 1 cycle 2 carbo + taxol  on 7-15 and neupogen on 01-27-13, was supposed to have her C2 D8 on 7/22 but she was neutropenic, therefore, D8 was delayed a week. She will receive her Day 15 today  02/16/2013 then come next week to start Cycle # 3 day 1 on 8/12, neupogen on 8/13 &14 then day 8 on 8/19 with neupogen on 8/20&21 then Day 15 on 8/26 with neupogen on 8/27&28 then Cycle 4 D1 on 9/2.  2.acute herpes zoster: improved. Will use prophylactic acyclovir for duration of chemo  3. Left groin and left lateral thigh pain: likely also related to the zoster, tho some mild degenerative changes on  imaging; nothing obvious on CT pelvis to cause this. Better with prn flexeril for now.  6.hypothyroid on replacement  3.lactose and gluten intolerance  4.PAC in, placed at [facility].  5.oral candida: diflucan as above 6.intolerance to zofran with HA, severe restless legs with benadryl, possible intolerance to compazine.   Patient may need note to return to work on part time schedule initially. She is in agreement with plan above.   Zachery Dakins, MD   02/16/2013, 8:55 AM

## 2013-02-16 NOTE — Progress Notes (Signed)
Consult Note: Gyn-Onc   Candice Zamora 47 y.o. female  Chief Complaint  Patient presents with  . 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 Resolved leg pain. Plan: We will continue dose dense carboplatin and Taxol through a total of 6 cycles. I will plan on seeing the patient back again on 04/09/2013.    Interval history: The patient returns today as previously scheduled. She is now completed 2 cycles of dose dense carboplatin and Taxol chemotherapy. She's tolerating the therapy quite well with no evidence of neuropathy or significant GI toxicity. It's noted that her left leg pain is now resolved.Marland Kitchen Appetite is good she has no GI or GU symptoms.  Her functional status is much improved and she is planning on returning to work part-time basis. Noted that her last CA 125 was 37 units per m (02/09/2013)    HPI:  89 her old white married female gravida 4 para 2 seen in consultation request of Dr.Marie-Lyne Seymour Bars regarding management of a newly diagnosed complex pelvic mass. The patient has had several months of lower nominal pain and initially underwent a pelvic ultrasound on April 28 that showed a 10.8 x 8.7 x 9.2 cm heterogeneous pelvic mass. Small fibroids were also noted. The adnexa were poorly seen. Subsequently she underwent a MRI of the pelvis which demonstrated a centrally necrotic pelvic mass which may arise from the posterior uterus or the right adnexa. She had a normal-appearing left ovary moderate ascites and no evidence of adenopathy. CA 125 is 226 units per mL CEA 1.5 (normal)OVA1 was 9.4 (upper limits of normal for premenopausal patient is 5) Patient underwent exploratory laparotomy, TAH, BSO, omentectomy, pelvic and aortic lymphadenectomy at [facility] on 11/24/2012. She had 11 x 9 x 7 cm right adnexal mass which was densely adherent to the right pelvic sidewall (stage IIB and (. Final pathology showed endometrioid adenocarcinoma  grade 3. All other biopsies including pelvic and para-aortic lymphadenectomy were negative.  Postoperatively she was treated with dose dense carboplatin and Taxol. Review of Systems:10 point review of systems is negative except as noted in interval history.   Vitals: Blood pressure 98/60, pulse 72, temperature 98.2 F (36.8 C), temperature source Oral, resp. rate 16, height 5\' 6"  (1.676 m), weight 147 lb 1.6 oz (66.724 kg), last menstrual period 10/25/2012.  Physical Exam: General : The patient is a healthy woman in no acute distress.  HEENT: normocephalic, extraoccular movements normal; neck is supple without thyromegally  Lynphnodes: Supraclavicular and inguinal nodes not enlarged  Abdomen: Soft, non-tender, no ascites, no organomegally., no hernias, incision is healing well  Pelvic:  EGBUS: Normal female  Vagina: Normal, no lesions , the cuff is healed. Urethra and Bladder: Normal, non-tender  Cervix: Surgically absent   Uterus: I surgically absent   Rectal: normal sphincter ton, confirmed pelvic mass., no blood  Lower extremities: No edema or varicosities. Normal range of motion      Allergies  Allergen Reactions  . Compazine (Prochlorperazine Maleate) Other (See Comments)    Jittery  . Zofran (Ondansetron Hcl) Other (See Comments)    headache  . Sulfa Antibiotics Rash    Past Medical History  Diagnosis Date  . Ectopic pregnancy   . History of chicken pox   . Pelvic mass in female   . Abdominal pain   . Infertility, female   . Hemorrhoids   . Goiter   . Hypothyroidism   . Dermoid cyst  Past Surgical History  Procedure Laterality Date  . Laparoscopy for ectopic pregnancy      10 years ago    Current Outpatient Prescriptions  Medication Sig Dispense Refill  . acyclovir (ZOVIRAX) 200 MG capsule Take 1 capsule (200 mg total) by mouth 3 (three) times daily.  90 capsule  5  . cyclobenzaprine (FLEXERIL) 10 MG tablet Take 1 tablet (10 mg total) by mouth 3  (three) times daily as needed for muscle spasms.  20 tablet  1  . dexamethasone (DECADRON) 4 MG tablet 5 tablets (20 mg) with food 12 hours and 6 hours prior to chemo  30 tablet  0  . ibuprofen (ADVIL,MOTRIN) 600 MG tablet       . levothyroxine (SYNTHROID, LEVOTHROID) 50 MCG tablet Take 50 mcg by mouth daily.      Marland Kitchen lidocaine-prilocaine (EMLA) cream Apply topically as needed. To port prior to access  30 g  1  . loratadine (CLARITIN) 10 MG tablet Take 10 mg by mouth daily.      Marland Kitchen LORazepam (ATIVAN) 1 MG tablet 1/2- 1 tablet by mouth or under tongue every 4-6 hours as needed for nausea. Will make you drowsy  20 tablet  0  . oxyCODONE-acetaminophen (PERCOCET/ROXICET) 5-325 MG per tablet Take 1 tablet by mouth every 6 (six) hours as needed for pain.  15 tablet  0  . pregabalin (LYRICA) 75 MG capsule Take 2 tabs in am and one at hs  90 capsule  0  . promethazine (PHENERGAN) 25 MG tablet Take 1 tablet (25 mg total) by mouth every 6 (six) hours as needed for nausea.  30 tablet  1  . traMADol (ULTRAM) 50 MG tablet Take 50-100 mg by mouth every 6 (six) hours as needed for pain.       No current facility-administered medications for this visit.    History   Social History  . Marital Status: Married    Spouse Name: N/A    Number of Children: N/A  . Years of Education: N/A   Occupational History  . Not on file.   Social History Main Topics  . Smoking status: Never Smoker   . Smokeless tobacco: Not on file  . Alcohol Use: No  . Drug Use: No  . Sexually Active: Not on file   Other Topics Concern  . Not on file   Social History Narrative  . No narrative on file    Family History  Problem Relation Age of Onset  . Hypothyroidism Mother   . Heart disease Mother   . Uterine cancer Mother   . Heart disease Father   . Heart attack Father   . Hypothyroidism Sister   . Breast cancer Maternal Aunt   . Diabetes Maternal Grandfather   . Diabetes Paternal Grandmother        Jeannette Corpus, MD 02/16/2013, 10:33 AM

## 2013-02-16 NOTE — Patient Instructions (Addendum)
[facility] Discharge Instructions for Patients Receiving Chemotherapy  Today you received the following chemotherapy agents: Taxol.  To help prevent nausea and vomiting after your treatment, we encourage you to take your nausea medication as prescribed.   If you develop nausea and vomiting that is not controlled by your nausea medication, call the clinic.   BELOW ARE SYMPTOMS THAT SHOULD BE REPORTED IMMEDIATELY:  *FEVER GREATER THAN 100.5 F  *CHILLS WITH OR WITHOUT FEVER  NAUSEA AND VOMITING THAT IS NOT CONTROLLED WITH YOUR NAUSEA MEDICATION  *UNUSUAL SHORTNESS OF BREATH  *UNUSUAL BRUISING OR BLEEDING  TENDERNESS IN MOUTH AND THROAT WITH OR WITHOUT PRESENCE OF ULCERS  *URINARY PROBLEMS  *BOWEL PROBLEMS  UNUSUAL RASH Items with * indicate a potential emergency and should be followed up as soon as possible.  Feel free to call the clinic you have any questions or concerns. The clinic phone number is [phone].    

## 2013-02-17 ENCOUNTER — Ambulatory Visit (HOSPITAL_BASED_OUTPATIENT_CLINIC_OR_DEPARTMENT_OTHER): Payer: 59

## 2013-02-17 ENCOUNTER — Telehealth: Payer: Self-pay | Admitting: Hematology and Oncology

## 2013-02-17 ENCOUNTER — Telehealth: Payer: Self-pay

## 2013-02-17 VITALS — BP 99/56 | HR 81 | Temp 97.9°F

## 2013-02-17 DIAGNOSIS — C569 Malignant neoplasm of unspecified ovary: Secondary | ICD-10-CM

## 2013-02-17 DIAGNOSIS — Z5189 Encounter for other specified aftercare: Secondary | ICD-10-CM

## 2013-02-17 MED ORDER — FILGRASTIM 300 MCG/0.5ML IJ SOLN
300.0000 ug | Freq: Once | INTRAMUSCULAR | Status: AC
  Administered 2013-02-17: 300 ug via SUBCUTANEOUS
  Filled 2013-02-17: qty 0.5

## 2013-02-17 NOTE — Telephone Encounter (Signed)
lvm for pt about today and nxt weeks appt...advised pt to pick up updated sched at nxt visit

## 2013-02-17 NOTE — Telephone Encounter (Signed)
Gave Candice Zamora a note to return to work as tolerated the week of 02-22-13 as patient discussed with Dr. Karel Jarvis at visit 02-16-13.

## 2013-02-23 ENCOUNTER — Other Ambulatory Visit: Payer: Self-pay | Admitting: Hematology and Oncology

## 2013-02-23 ENCOUNTER — Other Ambulatory Visit (HOSPITAL_BASED_OUTPATIENT_CLINIC_OR_DEPARTMENT_OTHER): Payer: 59 | Admitting: Lab

## 2013-02-23 ENCOUNTER — Telehealth: Payer: Self-pay | Admitting: Hematology and Oncology

## 2013-02-23 ENCOUNTER — Ambulatory Visit (HOSPITAL_BASED_OUTPATIENT_CLINIC_OR_DEPARTMENT_OTHER): Payer: 59

## 2013-02-23 ENCOUNTER — Encounter: Payer: Self-pay | Admitting: Physician Assistant

## 2013-02-23 ENCOUNTER — Ambulatory Visit (HOSPITAL_BASED_OUTPATIENT_CLINIC_OR_DEPARTMENT_OTHER): Payer: 59 | Admitting: Physician Assistant

## 2013-02-23 ENCOUNTER — Telehealth: Payer: Self-pay | Admitting: *Deleted

## 2013-02-23 VITALS — BP 117/65 | HR 89 | Temp 98.0°F | Resp 20 | Ht 66.0 in | Wt 151.0 lb

## 2013-02-23 DIAGNOSIS — C569 Malignant neoplasm of unspecified ovary: Secondary | ICD-10-CM

## 2013-02-23 DIAGNOSIS — Z5111 Encounter for antineoplastic chemotherapy: Secondary | ICD-10-CM

## 2013-02-23 DIAGNOSIS — R197 Diarrhea, unspecified: Secondary | ICD-10-CM

## 2013-02-23 DIAGNOSIS — K5900 Constipation, unspecified: Secondary | ICD-10-CM

## 2013-02-23 DIAGNOSIS — C561 Malignant neoplasm of right ovary: Secondary | ICD-10-CM

## 2013-02-23 LAB — CBC WITH DIFFERENTIAL/PLATELET
Eosinophils Absolute: 0 10*3/uL (ref 0.0–0.5)
HCT: 33.1 % — ABNORMAL LOW (ref 34.8–46.6)
LYMPH%: 11.2 % — ABNORMAL LOW (ref 14.0–49.7)
MONO#: 0 10*3/uL — ABNORMAL LOW (ref 0.1–0.9)
NEUT#: 2.7 10*3/uL (ref 1.5–6.5)
NEUT%: 88.5 % — ABNORMAL HIGH (ref 38.4–76.8)
Platelets: 175 10*3/uL (ref 145–400)
RBC: 3.47 10*6/uL — ABNORMAL LOW (ref 3.70–5.45)
WBC: 3 10*3/uL — ABNORMAL LOW (ref 3.9–10.3)
nRBC: 0 % (ref 0–0)

## 2013-02-23 LAB — COMPREHENSIVE METABOLIC PANEL (CC13)
ALT: 12 U/L (ref 0–55)
Albumin: 3.9 g/dL (ref 3.5–5.0)
CO2: 25 mEq/L (ref 22–29)
Calcium: 9.9 mg/dL (ref 8.4–10.4)
Chloride: 106 mEq/L (ref 98–109)
Creatinine: 0.9 mg/dL (ref 0.6–1.1)
Potassium: 4.2 mEq/L (ref 3.5–5.1)

## 2013-02-23 MED ORDER — SODIUM CHLORIDE 0.9 % IJ SOLN
10.0000 mL | INTRAMUSCULAR | Status: DC | PRN
  Administered 2013-02-23: 10 mL
  Filled 2013-02-23: qty 10

## 2013-02-23 MED ORDER — HEPARIN SOD (PORK) LOCK FLUSH 100 UNIT/ML IV SOLN
500.0000 [IU] | Freq: Once | INTRAVENOUS | Status: AC | PRN
  Administered 2013-02-23: 500 [IU]
  Filled 2013-02-23: qty 5

## 2013-02-23 MED ORDER — SODIUM CHLORIDE 0.9 % IV SOLN
Freq: Once | INTRAVENOUS | Status: AC
  Administered 2013-02-23: 16:00:00 via INTRAVENOUS

## 2013-02-23 MED ORDER — LORAZEPAM 1 MG PO TABS
1.0000 mg | ORAL_TABLET | Freq: Once | ORAL | Status: AC | PRN
  Administered 2013-02-23: 1 mg via ORAL

## 2013-02-23 MED ORDER — PACLITAXEL CHEMO INJECTION 300 MG/50ML
80.0000 mg/m2 | Freq: Once | INTRAVENOUS | Status: AC
  Administered 2013-02-23: 138 mg via INTRAVENOUS
  Filled 2013-02-23: qty 23

## 2013-02-23 MED ORDER — FAMOTIDINE IN NACL 20-0.9 MG/50ML-% IV SOLN
20.0000 mg | Freq: Once | INTRAVENOUS | Status: AC
  Administered 2013-02-23: 20 mg via INTRAVENOUS

## 2013-02-23 MED ORDER — SODIUM CHLORIDE 0.9 % IV SOLN
540.0000 mg | Freq: Once | INTRAVENOUS | Status: AC
  Administered 2013-02-23: 540 mg via INTRAVENOUS
  Filled 2013-02-23: qty 54

## 2013-02-23 MED ORDER — DEXAMETHASONE SODIUM PHOSPHATE 20 MG/5ML IJ SOLN
20.0000 mg | Freq: Once | INTRAMUSCULAR | Status: AC
  Administered 2013-02-23: 20 mg via INTRAVENOUS

## 2013-02-23 MED ORDER — PROMETHAZINE HCL 25 MG/ML IJ SOLN
25.0000 mg | Freq: Once | INTRAMUSCULAR | Status: AC
  Administered 2013-02-23: 25 mg via INTRAVENOUS
  Filled 2013-02-23: qty 1

## 2013-02-23 MED ORDER — DIPHENHYDRAMINE HCL 25 MG PO TABS
25.0000 mg | ORAL_TABLET | Freq: Once | ORAL | Status: AC
  Administered 2013-02-23: 25 mg via ORAL
  Filled 2013-02-23: qty 1

## 2013-02-23 NOTE — Telephone Encounter (Signed)
Per staff message and POF I have scheduled appts.  JMW  

## 2013-02-23 NOTE — Telephone Encounter (Signed)
gv and printed appt sched and avs for pt...emailed MW to add tx... °

## 2013-02-23 NOTE — Patient Instructions (Addendum)
[facility] Discharge Instructions for Patients Receiving Chemotherapy  Today you received the following chemotherapy agents: taxol, carboplatin.  To help prevent nausea and vomiting after your treatment, we encourage you to take your nausea medication.  If you develop nausea and vomiting that is not controlled by your nausea medication, call the clinic.   BELOW ARE SYMPTOMS THAT SHOULD BE REPORTED IMMEDIATELY:  *FEVER GREATER THAN 100.5 F  *CHILLS WITH OR WITHOUT FEVER  NAUSEA AND VOMITING THAT IS NOT CONTROLLED WITH YOUR NAUSEA MEDICATION  *UNUSUAL SHORTNESS OF BREATH  *UNUSUAL BRUISING OR BLEEDING  TENDERNESS IN MOUTH AND THROAT WITH OR WITHOUT PRESENCE OF ULCERS  *URINARY PROBLEMS  *BOWEL PROBLEMS  UNUSUAL RASH Items with * indicate a potential emergency and should be followed up as soon as possible.  Feel free to call the clinic you have any questions or concerns. The clinic phone number is [phone].

## 2013-02-23 NOTE — Telephone Encounter (Signed)
, °

## 2013-02-24 ENCOUNTER — Ambulatory Visit (HOSPITAL_BASED_OUTPATIENT_CLINIC_OR_DEPARTMENT_OTHER): Payer: 59

## 2013-02-24 VITALS — BP 104/54 | HR 73 | Temp 97.8°F

## 2013-02-24 DIAGNOSIS — C569 Malignant neoplasm of unspecified ovary: Secondary | ICD-10-CM

## 2013-02-24 DIAGNOSIS — Z5189 Encounter for other specified aftercare: Secondary | ICD-10-CM

## 2013-02-24 MED ORDER — FILGRASTIM 300 MCG/0.5ML IJ SOLN
300.0000 ug | Freq: Once | INTRAMUSCULAR | Status: AC
  Administered 2013-02-24: 300 ug via SUBCUTANEOUS
  Filled 2013-02-24: qty 0.5

## 2013-02-24 MED ORDER — FILGRASTIM 300 MCG/0.5ML IJ SOLN: 300.0000 ug | Freq: Once | INTRAMUSCULAR | Status: DC

## 2013-02-24 MED ORDER — FILGRASTIM 300 MCG/0.5ML IJ SOLN
300.0000 ug | Freq: Once | INTRAMUSCULAR | Status: DC
  Filled 2013-02-24: qty 0.5

## 2013-02-25 ENCOUNTER — Ambulatory Visit (HOSPITAL_BASED_OUTPATIENT_CLINIC_OR_DEPARTMENT_OTHER): Payer: 59

## 2013-02-25 VITALS — BP 98/51 | HR 73 | Temp 97.7°F

## 2013-02-25 DIAGNOSIS — Z5189 Encounter for other specified aftercare: Secondary | ICD-10-CM

## 2013-02-25 DIAGNOSIS — C569 Malignant neoplasm of unspecified ovary: Secondary | ICD-10-CM

## 2013-02-25 MED ORDER — FILGRASTIM 300 MCG/0.5ML IJ SOLN
300.0000 ug | Freq: Once | INTRAMUSCULAR | Status: AC
  Administered 2013-02-25: 300 ug via SUBCUTANEOUS
  Filled 2013-02-25: qty 0.5

## 2013-02-26 ENCOUNTER — Telehealth: Payer: Self-pay | Admitting: Oncology

## 2013-02-26 ENCOUNTER — Other Ambulatory Visit: Payer: Self-pay | Admitting: Physician Assistant

## 2013-02-26 DIAGNOSIS — C569 Malignant neoplasm of unspecified ovary: Secondary | ICD-10-CM

## 2013-02-26 NOTE — Telephone Encounter (Signed)
, °

## 2013-02-26 NOTE — Progress Notes (Signed)
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 She was extremely restless even with decrease in IV benadryl premed to 25 mg, but otherwise has done fairly well with the first treatment. LLE pain thought related to zoster has improved over past 2-3 days. She has PAC. She presents today to start cycle #3 of her systemic chemotherapy.   Oncologic History  Patient presented with RUQ pain in March 2014, with ED evaluation then including abdominal US not remarkable. She was then found to have pelvic mass, with MRI pelvis in [place] system 11-11-12 showing centrally necrotic pelvic mass 11.4 x 9.1 x 7.8 cm. CA 125 by Dr Seymour Bars 11-13-2012 was 226.4. She saw Dr Yolande Jolly in [place] on 11-17-12 and went to surgery by him at [facility] on 11-24-12. Surgery was complete debulking including TAH, BSO, omentectomy, right ureterolysis, bilateral pelvic lymphadenectomy and periaortic lymphadenectomy. There is no mention in the operative note of rupture of the ovarian capsule. 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 Pelvic washings ([id number]) negative. Assuming the ovarian capsule was disrupted after surgery, this would be pT1a pN0 for IA staging. Patient was hospitalized at [facility] x 5 days; per patient and husband, she was transfused 2 units PRBCs at [facility]. Post operative course was unremarkable until she developed acute herpes zoster left ~ T 11-12 dermatome on 12-12-12, then severe pain left inguinal region and upper lateral left thigh such that start of dose dense adjuvant taxol  carboplatin was delayed x2 prior to beginning 01-05-13. As WBC and ANC have been low normal, will give neupogen days 2,9,16 (tho need to follow counts with this as the lower range may have been related to acute zoster).   She reports that her "shingles" rash is completely gone. She continues on prophylactic acyclovir 200 mg by mouth twice daily. She states that the Claritin is helpful with the pain from the Neupogen injections. She has occasional diarrhea alternating with constipation. She denied fever. She voiced no other specific complaints today.  No abdominal or pelvic pain, no bleeding.  No bladder symptoms.    Objective:  Vital signs in last 24 hours:  BP 117/65  Pulse 89  Temp(Src) 98 F (36.7 C) (Oral)  Resp 20  Ht 5\' 6"  (1.676 m)  Wt 151 lb (68.493 kg)  BMI 24.38 kg/m2  LMP 10/25/2012  Weight stable. Alert, looks tired but NAD. Drinking water. No alopecia  HEENT:PERRLA, sclera clear, anicteric and oropharynx clear, no lesions. Tongue coated consistent with candida, none on buccal mucosa or posterior pharynx LymphaticsCervical, supraclavicular, and axillary nodes normal. Resp: clear to auscultation bilaterally and normal percussion bilaterally Cardio: regular rate and rhythm GI: soft, non-tender; bowel sounds normal; no masses,  no organomegaly Extremities: extremities normal, atraumatic, no cyanosis or edema Neuro:no sensory deficits noted Skin: zoster rash left flank gradually improving erythema Portacath-without erythema or tenderness  Lab Results:  Results for orders placed in visit on 02/23/13  CBC WITH DIFFERENTIAL      Result Value Range   WBC 3.0 (*) 3.9 - 10.3 10e3/uL   NEUT# 2.7  1.5 - 6.5 10e3/uL  HGB 10.9 (*) 11.6 - 15.9 g/dL   HCT 40.9 (*) 81.1 - 91.4 %   Platelets 175  145 - 400 10e3/uL   MCV 95.4  79.5 - 101.0 fL   MCH 31.4  25.1 - 34.0 pg   MCHC 32.9  31.5 - 36.0 g/dL   RBC 7.82 (*) 9.56 - 2.13 10e6/uL   RDW 16.0 (*) 11.2 - 14.5 %   lymph# 0.3  (*) 0.9 - 3.3 10e3/uL   MONO# 0.0 (*) 0.1 - 0.9 10e3/uL   Eosinophils Absolute 0.0  0.0 - 0.5 10e3/uL   Basophils Absolute 0.0  0.0 - 0.1 10e3/uL   NEUT% 88.5 (*) 38.4 - 76.8 %   LYMPH% 11.2 (*) 14.0 - 49.7 %   MONO% 0.3  0.0 - 14.0 %   EOS% 0.0  0.0 - 7.0 %   BASO% 0.0  0.0 - 2.0 %   nRBC 0  0 - 0 %  COMPREHENSIVE METABOLIC PANEL (CC13)      Result Value Range   Sodium 141  136 - 145 mEq/L   Potassium 4.2  3.5 - 5.1 mEq/L   Chloride 106  98 - 109 mEq/L   CO2 25  22 - 29 mEq/L   Glucose 157 (*) 70 - 140 mg/dl   BUN 08.6  7.0 - 57.8 mg/dL   Creatinine 0.9  0.6 - 1.1 mg/dL   Total Bilirubin 4.69  0.20 - 1.20 mg/dL   Alkaline Phosphatase 61  40 - 150 U/L   AST 14  5 - 34 U/L   ALT 12  0 - 55 U/L   Total Protein 7.5  6.4 - 8.3 g/dL   Albumin 3.9  3.5 - 5.0 g/dL   Calcium 9.9  8.4 - 62.9 mg/dL     Studies/Results:  No results found.  Medications: I have reviewed the patient's current medications.  Patient discussed with Dr. Karel Jarvis.  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 She had day 8 cycle 1 taxol on 01-12-13 with neupogen on 7-2, then day 15 cycle 1 taxol on 7-8.  She received her day 1 cycle 2 carbo + taxol  on 7-15 and neupogen on 01-27-13, was supposed to have her C2 D8 on 7/22 but she was neutropenic, therefore, D8 was delayed a week. She received her Day 15 on 02/16/2013 then come next week to start Cycle # 3 day 1 on 8/12, neupogen on 8/13 &14 then day 8 on 8/19 with neupogen on 8/20&21 then Day 15 on 8/26 with neupogen on 8/27&28 then Cycle 4 D1 on 9/2.  2.acute herpes zoster: resolved.  Will use prophylactic acyclovir for duration of chemo  3. Left groin and left lateral thigh pain: likely also related to the zoster - also resolved.  6.hypothyroid on  replacement  3.lactose and gluten intolerance  4.PAC in, placed at [facility]  5.oral candida: resolved 6.intolerance to zofran with HA, severe restless legs with benadryl, possible intolerance to compazine.   She will followup on 03/16/2013 prior to start of cycle #4. She is in agreement with plan above.   Laural Benes, Victoriya Pol E, PA-C

## 2013-02-26 NOTE — Patient Instructions (Addendum)
Continue Lasix and chemotherapy as scheduled including Neupogen injections. Followup in 3 weeks for another symptom management visit prior to your next scheduled cycle of chemotherapy to begin on 03/17/2003 to

## 2013-03-01 ENCOUNTER — Ambulatory Visit: Payer: 59 | Attending: Oncology | Admitting: Oncology

## 2013-03-01 ENCOUNTER — Telehealth: Payer: Self-pay | Admitting: Oncology

## 2013-03-01 ENCOUNTER — Other Ambulatory Visit: Payer: Self-pay | Admitting: Oncology

## 2013-03-01 ENCOUNTER — Encounter: Payer: Self-pay | Admitting: Oncology

## 2013-03-01 ENCOUNTER — Other Ambulatory Visit (HOSPITAL_BASED_OUTPATIENT_CLINIC_OR_DEPARTMENT_OTHER): Payer: 59

## 2013-03-01 VITALS — BP 103/68 | HR 94 | Temp 98.4°F | Resp 18 | Ht 66.0 in | Wt 148.6 lb

## 2013-03-01 DIAGNOSIS — R1900 Intra-abdominal and pelvic swelling, mass and lump, unspecified site: Secondary | ICD-10-CM

## 2013-03-01 DIAGNOSIS — C569 Malignant neoplasm of unspecified ovary: Secondary | ICD-10-CM

## 2013-03-01 DIAGNOSIS — C561 Malignant neoplasm of right ovary: Secondary | ICD-10-CM

## 2013-03-01 LAB — CBC WITH DIFFERENTIAL/PLATELET
BASO%: 0.7 % (ref 0.0–2.0)
EOS%: 3.9 % (ref 0.0–7.0)
HCT: 31.2 % — ABNORMAL LOW (ref 34.8–46.6)
LYMPH%: 37.8 % (ref 14.0–49.7)
MCH: 32.7 pg (ref 25.1–34.0)
MCHC: 34 g/dL (ref 31.5–36.0)
MCV: 96.3 fL (ref 79.5–101.0)
MONO%: 9.9 % (ref 0.0–14.0)
NEUT%: 47.7 % (ref 38.4–76.8)
Platelets: 175 10*3/uL (ref 145–400)

## 2013-03-01 LAB — COMPREHENSIVE METABOLIC PANEL (CC13)
ALT: 18 U/L (ref 0–55)
AST: 17 U/L (ref 5–34)
CO2: 26 mEq/L (ref 22–29)
Creatinine: 0.8 mg/dL (ref 0.6–1.1)
Total Bilirubin: 0.9 mg/dL (ref 0.20–1.20)

## 2013-03-01 MED ORDER — DEXAMETHASONE 4 MG PO TABS: ORAL_TABLET | ORAL | Status: DC

## 2013-03-01 MED ORDER — PROMETHAZINE HCL 25 MG RE SUPP: 25.0000 mg | Freq: Four times a day (QID) | RECTAL | Status: DC | PRN

## 2013-03-01 NOTE — Progress Notes (Signed)
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 Course has been complicated by acute herpes zoster involving LLE, now resolved including the accompanying severe LLE muscle spasms and for which she continues prophylactic acyclovir due to steroid premedication; by extreme restlessness from necessary premedication benadryl for taxol; and by low WBC requiring gCSF support. Most recent chemotherapy on 02-23-13 was day 1 cycle 3 with carboplatin still at the lower dosing begun with cycle 2, and with neupogen 300 mcg given on 8-13 and 02-25-13. Neupogen aches are controlled with claritin daily. Preoperative CA 125 on 11-13-12 was 226; this was 29 on 01-19-13 and 37 on July 27. She has PAC.   Oncologic History  Patient presented with RUQ pain in March 2014, with ED evaluation then including abdominal US not remarkable. She was then found to have pelvic mass, with MRI pelvis in [place] system 11-11-12 showing centrally necrotic pelvic mass 11.4 x 9.1 x 7.8 cm. CA 125 by Dr Seymour Bars 11-13-2012 was 226.4. She saw Dr Yolande Jolly in [place] on 11-17-12 and went to surgery by him at [facility] on 11-24-12. Surgery was complete debulking including TAH, BSO, omentectomy, right ureterolysis, bilateral pelvic lymphadenectomy and periaortic lymphadenectomy. There is no mention in the operative note of rupture of the ovarian capsule. 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 Pelvic washings ([id number]) negative. Assuming the ovarian capsule was disrupted after surgery, this would be  pT1a pN0 for IA staging. Patient was hospitalized at [facility] x 5 days; per patient and husband, she was transfused 2 units PRBCs at [facility]. Post operative course was unremarkable until she developed acute herpes zoster left ~ T 11-12 dermatome on 12-12-12, then severe pain left inguinal region and upper lateral left thigh such that start of dose dense adjuvant taxol carboplatin was delayed x2 prior to beginning 01-05-13. She has required neupogen x 1-2 days after each treatment.  Note preoperative CA 125 on 11-13-12 was 226 and was 157 post operatively on 12-08-12; this was down to 29 on 01-19-13 and was 37 on July 27.  Review of Systems as above, also nausea beginning day 3 pm and severe on day 4 to point that she was unable to keep down oral phenergan. Note she is intolerant to ondansetron due to HA, intolerant to prochlorperazine due to restlessness and dislikes ativan. She was back at work part time last week, but unable to work on day 4 (Fri). Bowels are moving and she tends to have slight loose stools day after taxol. PAC functioning well. No respiratory symptoms. No peripheral neuropathy. Is mostly able to eat, still dislikes drinking liquids. Fatigue was significant days 5 and 6, unable to do housework. No bleeding. No pain now. Remainder of 10 point Review of Systems negative.  Objective:  Vital signs in last 24 hours:  BP 103/68  Pulse 94  Temp(Src) 98.4 F (36.9 C) (Oral)  Resp 18  Ht 5\' 6"  (1.676 m)  Wt 148 lb 9.6 oz (67.405 kg)  BMI 24 kg/m2  LMP 10/25/2012 Easily ambulatory and conversant, NAD. Complete alopecia.   HEENT: PERRL, not icteric. Oral  mucosa moist and clear. Neck supple. Lymphatics: no peripheral adenopathy cervical, supraclavicular, inguinal Resp: clear to A and P Cardio: RRR no murmur or gallop GI: soft and nontender including epigastrium, normal bowel sounds, midline scar not remarkable Extremities: no pitting edema, cords or tenderness Neuro: no peripheral neuropathy and  otherwise nonfocal Skin: residual mild erythema in region of zoster left flank  Portacath -without erythema or tenderness.  Lab Results:  Results for orders placed in visit on 03/01/13  CBC WITH DIFFERENTIAL      Result Value Range   WBC 2.2 (*) 3.9 - 10.3 10e3/uL   NEUT# 1.0 (*) 1.5 - 6.5 10e3/uL   HGB 10.6 (*) 11.6 - 15.9 g/dL   HCT 82.9 (*) 56.2 - 13.0 %   Platelets 175  145 - 400 10e3/uL   MCV 96.3  79.5 - 101.0 fL   MCH 32.7  25.1 - 34.0 pg   MCHC 34.0  31.5 - 36.0 g/dL   RBC 8.65 (*) 7.84 - 6.96 10e6/uL   RDW 17.4 (*) 11.2 - 14.5 %   lymph# 0.8 (*) 0.9 - 3.3 10e3/uL   MONO# 0.2  0.1 - 0.9 10e3/uL   Eosinophils Absolute 0.1  0.0 - 0.5 10e3/uL   Basophils Absolute 0.0  0.0 - 0.1 10e3/uL   NEUT% 47.7  38.4 - 76.8 %   LYMPH% 37.8  14.0 - 49.7 %   MONO% 9.9  0.0 - 14.0 %   EOS% 3.9  0.0 - 7.0 %   BASO% 0.7  0.0 - 2.0 %  COMPREHENSIVE METABOLIC PANEL (CC13)      Result Value Range   Sodium 143  136 - 145 mEq/L   Potassium 4.2  3.5 - 5.1 mEq/L   Chloride 107  98 - 109 mEq/L   CO2 26  22 - 29 mEq/L   Glucose 93  70 - 140 mg/dl   BUN 29.5  7.0 - 28.4 mg/dL   Creatinine 0.8  0.6 - 1.1 mg/dL   Total Bilirubin 1.32  0.20 - 1.20 mg/dL   Alkaline Phosphatase 60  40 - 150 U/L   AST 17  5 - 34 U/L   ALT 18  0 - 55 U/L   Total Protein 6.8  6.4 - 8.3 g/dL   Albumin 3.8  3.5 - 5.0 g/dL   Calcium 9.5  8.4 - 44.0 mg/dL     Studies/Results:  No results found.  Medications: I have reviewed the patient's current medications. Will have phenergan 25 mg suppositories available in case nausea prevents use of oral phenergan. We will decrease premedication benadryl to 12.5 mg po >=30 min prior to taxol. She continues claritin and prophylactic acyclovir.  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 ANC 1.0 today ok for single agent low dose taxol as planned day 8 cycle 3 03-02-13 with neupogen on 8-20 and 03-04-13. Phenergan suppositories if nausea prevents use of oral phenergan (or IVF with IV antiemetics if needed). Follow CA 125. 2.acute herpes zoster: resolved. Will use prophylactic acyclovir for duration of chemo due to premedication steroids.  3. Left groin and left lateral thigh pain: likely also related to the zoster and seems resolved now. She did see Dr Penni Bombard of orthopedics but did not have MRI due to improvement in symptoms. 6.hypothyroid on replacement  3.lactose  and gluten intolerance  4.PAC in, placed at [facility].  5.oral candida previously, none now 6.intolerance to zofran with HA, severe restless legs with benadryl, intolerance to compazine, dislikes ativan. Using primarily phenergan for nausea.   Schedule to be coordinated with this MD now. Patient in agreement with plan.   Kimberlie Csaszar P, MD   03/01/2013, 9:37 AM

## 2013-03-02 ENCOUNTER — Ambulatory Visit: Payer: 59

## 2013-03-02 ENCOUNTER — Ambulatory Visit (HOSPITAL_BASED_OUTPATIENT_CLINIC_OR_DEPARTMENT_OTHER): Payer: 59

## 2013-03-02 ENCOUNTER — Other Ambulatory Visit: Payer: 59 | Admitting: Lab

## 2013-03-02 VITALS — BP 113/63 | HR 101 | Temp 98.2°F | Resp 18

## 2013-03-02 DIAGNOSIS — C569 Malignant neoplasm of unspecified ovary: Secondary | ICD-10-CM

## 2013-03-02 DIAGNOSIS — Z5111 Encounter for antineoplastic chemotherapy: Secondary | ICD-10-CM

## 2013-03-02 MED ORDER — PROMETHAZINE HCL 25 MG/ML IJ SOLN
25.0000 mg | Freq: Once | INTRAMUSCULAR | Status: AC
  Administered 2013-03-02: 25 mg via INTRAVENOUS
  Filled 2013-03-02: qty 1

## 2013-03-02 MED ORDER — LORAZEPAM 1 MG PO TABS: 1.0000 mg | ORAL_TABLET | Freq: Once | ORAL | Status: DC | PRN

## 2013-03-02 MED ORDER — FAMOTIDINE IN NACL 20-0.9 MG/50ML-% IV SOLN
20.0000 mg | Freq: Once | INTRAVENOUS | Status: AC
  Administered 2013-03-02: 20 mg via INTRAVENOUS

## 2013-03-02 MED ORDER — SODIUM CHLORIDE 0.9 % IJ SOLN
10.0000 mL | INTRAMUSCULAR | Status: DC | PRN
  Administered 2013-03-02: 10 mL
  Filled 2013-03-02: qty 10

## 2013-03-02 MED ORDER — SODIUM CHLORIDE 0.9 % IV SOLN
Freq: Once | INTRAVENOUS | Status: AC
  Administered 2013-03-02: 16:00:00 via INTRAVENOUS

## 2013-03-02 MED ORDER — DIPHENHYDRAMINE HCL 25 MG PO TABS: 12.5000 mg | ORAL_TABLET | Freq: Once | ORAL | Status: DC

## 2013-03-02 MED ORDER — PACLITAXEL CHEMO INJECTION 300 MG/50ML
80.0000 mg/m2 | Freq: Once | INTRAVENOUS | Status: AC
  Administered 2013-03-02: 138 mg via INTRAVENOUS
  Filled 2013-03-02: qty 23

## 2013-03-02 MED ORDER — HEPARIN SOD (PORK) LOCK FLUSH 100 UNIT/ML IV SOLN
500.0000 [IU] | Freq: Once | INTRAVENOUS | Status: AC | PRN
  Administered 2013-03-02: 500 [IU]
  Filled 2013-03-02: qty 5

## 2013-03-02 MED ORDER — DIPHENHYDRAMINE HCL 12.5 MG/5ML PO LIQD
12.5000 mg | Freq: Once | ORAL | Status: AC
  Administered 2013-03-02: 12.5 mg via ORAL
  Filled 2013-03-02: qty 5

## 2013-03-02 MED ORDER — DEXAMETHASONE SODIUM PHOSPHATE 20 MG/5ML IJ SOLN
20.0000 mg | Freq: Once | INTRAMUSCULAR | Status: AC
  Administered 2013-03-02: 20 mg via INTRAVENOUS

## 2013-03-02 NOTE — Patient Instructions (Signed)
[facility] Discharge Instructions for Patients Receiving Chemotherapy  Today you received the following chemotherapy agents: Taxol.  To help prevent nausea and vomiting after your treatment, we encourage you to take your nausea medication as prescribed.   If you develop nausea and vomiting that is not controlled by your nausea medication, call the clinic.   BELOW ARE SYMPTOMS THAT SHOULD BE REPORTED IMMEDIATELY:  *FEVER GREATER THAN 100.5 F  *CHILLS WITH OR WITHOUT FEVER  NAUSEA AND VOMITING THAT IS NOT CONTROLLED WITH YOUR NAUSEA MEDICATION  *UNUSUAL SHORTNESS OF BREATH  *UNUSUAL BRUISING OR BLEEDING  TENDERNESS IN MOUTH AND THROAT WITH OR WITHOUT PRESENCE OF ULCERS  *URINARY PROBLEMS  *BOWEL PROBLEMS  UNUSUAL RASH Items with * indicate a potential emergency and should be followed up as soon as possible.  Feel free to call the clinic you have any questions or concerns. The clinic phone number is [phone].    

## 2013-03-03 ENCOUNTER — Ambulatory Visit (HOSPITAL_BASED_OUTPATIENT_CLINIC_OR_DEPARTMENT_OTHER): Payer: 59

## 2013-03-03 VITALS — BP 103/62 | HR 78 | Temp 98.3°F

## 2013-03-03 DIAGNOSIS — C569 Malignant neoplasm of unspecified ovary: Secondary | ICD-10-CM

## 2013-03-03 DIAGNOSIS — Z5189 Encounter for other specified aftercare: Secondary | ICD-10-CM

## 2013-03-03 MED ORDER — FILGRASTIM 300 MCG/0.5ML IJ SOLN
300.0000 ug | Freq: Once | INTRAMUSCULAR | Status: AC
  Administered 2013-03-03: 300 ug via SUBCUTANEOUS
  Filled 2013-03-03: qty 0.5

## 2013-03-04 ENCOUNTER — Ambulatory Visit (HOSPITAL_BASED_OUTPATIENT_CLINIC_OR_DEPARTMENT_OTHER): Payer: 59

## 2013-03-04 VITALS — BP 99/53 | HR 80 | Temp 98.0°F

## 2013-03-04 DIAGNOSIS — C569 Malignant neoplasm of unspecified ovary: Secondary | ICD-10-CM

## 2013-03-04 DIAGNOSIS — Z5189 Encounter for other specified aftercare: Secondary | ICD-10-CM

## 2013-03-04 MED ORDER — FILGRASTIM 300 MCG/0.5ML IJ SOLN
300.0000 ug | Freq: Once | INTRAMUSCULAR | Status: AC
  Administered 2013-03-04: 300 ug via SUBCUTANEOUS
  Filled 2013-03-04: qty 0.5

## 2013-03-09 ENCOUNTER — Other Ambulatory Visit (HOSPITAL_BASED_OUTPATIENT_CLINIC_OR_DEPARTMENT_OTHER): Payer: 59 | Admitting: Lab

## 2013-03-09 ENCOUNTER — Ambulatory Visit (HOSPITAL_BASED_OUTPATIENT_CLINIC_OR_DEPARTMENT_OTHER): Payer: 59

## 2013-03-09 VITALS — BP 102/70 | HR 91 | Temp 97.5°F | Resp 14

## 2013-03-09 DIAGNOSIS — C561 Malignant neoplasm of right ovary: Secondary | ICD-10-CM

## 2013-03-09 DIAGNOSIS — C569 Malignant neoplasm of unspecified ovary: Secondary | ICD-10-CM

## 2013-03-09 DIAGNOSIS — Z5111 Encounter for antineoplastic chemotherapy: Secondary | ICD-10-CM

## 2013-03-09 LAB — CBC WITH DIFFERENTIAL/PLATELET
BASO%: 0 % (ref 0.0–2.0)
Eosinophils Absolute: 0 10*3/uL (ref 0.0–0.5)
HCT: 30.5 % — ABNORMAL LOW (ref 34.8–46.6)
LYMPH%: 21.5 % (ref 14.0–49.7)
MCHC: 33.1 g/dL (ref 31.5–36.0)
MONO#: 0 10*3/uL — ABNORMAL LOW (ref 0.1–0.9)
NEUT%: 76 % (ref 38.4–76.8)
Platelets: 170 10*3/uL (ref 145–400)
WBC: 1.6 10*3/uL — ABNORMAL LOW (ref 3.9–10.3)

## 2013-03-09 LAB — COMPREHENSIVE METABOLIC PANEL (CC13)
BUN: 16.8 mg/dL (ref 7.0–26.0)
CO2: 23 mEq/L (ref 22–29)
Creatinine: 0.8 mg/dL (ref 0.6–1.1)
Glucose: 152 mg/dl — ABNORMAL HIGH (ref 70–140)
Total Bilirubin: 0.3 mg/dL (ref 0.20–1.20)

## 2013-03-09 LAB — CA 125: CA 125: 40.3 U/mL — ABNORMAL HIGH (ref 0.0–30.2)

## 2013-03-09 MED ORDER — DIPHENHYDRAMINE HCL 12.5 MG/5ML PO ELIX
12.5000 mg | ORAL_SOLUTION | Freq: Once | ORAL | Status: AC
  Administered 2013-03-09: 12.5 mg via ORAL
  Filled 2013-03-09: qty 5

## 2013-03-09 MED ORDER — HEPARIN SOD (PORK) LOCK FLUSH 100 UNIT/ML IV SOLN
500.0000 [IU] | Freq: Once | INTRAVENOUS | Status: AC | PRN
Start: 2013-03-09 — End: 2013-03-09
  Administered 2013-03-09: 500 [IU]
  Filled 2013-03-09: qty 5

## 2013-03-09 MED ORDER — PACLITAXEL CHEMO INJECTION 300 MG/50ML
80.0000 mg/m2 | Freq: Once | INTRAVENOUS | Status: AC
  Administered 2013-03-09: 138 mg via INTRAVENOUS
  Filled 2013-03-09: qty 23

## 2013-03-09 MED ORDER — DIPHENHYDRAMINE HCL 25 MG PO TABS
12.5000 mg | ORAL_TABLET | Freq: Once | ORAL | Status: DC
  Filled 2013-03-09: qty 0.5

## 2013-03-09 MED ORDER — SODIUM CHLORIDE 0.9 % IJ SOLN
10.0000 mL | INTRAMUSCULAR | Status: DC | PRN
  Administered 2013-03-09: 10 mL
  Filled 2013-03-09: qty 10

## 2013-03-09 MED ORDER — FAMOTIDINE IN NACL 20-0.9 MG/50ML-% IV SOLN
20.0000 mg | Freq: Once | INTRAVENOUS | Status: AC
  Administered 2013-03-09: 20 mg via INTRAVENOUS

## 2013-03-09 MED ORDER — PROMETHAZINE HCL 25 MG/ML IJ SOLN
25.0000 mg | Freq: Once | INTRAMUSCULAR | Status: AC
  Administered 2013-03-09: 25 mg via INTRAVENOUS
  Filled 2013-03-09: qty 1

## 2013-03-09 MED ORDER — SODIUM CHLORIDE 0.9 % IV SOLN
Freq: Once | INTRAVENOUS | Status: AC
  Administered 2013-03-09: 15:00:00 via INTRAVENOUS

## 2013-03-09 MED ORDER — LORAZEPAM 1 MG PO TABS
1.0000 mg | ORAL_TABLET | Freq: Once | ORAL | Status: AC | PRN
  Administered 2013-03-09: 1 mg via ORAL

## 2013-03-09 MED ORDER — DEXAMETHASONE SODIUM PHOSPHATE 20 MG/5ML IJ SOLN
20.0000 mg | Freq: Once | INTRAMUSCULAR | Status: AC
  Administered 2013-03-09: 20 mg via INTRAVENOUS

## 2013-03-09 NOTE — Progress Notes (Signed)
OK to tx with wbc 1.6 and ANC 1.2 per Dr Truett Perna

## 2013-03-09 NOTE — Patient Instructions (Addendum)
[facility] Discharge Instructions for Patients Receiving Chemotherapy  Today you received the following chemotherapy agents TAXOL  To help prevent nausea and vomiting after your treatment, we encourage you to take your nausea medication IF NEEDED, USE IMMODIUM WITH DIARRHEA AS NEEDED   If you develop nausea and vomiting that is not controlled by your nausea medication, call the clinic.   BELOW ARE SYMPTOMS THAT SHOULD BE REPORTED IMMEDIATELY:  *FEVER GREATER THAN 100.5 F  *CHILLS WITH OR WITHOUT FEVER  NAUSEA AND VOMITING THAT IS NOT CONTROLLED WITH YOUR NAUSEA MEDICATION  *UNUSUAL SHORTNESS OF BREATH  *UNUSUAL BRUISING OR BLEEDING  TENDERNESS IN MOUTH AND THROAT WITH OR WITHOUT PRESENCE OF ULCERS  *URINARY PROBLEMS  *BOWEL PROBLEMS  UNUSUAL RASH Items with * indicate a potential emergency and should be followed up as soon as possible.  Feel free to call the clinic you have any questions or concerns. The clinic phone number is [phone].

## 2013-03-10 ENCOUNTER — Encounter: Payer: Self-pay | Admitting: Oncology

## 2013-03-10 ENCOUNTER — Telehealth: Payer: Self-pay | Admitting: *Deleted

## 2013-03-10 ENCOUNTER — Ambulatory Visit: Payer: 59 | Attending: Oncology | Admitting: Oncology

## 2013-03-10 ENCOUNTER — Ambulatory Visit (HOSPITAL_BASED_OUTPATIENT_CLINIC_OR_DEPARTMENT_OTHER): Payer: 59

## 2013-03-10 VITALS — Wt 150.6 lb

## 2013-03-10 VITALS — BP 110/60 | HR 77 | Temp 98.2°F

## 2013-03-10 DIAGNOSIS — Z5189 Encounter for other specified aftercare: Secondary | ICD-10-CM

## 2013-03-10 DIAGNOSIS — C561 Malignant neoplasm of right ovary: Secondary | ICD-10-CM

## 2013-03-10 DIAGNOSIS — C569 Malignant neoplasm of unspecified ovary: Secondary | ICD-10-CM

## 2013-03-10 MED ORDER — FILGRASTIM 300 MCG/0.5ML IJ SOLN
300.0000 ug | Freq: Once | INTRAMUSCULAR | Status: AC
  Administered 2013-03-10: 300 ug via SUBCUTANEOUS
  Filled 2013-03-10: qty 0.5

## 2013-03-10 NOTE — Telephone Encounter (Signed)
appts made and printed. Pt is aware that her tx's will be added. i emailed MW to add tx"s...td

## 2013-03-10 NOTE — Telephone Encounter (Signed)
Per staff message and POF I have scheduled appts.  JMW  

## 2013-03-10 NOTE — Progress Notes (Signed)
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 She is requiring 2 days of neupogen after each treatment, despite dose decrease in carboplatin since cycle 2 (no further dose reductions subsequently). She has felt fairly well this week, with only minimal, occasional tingling in fingertips. She has not had significant nausea, and she does not have residual pain from the zoster. She has had no fever or symptoms of infection. Even with ANC 1.2 yesterday she has felt less fatigued this week.    ONCOLOGIC HISTORY Patient presented with RUQ pain in March 2014, with ED evaluation then including abdominal US not remarkable. She was then found to have pelvic mass, with MRI pelvis in [place] system 11-11-12 showing centrally necrotic pelvic mass 11.4 x 9.1 x 7.8 cm. CA 125 by Dr Seymour Bars 11-13-2012 was 226.4. She saw Dr Yolande Jolly in [place] on 11-17-12 and went to surgery by him at [facility] on 11-24-12. Surgery was complete debulking including TAH, BSO, omentectomy, right ureterolysis, bilateral pelvic lymphadenectomy and periaortic lymphadenectomy. There is no mention in the operative note of rupture of the ovarian capsule. 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 Pelvic washings ([id number]) negative. Assuming the ovarian capsule was disrupted after surgery, this would be pT1a pN0 for IA staging. Patient was hospitalized at [facility] x 5 days; per patient and husband, she was transfused 2 units PRBCs at [facility]. Post operative course was unremarkable until she developed acute  herpes zoster left ~ T 11-12 dermatome on 12-12-12, then severe pain left inguinal region and upper lateral left thigh such that start of dose dense adjuvant taxol carboplatin was delayed x2 prior to beginning 01-05-13. She has required neupogen x 1-2 days after each treatment.  Note preoperative CA 125 on 11-13-12 was 226 and was 157 post operatively on 12-08-12; this was down to 29 on 01-19-13 and was 37 on July 27.   Review of systems as above, also: generally has diarrhea on Thursdays after treatments on Tuesdays weekly (however a single imodium will cause several days of constipation, so she prefers not to use anything for that diarrhea). Is drinking fluids. Claritin daily alleviates aches. No bleeding. No LE swelling Remainder of 10 point Review of Systems negative.  Objective:  Vital signs in last 24 hours:  Wt 150 lb 9.6 oz (68.312 kg)  BMI 24.32 kg/m2  LMP 10/25/2012  Alert, oriented and appropriate. Ambulatory without difficulty.  Complete alopecia  HEENT:PERRL, sclerae not icteric. Oral mucosa moist without lesions, posterior pharynx clear. Neck supple.  Lymphatics no cervical,suraclavicular, axillary or inguinal adenopathy Resp: clear to auscultation bilaterally and normal percussion bilaterally Cardio: regular rate and rhythm. No gallop. GI: soft, nontender, not distended, no mass or organomegaly. Surgical incision not remarkable. Extremities: without pitting edema, cords, tenderness Neuro: no peripheral neuropathy. Otherwise nonfocal Skin 2 cm ecchymosis left shin otherwise no rash, ecchymosis, petechiae  Axillae benign. Portacath-without erythema or tenderness  Lab Results:  Results for orders placed in visit on 03/09/13  CBC WITH DIFFERENTIAL      Result Value Range   WBC 1.6 (*) 3.9 - 10.3 10e3/uL  NEUT# 1.2 (*) 1.5 - 6.5 10e3/uL   HGB 10.1 (*) 11.6 - 15.9 g/dL   HCT 40.1 (*) 02.7 - 25.3 %   Platelets 170  145 - 400 10e3/uL   MCV 96.5  79.5 - 101.0 fL   MCH 32.0   25.1 - 34.0 pg   MCHC 33.1  31.5 - 36.0 g/dL   RBC 6.64 (*) 4.03 - 4.74 10e6/uL   RDW 15.5 (*) 11.2 - 14.5 %   lymph# 0.3 (*) 0.9 - 3.3 10e3/uL   MONO# 0.0 (*) 0.1 - 0.9 10e3/uL   Eosinophils Absolute 0.0  0.0 - 0.5 10e3/uL   Basophils Absolute 0.0  0.0 - 0.1 10e3/uL   NEUT% 76.0  38.4 - 76.8 %   LYMPH% 21.5  14.0 - 49.7 %   MONO% 2.5  0.0 - 14.0 %   EOS% 0.0  0.0 - 7.0 %   BASO% 0.0  0.0 - 2.0 %  COMPREHENSIVE METABOLIC PANEL (CC13)      Result Value Range   Sodium 142  136 - 145 mEq/L   Potassium 4.3  3.5 - 5.1 mEq/L   Chloride 110 (*) 98 - 109 mEq/L   CO2 23  22 - 29 mEq/L   Glucose 152 (*) 70 - 140 mg/dl   BUN 25.9  7.0 - 56.3 mg/dL   Creatinine 0.8  0.6 - 1.1 mg/dL   Total Bilirubin 8.75  0.20 - 1.20 mg/dL   Alkaline Phosphatase 64  40 - 150 U/L   AST 16  5 - 34 U/L   ALT 15  0 - 55 U/L   Total Protein 7.3  6.4 - 8.3 g/dL   Albumin 4.0  3.5 - 5.0 g/dL   Calcium 9.8  8.4 - 64.3 mg/dL  CA 329      Result Value Range   CA 125 40.3 (*) 0.0 - 30.2 U/mL     Studies/Results:  No results found.  Medications: I have reviewed the patient's current medications.  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 Phenergan suppositories if nausea prevents use of oral phenergan (or IVF with IV antiemetics if needed). Follow CA 125, slight increase in last few weeks unclear significance.  2.acute herpes zoster: resolved. Will use prophylactic acyclovir for duration of chemo due to premedication steroids.  3. Left groin and left lateral thigh pain: likely also related to the zoster, resolved.   6.hypothyroid on replacement  3.lactose and gluten intolerance  4.PAC in, placed at [facility].  5.oral candida  previously, none now  6.intolerance to zofran with HA, severe restless legs with benadryl, intolerance to compazine, dislikes ativan. Using primarily phenergan for nausea.  I will see her again in ~ 2 weeks; she sees Dr Yolande Jolly on 04-09-13. She will have repeat labs including ca 125 from [facility] on 03-30-13.      Macayla Ekdahl P, MD   03/10/2013, 1:48 PM

## 2013-03-11 ENCOUNTER — Other Ambulatory Visit: Payer: Self-pay | Admitting: Certified Registered Nurse Anesthetist

## 2013-03-11 ENCOUNTER — Ambulatory Visit (HOSPITAL_BASED_OUTPATIENT_CLINIC_OR_DEPARTMENT_OTHER): Payer: 59

## 2013-03-11 VITALS — BP 96/50 | HR 77 | Temp 98.3°F

## 2013-03-11 DIAGNOSIS — Z5189 Encounter for other specified aftercare: Secondary | ICD-10-CM

## 2013-03-11 DIAGNOSIS — C569 Malignant neoplasm of unspecified ovary: Secondary | ICD-10-CM

## 2013-03-11 MED ORDER — FILGRASTIM 300 MCG/0.5ML IJ SOLN
300.0000 ug | Freq: Once | INTRAMUSCULAR | Status: AC
  Administered 2013-03-11: 300 ug via SUBCUTANEOUS
  Filled 2013-03-11: qty 0.5

## 2013-03-16 ENCOUNTER — Telehealth: Payer: Self-pay | Admitting: *Deleted

## 2013-03-16 ENCOUNTER — Other Ambulatory Visit: Payer: 59 | Admitting: Lab

## 2013-03-16 ENCOUNTER — Other Ambulatory Visit: Payer: Self-pay | Admitting: Oncology

## 2013-03-16 ENCOUNTER — Ambulatory Visit: Payer: 59 | Admitting: Physician Assistant

## 2013-03-16 ENCOUNTER — Other Ambulatory Visit (HOSPITAL_BASED_OUTPATIENT_CLINIC_OR_DEPARTMENT_OTHER): Payer: 59 | Admitting: Lab

## 2013-03-16 ENCOUNTER — Ambulatory Visit (HOSPITAL_BASED_OUTPATIENT_CLINIC_OR_DEPARTMENT_OTHER): Payer: 59

## 2013-03-16 VITALS — BP 99/66 | HR 83 | Temp 97.8°F

## 2013-03-16 DIAGNOSIS — C569 Malignant neoplasm of unspecified ovary: Secondary | ICD-10-CM

## 2013-03-16 DIAGNOSIS — C561 Malignant neoplasm of right ovary: Secondary | ICD-10-CM

## 2013-03-16 DIAGNOSIS — Z5111 Encounter for antineoplastic chemotherapy: Secondary | ICD-10-CM

## 2013-03-16 LAB — CBC WITH DIFFERENTIAL/PLATELET
BASO%: 0 % (ref 0.0–2.0)
Basophils Absolute: 0 10*3/uL (ref 0.0–0.1)
EOS%: 0.8 % (ref 0.0–7.0)
HCT: 31.4 % — ABNORMAL LOW (ref 34.8–46.6)
HGB: 10.3 g/dL — ABNORMAL LOW (ref 11.6–15.9)
LYMPH%: 27.9 % (ref 14.0–49.7)
MCH: 32.8 pg (ref 25.1–34.0)
MCHC: 32.8 g/dL (ref 31.5–36.0)
MCV: 100 fL (ref 79.5–101.0)
MONO%: 21.7 % — ABNORMAL HIGH (ref 0.0–14.0)
NEUT%: 49.6 % (ref 38.4–76.8)

## 2013-03-16 MED ORDER — PROMETHAZINE HCL 25 MG/ML IJ SOLN
12.5000 mg | Freq: Once | INTRAMUSCULAR | Status: AC
  Administered 2013-03-16: 12.5 mg via INTRAVENOUS
  Filled 2013-03-16: qty 1

## 2013-03-16 MED ORDER — SODIUM CHLORIDE 0.9 % IJ SOLN
10.0000 mL | INTRAMUSCULAR | Status: DC | PRN
  Administered 2013-03-16: 10 mL
  Filled 2013-03-16: qty 10

## 2013-03-16 MED ORDER — SODIUM CHLORIDE 0.9 % IV SOLN
351.0000 mg | Freq: Once | INTRAVENOUS | Status: AC
  Administered 2013-03-16: 350 mg via INTRAVENOUS
  Filled 2013-03-16: qty 35

## 2013-03-16 MED ORDER — DIPHENHYDRAMINE HCL 25 MG PO TABS: 12.5000 mg | ORAL_TABLET | Freq: Once | ORAL | Status: DC

## 2013-03-16 MED ORDER — FAMOTIDINE IN NACL 20-0.9 MG/50ML-% IV SOLN
20.0000 mg | Freq: Once | INTRAVENOUS | Status: AC
  Administered 2013-03-16: 20 mg via INTRAVENOUS

## 2013-03-16 MED ORDER — HEPARIN SOD (PORK) LOCK FLUSH 100 UNIT/ML IV SOLN
500.0000 [IU] | Freq: Once | INTRAVENOUS | Status: AC | PRN
  Administered 2013-03-16: 500 [IU]
  Filled 2013-03-16: qty 5

## 2013-03-16 MED ORDER — DIPHENHYDRAMINE HCL 12.5 MG/5ML PO ELIX
12.5000 mg | ORAL_SOLUTION | Freq: Once | ORAL | Status: AC
  Administered 2013-03-16: 12.5 mg via ORAL
  Filled 2013-03-16: qty 5

## 2013-03-16 MED ORDER — DEXTROSE 5 % IV SOLN
80.0000 mg/m2 | Freq: Once | INTRAVENOUS | Status: AC
  Administered 2013-03-16: 138 mg via INTRAVENOUS
  Filled 2013-03-16: qty 23

## 2013-03-16 MED ORDER — LORAZEPAM 1 MG PO TABS
1.0000 mg | ORAL_TABLET | Freq: Once | ORAL | Status: AC | PRN
  Administered 2013-03-16: 1 mg via ORAL

## 2013-03-16 MED ORDER — DEXAMETHASONE SODIUM PHOSPHATE 20 MG/5ML IJ SOLN
20.0000 mg | Freq: Once | INTRAMUSCULAR | Status: AC
  Administered 2013-03-16: 20 mg via INTRAVENOUS

## 2013-03-16 MED ORDER — SODIUM CHLORIDE 0.9 % IV SOLN
Freq: Once | INTRAVENOUS | Status: AC
  Administered 2013-03-16: 15:00:00 via INTRAVENOUS

## 2013-03-16 NOTE — Progress Notes (Signed)
Medical Oncology  ANC 1.2 day 1 cycle 4 carbo taxol today. Carboplatin dose decreased to AUC = 3 for this cycle only. Nursing instructed to give chemotherapy today with dose change. Ila Mcgill, MD

## 2013-03-16 NOTE — Patient Instructions (Addendum)
[facility] Discharge Instructions for Patients Receiving Chemotherapy  Today you received the following chemotherapy agents Taxol/Carboplatin To help prevent nausea and vomiting after your treatment, we encourage you to take your nausea medication as prescribed.  If you develop nausea and vomiting that is not controlled by your nausea medication, call the clinic.   BELOW ARE SYMPTOMS THAT SHOULD BE REPORTED IMMEDIATELY:  *FEVER GREATER THAN 100.5 F  *CHILLS WITH OR WITHOUT FEVER  NAUSEA AND VOMITING THAT IS NOT CONTROLLED WITH YOUR NAUSEA MEDICATION  *UNUSUAL SHORTNESS OF BREATH  *UNUSUAL BRUISING OR BLEEDING  TENDERNESS IN MOUTH AND THROAT WITH OR WITHOUT PRESENCE OF ULCERS  *URINARY PROBLEMS  *BOWEL PROBLEMS  UNUSUAL RASH Items with * indicate a potential emergency and should be followed up as soon as possible.  Feel free to call the clinic you have any questions or concerns. The clinic phone number is [phone].    

## 2013-03-16 NOTE — Progress Notes (Signed)
Per Dr Darrold Span, pt needs 2 days of neupogen injections and will dose reduce the carboplatin due to low counts today.

## 2013-03-16 NOTE — Progress Notes (Signed)
Ok to treat per Dr. Darrold Span with today CBC.

## 2013-03-16 NOTE — Telephone Encounter (Signed)
Patient called reporting she forgot to take the steroids 12 hours before today's scheduled treatment.  Instructed to come in that we will give steroids before treatment.  Notified pharmacist and treatment nurse.

## 2013-03-17 ENCOUNTER — Telehealth: Payer: Self-pay | Admitting: *Deleted

## 2013-03-17 ENCOUNTER — Ambulatory Visit (HOSPITAL_BASED_OUTPATIENT_CLINIC_OR_DEPARTMENT_OTHER): Payer: 59

## 2013-03-17 VITALS — BP 107/63 | HR 79 | Temp 98.2°F

## 2013-03-17 DIAGNOSIS — C569 Malignant neoplasm of unspecified ovary: Secondary | ICD-10-CM

## 2013-03-17 DIAGNOSIS — Z5189 Encounter for other specified aftercare: Secondary | ICD-10-CM

## 2013-03-17 MED ORDER — FILGRASTIM 300 MCG/0.5ML IJ SOLN
300.0000 ug | Freq: Once | INTRAMUSCULAR | Status: AC
  Administered 2013-03-17: 300 ug via SUBCUTANEOUS
  Filled 2013-03-17: qty 0.5

## 2013-03-17 NOTE — Telephone Encounter (Signed)
Per staff message and POF I have scheduled appts.  JMW  

## 2013-03-18 ENCOUNTER — Ambulatory Visit (HOSPITAL_BASED_OUTPATIENT_CLINIC_OR_DEPARTMENT_OTHER): Payer: 59

## 2013-03-18 VITALS — BP 98/55 | HR 82 | Temp 98.1°F

## 2013-03-18 DIAGNOSIS — C569 Malignant neoplasm of unspecified ovary: Secondary | ICD-10-CM

## 2013-03-18 DIAGNOSIS — Z5189 Encounter for other specified aftercare: Secondary | ICD-10-CM

## 2013-03-18 MED ORDER — FILGRASTIM 300 MCG/0.5ML IJ SOLN
300.0000 ug | Freq: Once | INTRAMUSCULAR | Status: AC
  Administered 2013-03-18: 300 ug via SUBCUTANEOUS
  Filled 2013-03-18: qty 0.5

## 2013-03-21 ENCOUNTER — Other Ambulatory Visit: Payer: Self-pay | Admitting: Oncology

## 2013-03-23 ENCOUNTER — Other Ambulatory Visit (HOSPITAL_BASED_OUTPATIENT_CLINIC_OR_DEPARTMENT_OTHER): Payer: 59 | Admitting: Lab

## 2013-03-23 ENCOUNTER — Ambulatory Visit (HOSPITAL_BASED_OUTPATIENT_CLINIC_OR_DEPARTMENT_OTHER): Payer: 59

## 2013-03-23 VITALS — BP 106/53 | HR 88 | Temp 97.8°F

## 2013-03-23 DIAGNOSIS — C569 Malignant neoplasm of unspecified ovary: Secondary | ICD-10-CM

## 2013-03-23 DIAGNOSIS — C561 Malignant neoplasm of right ovary: Secondary | ICD-10-CM

## 2013-03-23 DIAGNOSIS — Z5111 Encounter for antineoplastic chemotherapy: Secondary | ICD-10-CM

## 2013-03-23 LAB — CBC WITH DIFFERENTIAL/PLATELET
Basophils Absolute: 0 10*3/uL (ref 0.0–0.1)
EOS%: 0 % (ref 0.0–7.0)
Eosinophils Absolute: 0 10*3/uL (ref 0.0–0.5)
HGB: 10.6 g/dL — ABNORMAL LOW (ref 11.6–15.9)
NEUT#: 2.3 10*3/uL (ref 1.5–6.5)
RDW: 17.3 % — ABNORMAL HIGH (ref 11.2–14.5)
WBC: 2.9 10*3/uL — ABNORMAL LOW (ref 3.9–10.3)
lymph#: 0.5 10*3/uL — ABNORMAL LOW (ref 0.9–3.3)

## 2013-03-23 MED ORDER — PROMETHAZINE HCL 25 MG/ML IJ SOLN
25.0000 mg | Freq: Once | INTRAMUSCULAR | Status: AC
  Administered 2013-03-23: 25 mg via INTRAVENOUS
  Filled 2013-03-23: qty 1

## 2013-03-23 MED ORDER — DIPHENHYDRAMINE HCL 50 MG/ML IJ SOLN
INTRAMUSCULAR | Status: AC
  Filled 2013-03-23: qty 1

## 2013-03-23 MED ORDER — DEXTROSE 5 % IV SOLN
80.0000 mg/m2 | Freq: Once | INTRAVENOUS | Status: AC
  Administered 2013-03-23: 138 mg via INTRAVENOUS
  Filled 2013-03-23: qty 23

## 2013-03-23 MED ORDER — DEXAMETHASONE SODIUM PHOSPHATE 20 MG/5ML IJ SOLN
INTRAMUSCULAR | Status: AC
  Filled 2013-03-23: qty 5

## 2013-03-23 MED ORDER — SODIUM CHLORIDE 0.9 % IJ SOLN
10.0000 mL | INTRAMUSCULAR | Status: DC | PRN
  Administered 2013-03-23: 10 mL
  Filled 2013-03-23: qty 10

## 2013-03-23 MED ORDER — FAMOTIDINE IN NACL 20-0.9 MG/50ML-% IV SOLN
20.0000 mg | Freq: Once | INTRAVENOUS | Status: AC
  Administered 2013-03-23: 20 mg via INTRAVENOUS

## 2013-03-23 MED ORDER — LORAZEPAM 1 MG PO TABS
ORAL_TABLET | ORAL | Status: AC
  Filled 2013-03-23: qty 1

## 2013-03-23 MED ORDER — FAMOTIDINE IN NACL 20-0.9 MG/50ML-% IV SOLN
INTRAVENOUS | Status: AC
  Filled 2013-03-23: qty 50

## 2013-03-23 MED ORDER — DEXAMETHASONE SODIUM PHOSPHATE 20 MG/5ML IJ SOLN
20.0000 mg | Freq: Once | INTRAMUSCULAR | Status: AC
  Administered 2013-03-23: 20 mg via INTRAVENOUS

## 2013-03-23 MED ORDER — DIPHENHYDRAMINE HCL 25 MG PO TABS
12.5000 mg | ORAL_TABLET | Freq: Once | ORAL | Status: DC
  Filled 2013-03-23: qty 0.5

## 2013-03-23 MED ORDER — LORAZEPAM 1 MG PO TABS
1.0000 mg | ORAL_TABLET | Freq: Once | ORAL | Status: AC | PRN
  Administered 2013-03-23: 1 mg via ORAL

## 2013-03-23 MED ORDER — HEPARIN SOD (PORK) LOCK FLUSH 100 UNIT/ML IV SOLN
500.0000 [IU] | Freq: Once | INTRAVENOUS | Status: AC | PRN
Start: 2013-03-23 — End: 2013-03-23
  Administered 2013-03-23: 500 [IU]
  Filled 2013-03-23: qty 5

## 2013-03-23 MED ORDER — SODIUM CHLORIDE 0.9 % IV SOLN
Freq: Once | INTRAVENOUS | Status: AC
  Administered 2013-03-23: 15:00:00 via INTRAVENOUS

## 2013-03-23 MED ORDER — DIPHENHYDRAMINE HCL 12.5 MG/5ML PO ELIX
12.5000 mg | ORAL_SOLUTION | Freq: Once | ORAL | Status: AC
  Administered 2013-03-23: 12.5 mg via ORAL
  Filled 2013-03-23: qty 5

## 2013-03-23 NOTE — Patient Instructions (Addendum)
[facility] Discharge Instructions for Patients Receiving Chemotherapy  Today you received the following chemotherapy agents: taxol  To help prevent nausea and vomiting after your treatment, we encourage you to take your nausea medication.  Take it as often as prescribed.     If you develop nausea and vomiting that is not controlled by your nausea medication, call the clinic. If it is after clinic hours your family physician or the after hours number for the clinic or go to the Emergency Department.   BELOW ARE SYMPTOMS THAT SHOULD BE REPORTED IMMEDIATELY:  *FEVER GREATER THAN 100.5 F  *CHILLS WITH OR WITHOUT FEVER  NAUSEA AND VOMITING THAT IS NOT CONTROLLED WITH YOUR NAUSEA MEDICATION  *UNUSUAL SHORTNESS OF BREATH  *UNUSUAL BRUISING OR BLEEDING  TENDERNESS IN MOUTH AND THROAT WITH OR WITHOUT PRESENCE OF ULCERS  *URINARY PROBLEMS  *BOWEL PROBLEMS  UNUSUAL RASH Items with * indicate a potential emergency and should be followed up as soon as possible.  Feel free to call the clinic you have any questions or concerns. The clinic phone number is [phone].   I have been informed and understand all the instructions given to me. I know to contact the clinic, my physician, or go to the Emergency Department if any problems should occur. I do not have any questions at this time, but understand that I may call the clinic during office hours   should I have any questions or need assistance in obtaining follow up care.    __________________________________________  _____________  __________ Signature of Patient or Authorized Representative            Date                   Time    __________________________________________ Nurse's Signature    

## 2013-03-24 ENCOUNTER — Telehealth: Payer: Self-pay | Admitting: *Deleted

## 2013-03-24 ENCOUNTER — Ambulatory Visit (HOSPITAL_BASED_OUTPATIENT_CLINIC_OR_DEPARTMENT_OTHER): Payer: 59

## 2013-03-24 ENCOUNTER — Ambulatory Visit: Payer: 59

## 2013-03-24 ENCOUNTER — Telehealth: Payer: Self-pay | Admitting: Oncology

## 2013-03-24 ENCOUNTER — Ambulatory Visit (HOSPITAL_BASED_OUTPATIENT_CLINIC_OR_DEPARTMENT_OTHER): Payer: 59 | Admitting: Oncology

## 2013-03-24 ENCOUNTER — Ambulatory Visit: Payer: 59 | Admitting: Oncology

## 2013-03-24 VITALS — BP 97/64 | HR 78 | Temp 97.7°F | Resp 20 | Ht 66.0 in | Wt 153.3 lb

## 2013-03-24 DIAGNOSIS — C561 Malignant neoplasm of right ovary: Secondary | ICD-10-CM

## 2013-03-24 DIAGNOSIS — E039 Hypothyroidism, unspecified: Secondary | ICD-10-CM

## 2013-03-24 DIAGNOSIS — Z5189 Encounter for other specified aftercare: Secondary | ICD-10-CM

## 2013-03-24 DIAGNOSIS — C569 Malignant neoplasm of unspecified ovary: Secondary | ICD-10-CM

## 2013-03-24 MED ORDER — FILGRASTIM 300 MCG/0.5ML IJ SOLN
300.0000 ug | Freq: Once | INTRAMUSCULAR | Status: AC
  Administered 2013-03-24: 300 ug via SUBCUTANEOUS
  Filled 2013-03-24: qty 0.5

## 2013-03-24 NOTE — Patient Instructions (Signed)
Keep apt Dr Yolande Jolly 9-26 as planned

## 2013-03-24 NOTE — Telephone Encounter (Signed)
, °

## 2013-03-24 NOTE — Progress Notes (Signed)
OFFICE PROGRESS NOTE   03/24/2013   Physicians:Daniel ClarkePearson; Maurice Small, MD, Ronnald Ramp, Rodolph Bong   INTERVAL HISTORY:  Patient is seen, alone for visit, in continuing attention to adjuvant dose dense taxol carboplatin for IA FIGO 3 endometrial carcinoma, due day 15 cycle 4 on 03-30-13.She has had delays for low WBC despite 2 doses of neupogen after each chemotherapy and dose reduction of carboplatin cycles 2 and 3 to AUC=5. Cycle 4 carboplatin was further reduced to AUC=4 as ANC was 1.2 day of that treatment (03-16-13). She has PAC. Patient has been fatigued, but is working 40 hours/week. She has had no fever or symptoms of infection, no bleeding, no increased N/V and no significant peripheral neuropathy.    ONCOLOGIC HISTORY Patient presented with RUQ pain in March 2014, with ED evaluation then including abdominal US not remarkable. She was then found to have pelvic mass, with MRI pelvis in [facility] system 11-11-12 showing centrally necrotic pelvic mass 11.4 x 9.1 x 7.8 cm. CA 125 by Dr Seymour Bars 11-13-2012 was 226.4. She saw Dr Yolande Jolly in [place] on 11-17-12 and went to surgery by him at [facility] on 11-24-12. Surgery was complete debulking including TAH, BSO, omentectomy, right ureterolysis, bilateral pelvic lymphadenectomy and periaortic lymphadenectomy. There is no mention in the operative note of rupture of the ovarian capsule. 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 Pelvic washings ([id number]) negative. Assuming the ovarian capsule was disrupted after surgery, this would be pT1a pN0 for IA staging. Patient was hospitalized at [facility] x 5 days; per patient and husband, she was transfused 2 units PRBCs at [facility]. Post operative course was unremarkable until she developed acute herpes zoster left ~ T 11-12 dermatome on  12-12-12, then severe pain left inguinal region and upper lateral left thigh such that start of dose dense adjuvant taxol carboplatin was delayed x2 prior to beginning 01-05-13. She has required neupogen x 1-2 days after each treatment.  Note preoperative CA 125 on 11-13-12 was 226 and was 157 post operatively on 12-08-12; this was down to 29 on 01-19-13 and was 37 on July 27.   Review of systems as above, also: No discomfort now from previous zoster, but notices LLE "not as strong" since the zoster, as with climbing stairs. Bowels moving adequately. Taste disturbance from chemo, but is eating. Remainder of 10 point Review of Systems negative.  Objective:  Vital signs in last 24 hours:  BP 97/64  Pulse 78  Temp(Src) 97.7 F (36.5 C) (Oral)  Resp 20  Ht 5\' 6"  (1.676 m)  Wt 153 lb 4.8 oz (69.536 kg)  BMI 24.75 kg/m2  LMP 10/25/2012 Weight is up 3 lbs from last visit. Alert, oriented and appropriate. Ambulatory without difficulty and easily able to get on and off exam table.  Total alopecia  HEENT:PERRL, sclerae not icteric. Oral mucosa moist without lesions, posterior pharynx clear. Mucous membranes slightly pale.Neck supple. No JVD. Lymphatics cervical,suraclavicular, axillary or inguinal adenopathy Resp: clear to auscultation bilaterally and normal percussion bilaterally Cardio: regular rate and rhythm. No gallop. GI: soft, nontender, not distended, no mass or organomegaly. Surgical incision not remarkable. Extremities: without pitting edema, cords, tenderness Neuro: no peripheral neuropathy. Otherwise nonfocal Skin without rash, ecchymosis, petechiae other than residual discoloration from zoster left buttock Portacath-without erythema or tenderness  Lab Results:  Results for orders placed in visit on 03/23/13  CBC WITH DIFFERENTIAL      Result Value Range   WBC 2.9 (*) 3.9 - 10.3 10e3/uL   NEUT# 2.3  1.5 - 6.5 10e3/uL   HGB 10.6 (*) 11.6 - 15.9 g/dL   HCT 44.0 (*) 10.2 - 72.5 %    Platelets 246  145 - 400 10e3/uL   MCV 98.9  79.5 - 101.0 fL   MCH 34.3 (*) 25.1 - 34.0 pg   MCHC 34.7  31.5 - 36.0 g/dL   RBC 3.66 (*) 4.40 - 3.47 10e6/uL   RDW 17.3 (*) 11.2 - 14.5 %   lymph# 0.5 (*) 0.9 - 3.3 10e3/uL   MONO# 0.1  0.1 - 0.9 10e3/uL   Eosinophils Absolute 0.0  0.0 - 0.5 10e3/uL   Basophils Absolute 0.0  0.0 - 0.1 10e3/uL   NEUT% 80.2 (*) 38.4 - 76.8 %   LYMPH% 16.3  14.0 - 49.7 %   MONO% 3.3  0.0 - 14.0 %   EOS% 0.0  0.0 - 7.0 %   BASO% 0.2  0.0 - 2.0 %   Last CA 125 from 02-09-13    37.7.  Studies/Results:  No results found.  Medications: I have reviewed the patient's current medications.She has been using decadron 20 mg 12 hrs prior to taxol, which requires her to take dose ~ 0200. She would like to take the dose instead ~ 0600, which will be ~ 8 hrs prior to treatment, which seems very reasonable.  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 She is requiring 2 days of neupogen after each treatment, despite dose decrease in carboplatin since cycle 2 (no further dose reductions subsequently). She has felt fairly well this week, with only minimal, occasional tingling in fingertips. She has not had significant nausea, and she does not have residual pain from the zoster. She has had no fever or symptoms of infection. Even with ANC 1.2 yesterday she has felt less fatigued this week.  ONCOLOGIC HISTORY  Patient presented with RUQ pain in March 2014, with ED evaluation then including abdominal US not remarkable. She was then found to have pelvic mass, with MRI pelvis in [facility] system 11-11-12 showing centrally necrotic pelvic mass 11.4 x 9.1 x 7.8 cm. CA 125 by Dr Seymour Bars  11-13-2012 was 226.4. She saw Dr Yolande Jolly in [place] on 11-17-12 and went to surgery by him at [facility] on 11-24-12. Surgery was complete debulking including TAH, BSO, omentectomy, right ureterolysis, bilateral pelvic lymphadenectomy and periaortic lymphadenectomy. There is no mention in the operative note of rupture of the ovarian capsule. 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 Pelvic washings ([id number]) negative. Assuming the ovarian capsule was disrupted after surgery, this would be pT1a pN0 for IA staging. Patient was hospitalized at [facility] x 5 days; per patient and husband, she was transfused 2 units PRBCs at [facility]. Post operative course was unremarkable until she developed acute herpes zoster left ~ T 11-12 dermatome on 12-12-12, then severe pain left inguinal region and upper lateral left thigh such that start of dose dense adjuvant taxol carboplatin was delayed  x2 prior to beginning 01-05-13. She has required neupogen x 1-2 days after each treatment.  Note preoperative CA 125 on 11-13-12 was 226 and was 157 post operatively on 12-08-12; this was down to 29 on 01-19-13 and was 37 on July 27.  Review of systems as above, also: generally has diarrhea on Thursdays after treatments on Tuesdays weekly (however a single imodium will cause several days of constipation, so she prefers not to use anything for that diarrhea). Is drinking fluids. Claritin daily alleviates aches.  No bleeding. No LE swelling  Remainder of 10 point Review of Systems negative.  Objective:  Vital signs in last 24 hours:  Wt 150 lb 9.6 oz (68.312 kg)  BMI 24.32 kg/m2  LMP 10/25/2012  Alert, oriented and appropriate. Ambulatory without difficulty.  Complete alopecia  HEENT:PERRL, sclerae not icteric. Oral mucosa moist without lesions, posterior pharynx clear. Neck  supple.  Lymphatics no cervical,suraclavicular, axillary or inguinal adenopathy  Resp: clear to auscultation bilaterally and normal percussion bilaterally  Cardio: regular rate and rhythm. No gallop.  GI: soft, nontender, not distended, no mass or organomegaly. Surgical incision not remarkable.  Extremities: without pitting edema, cords, tenderness  Neuro: no peripheral neuropathy. Otherwise nonfocal  Skin 2 cm ecchymosis left shin otherwise no rash, ecchymosis, petechiae  Axillae benign.  Portacath-without erythema or tenderness  Lab Results:     Results for orders placed in visit on 03/09/13     CBC WITH DIFFERENTIAL     Result  Value  Range      WBC  1.6 (*)  3.9 - 10.3 10e3/uL      NEUT#  1.2 (*)  1.5 - 6.5 10e3/uL      HGB  10.1 (*)  11.6 - 15.9 g/dL      HCT  16.1 (*)  09.6 - 46.6 %      Platelets  170  145 - 400 10e3/uL      MCV  96.5  79.5 - 101.0 fL      MCH  32.0  25.1 - 34.0 pg      MCHC  33.1  31.5 - 36.0 g/dL      RBC  0.45 (*)  4.09 - 5.45 10e6/uL      RDW  15.5 (*)  11.2 - 14.5 %      lymph#  0.3 (*)  0.9 - 3.3 10e3/uL      MONO#  0.0 (*)  0.1 - 0.9 10e3/uL      Eosinophils Absolute  0.0  0.0 - 0.5 10e3/uL      Basophils Absolute  0.0  0.0 - 0.1 10e3/uL      NEUT%  76.0  38.4 - 76.8 %      LYMPH%  21.5  14.0 - 49.7 %      MONO%  2.5  0.0 - 14.0 %      EOS%  0.0  0.0 - 7.0 %      BASO%  0.0  0.0 - 2.0 %     COMPREHENSIVE METABOLIC PANEL (CC13)     Result  Value  Range      Sodium  142  136 - 145 mEq/L      Potassium  4.3  3.5 - 5.1 mEq/L      Chloride  110 (*)  98 - 109 mEq/L      CO2  23  22 - 29 mEq/L      Glucose  152 (*)  70 - 140 mg/dl  BUN  16.8  7.0 - 26.0 mg/dL      Creatinine  0.8  0.6 - 1.1 mg/dL      Total Bilirubin  1.61  0.20 - 1.20 mg/dL      Alkaline Phosphatase  64  40 - 150 U/L      AST  16  5 - 34 U/L      ALT  15  0 - 55 U/L      Total Protein  7.3  6.4 - 8.3 g/dL      Albumin  4.0  3.5 - 5.0 g/dL      Calcium  9.8  8.4 - 10.4 mg/dL      CA 096     Result  Value  Range      CA 125  40.3 (*)  0.0 - 30.2 U/mL     Studies/Results:  No results found.  Medications: I have reviewed the patient's current medications.  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 I will try to increase carboplatin AUC back to 5 if counts allow. She is to see Dr Yolande Jolly again 04-09-13.  2.continuing prophylactic acyclovir for duration of chemo due to premedication steroids and previous acute zoster 3. Left LE pain: likely also related to the zoster, resolved.  6.hypothyroid on replacement  3.lactose and gluten intolerance  4.PAC in, placed at [facility].  5.oral candida previously  6.intolerance to zofran with HA, severe restless legs with benadryl, intolerance to compazine, dislikes ativan. Using primarily phenergan for nausea.          LIVESAY,LENNIS P, MD   03/24/2013, 3:11 PM

## 2013-03-24 NOTE — Telephone Encounter (Signed)
Per staff message and POF I have scheduled appts.  JMW  

## 2013-03-25 ENCOUNTER — Ambulatory Visit (HOSPITAL_BASED_OUTPATIENT_CLINIC_OR_DEPARTMENT_OTHER): Payer: 59

## 2013-03-25 ENCOUNTER — Encounter: Payer: Self-pay | Admitting: Oncology

## 2013-03-25 VITALS — BP 104/55 | HR 88 | Temp 97.4°F

## 2013-03-25 DIAGNOSIS — C569 Malignant neoplasm of unspecified ovary: Secondary | ICD-10-CM

## 2013-03-25 DIAGNOSIS — Z5189 Encounter for other specified aftercare: Secondary | ICD-10-CM

## 2013-03-25 MED ORDER — FILGRASTIM 300 MCG/0.5ML IJ SOLN
300.0000 ug | Freq: Once | INTRAMUSCULAR | Status: AC
  Administered 2013-03-25: 300 ug via SUBCUTANEOUS
  Filled 2013-03-25: qty 0.5

## 2013-03-26 ENCOUNTER — Ambulatory Visit: Payer: 59 | Admitting: Oncology

## 2013-03-28 ENCOUNTER — Other Ambulatory Visit: Payer: Self-pay | Admitting: Oncology

## 2013-03-28 DIAGNOSIS — C561 Malignant neoplasm of right ovary: Secondary | ICD-10-CM

## 2013-03-30 ENCOUNTER — Ambulatory Visit (HOSPITAL_BASED_OUTPATIENT_CLINIC_OR_DEPARTMENT_OTHER): Payer: 59

## 2013-03-30 ENCOUNTER — Other Ambulatory Visit (HOSPITAL_BASED_OUTPATIENT_CLINIC_OR_DEPARTMENT_OTHER): Payer: 59 | Admitting: Lab

## 2013-03-30 ENCOUNTER — Ambulatory Visit: Payer: 59

## 2013-03-30 VITALS — BP 106/71 | HR 96 | Temp 97.5°F | Resp 20

## 2013-03-30 DIAGNOSIS — C561 Malignant neoplasm of right ovary: Secondary | ICD-10-CM

## 2013-03-30 DIAGNOSIS — Z5111 Encounter for antineoplastic chemotherapy: Secondary | ICD-10-CM

## 2013-03-30 DIAGNOSIS — C569 Malignant neoplasm of unspecified ovary: Secondary | ICD-10-CM

## 2013-03-30 LAB — CBC WITH DIFFERENTIAL/PLATELET
Basophils Absolute: 0 10*3/uL (ref 0.0–0.1)
Eosinophils Absolute: 0 10*3/uL (ref 0.0–0.5)
HCT: 32.6 % — ABNORMAL LOW (ref 34.8–46.6)
HGB: 10.6 g/dL — ABNORMAL LOW (ref 11.6–15.9)
LYMPH%: 13.8 % — ABNORMAL LOW (ref 14.0–49.7)
MCV: 101.9 fL — ABNORMAL HIGH (ref 79.5–101.0)
MONO%: 3.1 % (ref 0.0–14.0)
NEUT#: 3.2 10*3/uL (ref 1.5–6.5)
NEUT%: 82.8 % — ABNORMAL HIGH (ref 38.4–76.8)
Platelets: 228 10*3/uL (ref 145–400)

## 2013-03-30 LAB — COMPREHENSIVE METABOLIC PANEL (CC13)
Alkaline Phosphatase: 80 U/L (ref 40–150)
BUN: 18.9 mg/dL (ref 7.0–26.0)
Glucose: 175 mg/dl — ABNORMAL HIGH (ref 70–140)
Total Bilirubin: 0.3 mg/dL (ref 0.20–1.20)

## 2013-03-30 MED ORDER — DEXAMETHASONE SODIUM PHOSPHATE 20 MG/5ML IJ SOLN
20.0000 mg | Freq: Once | INTRAMUSCULAR | Status: AC
  Administered 2013-03-30: 20 mg via INTRAVENOUS

## 2013-03-30 MED ORDER — HEPARIN SOD (PORK) LOCK FLUSH 100 UNIT/ML IV SOLN
500.0000 [IU] | Freq: Once | INTRAVENOUS | Status: AC | PRN
  Administered 2013-03-30: 500 [IU]
  Filled 2013-03-30: qty 5

## 2013-03-30 MED ORDER — DEXAMETHASONE SODIUM PHOSPHATE 20 MG/5ML IJ SOLN
INTRAMUSCULAR | Status: AC
  Filled 2013-03-30: qty 5

## 2013-03-30 MED ORDER — PROMETHAZINE HCL 25 MG/ML IJ SOLN
25.0000 mg | Freq: Once | INTRAMUSCULAR | Status: AC
  Administered 2013-03-30: 25 mg via INTRAVENOUS
  Filled 2013-03-30: qty 1

## 2013-03-30 MED ORDER — LORAZEPAM 1 MG PO TABS
1.0000 mg | ORAL_TABLET | Freq: Once | ORAL | Status: AC | PRN
  Administered 2013-03-30: 1 mg via ORAL

## 2013-03-30 MED ORDER — LORAZEPAM 1 MG PO TABS
ORAL_TABLET | ORAL | Status: AC
  Filled 2013-03-30: qty 1

## 2013-03-30 MED ORDER — FAMOTIDINE IN NACL 20-0.9 MG/50ML-% IV SOLN
20.0000 mg | Freq: Once | INTRAVENOUS | Status: AC
  Administered 2013-03-30: 20 mg via INTRAVENOUS

## 2013-03-30 MED ORDER — SODIUM CHLORIDE 0.9 % IV SOLN
Freq: Once | INTRAVENOUS | Status: AC
  Administered 2013-03-30: 15:00:00 via INTRAVENOUS

## 2013-03-30 MED ORDER — DIPHENHYDRAMINE HCL 12.5 MG/5ML PO ELIX
12.5000 mg | ORAL_SOLUTION | Freq: Once | ORAL | Status: AC
  Administered 2013-03-30: 12.5 mg via ORAL
  Filled 2013-03-30: qty 5

## 2013-03-30 MED ORDER — DIPHENHYDRAMINE HCL 25 MG PO TABS
12.5000 mg | ORAL_TABLET | Freq: Once | ORAL | Status: DC
  Filled 2013-03-30: qty 0.5

## 2013-03-30 MED ORDER — DIPHENHYDRAMINE HCL 50 MG/ML IJ SOLN
INTRAMUSCULAR | Status: AC
  Filled 2013-03-30: qty 1

## 2013-03-30 MED ORDER — PACLITAXEL CHEMO INJECTION 300 MG/50ML
80.0000 mg/m2 | Freq: Once | INTRAVENOUS | Status: AC
  Administered 2013-03-30: 138 mg via INTRAVENOUS
  Filled 2013-03-30: qty 23

## 2013-03-30 MED ORDER — FAMOTIDINE IN NACL 20-0.9 MG/50ML-% IV SOLN
INTRAVENOUS | Status: AC
  Filled 2013-03-30: qty 50

## 2013-03-30 MED ORDER — SODIUM CHLORIDE 0.9 % IJ SOLN
10.0000 mL | INTRAMUSCULAR | Status: DC | PRN
  Administered 2013-03-30: 10 mL
  Filled 2013-03-30: qty 10

## 2013-03-30 NOTE — Patient Instructions (Addendum)
[facility] Discharge Instructions for Patients Receiving Chemotherapy  Today you received the following chemotherapy agents: taxol   To help prevent nausea and vomiting after your treatment, we encourage you to take your nausea medication as prescribed by your physician.   If you develop nausea and vomiting that is not controlled by your nausea medication, call the clinic.   BELOW ARE SYMPTOMS THAT SHOULD BE REPORTED IMMEDIATELY:  *FEVER GREATER THAN 100.5 F  *CHILLS WITH OR WITHOUT FEVER  NAUSEA AND VOMITING THAT IS NOT CONTROLLED WITH YOUR NAUSEA MEDICATION  *UNUSUAL SHORTNESS OF BREATH  *UNUSUAL BRUISING OR BLEEDING  TENDERNESS IN MOUTH AND THROAT WITH OR WITHOUT PRESENCE OF ULCERS  *URINARY PROBLEMS  *BOWEL PROBLEMS  UNUSUAL RASH Items with * indicate a potential emergency and should be followed up as soon as possible.  Feel free to call the clinic you have any questions or concerns. The clinic phone number is [phone].    

## 2013-03-31 ENCOUNTER — Ambulatory Visit (HOSPITAL_BASED_OUTPATIENT_CLINIC_OR_DEPARTMENT_OTHER): Payer: 59

## 2013-03-31 VITALS — BP 108/63 | HR 77 | Temp 97.9°F

## 2013-03-31 DIAGNOSIS — C569 Malignant neoplasm of unspecified ovary: Secondary | ICD-10-CM

## 2013-03-31 DIAGNOSIS — Z5189 Encounter for other specified aftercare: Secondary | ICD-10-CM

## 2013-03-31 LAB — CA 125: CA 125: 19.9 U/mL (ref 0.0–30.2)

## 2013-03-31 MED ORDER — FILGRASTIM 300 MCG/0.5ML IJ SOLN
300.0000 ug | Freq: Once | INTRAMUSCULAR | Status: AC
  Administered 2013-03-31: 300 ug via SUBCUTANEOUS
  Filled 2013-03-31: qty 0.5

## 2013-04-01 ENCOUNTER — Telehealth: Payer: Self-pay

## 2013-04-01 ENCOUNTER — Ambulatory Visit (HOSPITAL_BASED_OUTPATIENT_CLINIC_OR_DEPARTMENT_OTHER): Payer: 59

## 2013-04-01 VITALS — BP 101/58 | HR 75 | Temp 98.1°F

## 2013-04-01 DIAGNOSIS — Z5189 Encounter for other specified aftercare: Secondary | ICD-10-CM

## 2013-04-01 DIAGNOSIS — C569 Malignant neoplasm of unspecified ovary: Secondary | ICD-10-CM

## 2013-04-01 MED ORDER — FILGRASTIM 300 MCG/0.5ML IJ SOLN
300.0000 ug | Freq: Once | INTRAMUSCULAR | Status: AC
  Administered 2013-04-01: 300 ug via SUBCUTANEOUS
  Filled 2013-04-01: qty 0.5

## 2013-04-01 NOTE — Telephone Encounter (Signed)
Message copied by Lorine Bears on Thu Apr 01, 2013 12:21 PM ------      Message from: Jama Flavors P      Created: Wed Mar 31, 2013  8:07 PM       Labs seen and need follow up: please let her know most recent CA 125 is 19.9            Cc LA, TH, MC ------

## 2013-04-01 NOTE — Telephone Encounter (Signed)
Told Candice Zamora the result of her ca-125 as noted below by Dr. Darrold Span.  Pt. Verbalized understanding.

## 2013-04-06 ENCOUNTER — Ambulatory Visit (HOSPITAL_BASED_OUTPATIENT_CLINIC_OR_DEPARTMENT_OTHER): Payer: 59

## 2013-04-06 ENCOUNTER — Ambulatory Visit: Payer: 59

## 2013-04-06 ENCOUNTER — Other Ambulatory Visit: Payer: Self-pay | Admitting: Oncology

## 2013-04-06 ENCOUNTER — Other Ambulatory Visit (HOSPITAL_BASED_OUTPATIENT_CLINIC_OR_DEPARTMENT_OTHER): Payer: 59 | Admitting: Lab

## 2013-04-06 VITALS — BP 96/66 | HR 78 | Temp 97.6°F | Resp 20 | Ht 66.0 in

## 2013-04-06 DIAGNOSIS — C561 Malignant neoplasm of right ovary: Secondary | ICD-10-CM

## 2013-04-06 DIAGNOSIS — Z5111 Encounter for antineoplastic chemotherapy: Secondary | ICD-10-CM

## 2013-04-06 DIAGNOSIS — C569 Malignant neoplasm of unspecified ovary: Secondary | ICD-10-CM

## 2013-04-06 LAB — CBC WITH DIFFERENTIAL/PLATELET
BASO%: 0.2 % (ref 0.0–2.0)
EOS%: 0 % (ref 0.0–7.0)
Eosinophils Absolute: 0 10*3/uL (ref 0.0–0.5)
MCV: 102.5 fL — ABNORMAL HIGH (ref 79.5–101.0)
MONO%: 1.7 % (ref 0.0–14.0)
NEUT#: 3 10*3/uL (ref 1.5–6.5)
RBC: 3.08 10*6/uL — ABNORMAL LOW (ref 3.70–5.45)
RDW: 17.1 % — ABNORMAL HIGH (ref 11.2–14.5)

## 2013-04-06 MED ORDER — DEXAMETHASONE SODIUM PHOSPHATE 20 MG/5ML IJ SOLN
INTRAMUSCULAR | Status: AC
  Filled 2013-04-06: qty 5

## 2013-04-06 MED ORDER — PACLITAXEL CHEMO INJECTION 300 MG/50ML
80.0000 mg/m2 | Freq: Once | INTRAVENOUS | Status: AC
  Administered 2013-04-06: 138 mg via INTRAVENOUS
  Filled 2013-04-06: qty 23

## 2013-04-06 MED ORDER — DEXAMETHASONE SODIUM PHOSPHATE 20 MG/5ML IJ SOLN
20.0000 mg | Freq: Once | INTRAMUSCULAR | Status: AC
  Administered 2013-04-06: 20 mg via INTRAVENOUS

## 2013-04-06 MED ORDER — SODIUM CHLORIDE 0.9 % IV SOLN
526.5000 mg | Freq: Once | INTRAVENOUS | Status: AC
  Administered 2013-04-06: 530 mg via INTRAVENOUS
  Filled 2013-04-06: qty 53

## 2013-04-06 MED ORDER — SODIUM CHLORIDE 0.9 % IJ SOLN
10.0000 mL | INTRAMUSCULAR | Status: DC | PRN
  Administered 2013-04-06: 10 mL
  Filled 2013-04-06: qty 10

## 2013-04-06 MED ORDER — PROMETHAZINE HCL 25 MG/ML IJ SOLN
25.0000 mg | Freq: Once | INTRAMUSCULAR | Status: DC
  Filled 2013-04-06: qty 1

## 2013-04-06 MED ORDER — PROMETHAZINE HCL 25 MG PO TABS
ORAL_TABLET | ORAL | Status: AC
  Filled 2013-04-06: qty 1

## 2013-04-06 MED ORDER — HEPARIN SOD (PORK) LOCK FLUSH 100 UNIT/ML IV SOLN
500.0000 [IU] | Freq: Once | INTRAVENOUS | Status: AC | PRN
  Administered 2013-04-06: 500 [IU]
  Filled 2013-04-06: qty 5

## 2013-04-06 MED ORDER — FAMOTIDINE IN NACL 20-0.9 MG/50ML-% IV SOLN
INTRAVENOUS | Status: AC
  Filled 2013-04-06: qty 50

## 2013-04-06 MED ORDER — DIPHENHYDRAMINE HCL 25 MG PO TABS
12.5000 mg | ORAL_TABLET | Freq: Once | ORAL | Status: AC
  Administered 2013-04-06: 12.5 mg via ORAL
  Filled 2013-04-06: qty 0.5

## 2013-04-06 MED ORDER — LORAZEPAM 1 MG PO TABS
1.0000 mg | ORAL_TABLET | Freq: Once | ORAL | Status: AC | PRN
  Administered 2013-04-06: 1 mg via ORAL

## 2013-04-06 MED ORDER — FAMOTIDINE IN NACL 20-0.9 MG/50ML-% IV SOLN
20.0000 mg | Freq: Once | INTRAVENOUS | Status: AC
  Administered 2013-04-06: 20 mg via INTRAVENOUS

## 2013-04-06 MED ORDER — PROMETHAZINE HCL 25 MG/ML IJ SOLN
25.0000 mg | Freq: Four times a day (QID) | INTRAMUSCULAR | Status: DC | PRN
  Administered 2013-04-06: 25 mg via INTRAVENOUS
  Filled 2013-04-06: qty 1

## 2013-04-06 MED ORDER — LORAZEPAM 1 MG PO TABS
ORAL_TABLET | ORAL | Status: AC
  Filled 2013-04-06: qty 1

## 2013-04-06 MED ORDER — SODIUM CHLORIDE 0.9 % IV SOLN
Freq: Once | INTRAVENOUS | Status: AC
  Administered 2013-04-06: 14:00:00 via INTRAVENOUS

## 2013-04-06 NOTE — Patient Instructions (Signed)
[facility] Discharge Instructions for Patients Receiving Chemotherapy  Today you received the following chemotherapy agents Carboplatin,taxol To help prevent nausea and vomiting after your treatment, we encourage you to take your nausea medication    If you develop nausea and vomiting that is not controlled by your nausea medication, call the clinic.   BELOW ARE SYMPTOMS THAT SHOULD BE REPORTED IMMEDIATELY:  *FEVER GREATER THAN 100.5 F  *CHILLS WITH OR WITHOUT FEVER  NAUSEA AND VOMITING THAT IS NOT CONTROLLED WITH YOUR NAUSEA MEDICATION  *UNUSUAL SHORTNESS OF BREATH  *UNUSUAL BRUISING OR BLEEDING  TENDERNESS IN MOUTH AND THROAT WITH OR WITHOUT PRESENCE OF ULCERS  *URINARY PROBLEMS  *BOWEL PROBLEMS  UNUSUAL RASH Items with * indicate a potential emergency and should be followed up as soon as possible.  Feel free to call the clinic you have any questions or concerns. The clinic phone number is [phone].

## 2013-04-07 ENCOUNTER — Ambulatory Visit (HOSPITAL_BASED_OUTPATIENT_CLINIC_OR_DEPARTMENT_OTHER): Payer: 59

## 2013-04-07 VITALS — BP 121/52 | HR 71 | Temp 97.6°F | Resp 16

## 2013-04-07 DIAGNOSIS — Z5189 Encounter for other specified aftercare: Secondary | ICD-10-CM

## 2013-04-07 DIAGNOSIS — C569 Malignant neoplasm of unspecified ovary: Secondary | ICD-10-CM

## 2013-04-07 MED ORDER — FILGRASTIM 300 MCG/0.5ML IJ SOLN
300.0000 ug | Freq: Once | INTRAMUSCULAR | Status: AC
  Administered 2013-04-07: 300 ug via SUBCUTANEOUS
  Filled 2013-04-07: qty 0.5

## 2013-04-08 ENCOUNTER — Ambulatory Visit: Payer: 59

## 2013-04-08 ENCOUNTER — Ambulatory Visit (HOSPITAL_BASED_OUTPATIENT_CLINIC_OR_DEPARTMENT_OTHER): Payer: 59

## 2013-04-08 VITALS — BP 110/56 | HR 81 | Temp 97.5°F | Resp 20

## 2013-04-08 DIAGNOSIS — C569 Malignant neoplasm of unspecified ovary: Secondary | ICD-10-CM

## 2013-04-08 DIAGNOSIS — Z5189 Encounter for other specified aftercare: Secondary | ICD-10-CM

## 2013-04-08 MED ORDER — FILGRASTIM 300 MCG/0.5ML IJ SOLN
300.0000 ug | Freq: Once | INTRAMUSCULAR | Status: AC
  Administered 2013-04-08: 300 ug via SUBCUTANEOUS
  Filled 2013-04-08: qty 0.5

## 2013-04-09 ENCOUNTER — Ambulatory Visit: Payer: 59 | Attending: Gynecology | Admitting: Gynecology

## 2013-04-09 ENCOUNTER — Encounter: Payer: Self-pay | Admitting: Gynecology

## 2013-04-09 VITALS — BP 100/67 | HR 80 | Temp 97.8°F | Resp 16

## 2013-04-09 DIAGNOSIS — C569 Malignant neoplasm of unspecified ovary: Secondary | ICD-10-CM | POA: Insufficient documentation

## 2013-04-09 DIAGNOSIS — C561 Malignant neoplasm of right ovary: Secondary | ICD-10-CM

## 2013-04-09 DIAGNOSIS — E039 Hypothyroidism, unspecified: Secondary | ICD-10-CM | POA: Insufficient documentation

## 2013-04-09 DIAGNOSIS — Z9079 Acquired absence of other genital organ(s): Secondary | ICD-10-CM | POA: Insufficient documentation

## 2013-04-09 DIAGNOSIS — Z79899 Other long term (current) drug therapy: Secondary | ICD-10-CM | POA: Insufficient documentation

## 2013-04-09 DIAGNOSIS — Z90710 Acquired absence of both cervix and uterus: Secondary | ICD-10-CM | POA: Insufficient documentation

## 2013-04-09 NOTE — Patient Instructions (Signed)
Following completion of your sixth cycle of chemotherapy we will obtain a CT scan approximately a month later. Return to see me for a followup appointment on 06/22/2013.

## 2013-04-09 NOTE — Progress Notes (Signed)
Consult Note: Gyn-Onc   Matthias Hughs 47 y.o. female  Chief Complaint  Patient presents with  . Ovarian Cancer    Follow up visit     Assessment : Stage IIB ovarian cancer currently receiving cycle 5 of dose dense carboplatin and Taxol. She is doing well and is clinically free of disease. Plan: We will continue dose dense carboplatin and Taxol through a total of 6 cycles. She should be scheduled for a CT scan approximate month after completing chemotherapy (early December). I will plan on seeing the patient back to review the CT scan on December 9.    Interval history: The patient returns today as previously scheduled. She is now completed 5 cycles of dose dense carboplatin and Taxol chemotherapy. She's tolerating the therapy quite well with only very mild neuropathy and no significant GI toxicity. .. Appetite is good she has no GI or GU symptoms.  Her functional status is much improved and she has returned to work part-time basis. Noted that her last CA 125 was 19 units per mL on 03/30/2013.    HPI:  18 her old white married female gravida 4 para 2 seen in consultation request of Dr.Marie-Lyne Seymour Bars regarding management of a newly diagnosed complex pelvic mass. The patient has had several months of lower nominal pain and initially underwent a pelvic ultrasound on April 28 that showed a 10.8 x 8.7 x 9.2 cm heterogeneous pelvic mass. Small fibroids were also noted. The adnexa were poorly seen. Subsequently she underwent a MRI of the pelvis which demonstrated a centrally necrotic pelvic mass which may arise from the posterior uterus or the right adnexa. She had a normal-appearing left ovary moderate ascites and no evidence of adenopathy. CA 125 is 226 units per mL CEA 1.5 (normal)OVA1 was 9.4 (upper limits of normal for premenopausal patient is 5) Patient underwent exploratory laparotomy, TAH, BSO, omentectomy, pelvic and aortic lymphadenectomy at [facility] on 11/24/2012. She had 11 x 9 x 7 cm right  adnexal mass which was densely adherent to the right pelvic sidewall (stage IIB). 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 All other biopsies including pelvic and para-aortic lymphadenectomy were negative.  Postoperatively she was treated with dose dense carboplatin and Taxol. Review of Systems:10 point review of systems is negative except as noted in interval history.   Vitals: Blood pressure 100/67, pulse 80, temperature 97.8 F (36.6 C), temperature source Oral, resp. rate 16, last menstrual period 10/25/2012.  Physical Exam: General : The patient is a healthy woman in no acute distress.  HEENT: normocephalic, extraoccular movements normal; neck is supple without thyromegally  Lynphnodes: Supraclavicular and inguinal nodes not enlarged  Abdomen: Soft, non-tender, no ascites, no organomegally., no hernias, incision is well-healed Pelvic:  EGBUS: Normal female  Vagina: Normal, no lesions , the cuff is healed. Urethra and Bladder: Normal, non-tender  Cervix: Surgically absent   Uterus: I surgically absent   Rectal: normal sphincter ton, confirmed pelvic mass., no blood  Lower extremities: No edema or varicosities. Normal range of motion      Allergies  Allergen Reactions  . Compazine [Prochlorperazine Maleate] Other (See Comments)    Jittery  . Zofran [Ondansetron Hcl] Other (See Comments)    headache  . Sulfa Antibiotics Rash    Past Medical History  Diagnosis Date  . Ectopic pregnancy   . History of chicken pox   . Pelvic mass in female   . Abdominal pain   . Infertility, female   . Hemorrhoids   .  Goiter   . Hypothyroidism   . Dermoid cyst     Past Surgical History  Procedure Laterality Date  . Laparoscopy for ectopic pregnancy      10 years ago    Current Outpatient Prescriptions  Medication Sig Dispense Refill  . acyclovir (ZOVIRAX) 200 MG capsule Take 200 mg by mouth 2 (two) times daily.      . cyclobenzaprine (FLEXERIL) 10 MG tablet  Take 1 tablet (10 mg total) by mouth 3 (three) times daily as needed for muscle spasms.  20 tablet  1  . dexamethasone (DECADRON) 4 MG tablet 5 tablets (20 mg) with food 12 hours  prior to chemo      . levothyroxine (SYNTHROID, LEVOTHROID) 50 MCG tablet Take 50 mcg by mouth daily.      Marland Kitchen lidocaine-prilocaine (EMLA) cream Apply topically as needed. To port prior to access  30 g  1  . loratadine (CLARITIN) 10 MG tablet Take 10 mg by mouth daily.      Marland Kitchen LORazepam (ATIVAN) 1 MG tablet 1/2- 1 tablet by mouth or under tongue every 4-6 hours as needed for nausea. Will make you drowsy  20 tablet  0  . LYRICA 75 MG capsule       . oxyCODONE-acetaminophen (PERCOCET/ROXICET) 5-325 MG per tablet Take 1 tablet by mouth every 6 (six) hours as needed for pain.  15 tablet  0  . promethazine (PHENERGAN) 25 MG suppository Place 1 suppository (25 mg total) rectally every 6 (six) hours as needed for nausea.  12 each  1  . promethazine (PHENERGAN) 25 MG tablet Take 1 tablet (25 mg total) by mouth every 6 (six) hours as needed for nausea.  30 tablet  1  . traMADol (ULTRAM) 50 MG tablet Take 50-100 mg by mouth every 6 (six) hours as needed for pain.       No current facility-administered medications for this visit.    History   Social History  . Marital Status: Married    Spouse Name: N/A    Number of Children: N/A  . Years of Education: N/A   Occupational History  . Not on file.   Social History Main Topics  . Smoking status: Never Smoker   . Smokeless tobacco: Not on file  . Alcohol Use: No  . Drug Use: No  . Sexual Activity: Not on file   Other Topics Concern  . Not on file   Social History Narrative  . No narrative on file    Family History  Problem Relation Age of Onset  . Hypothyroidism Mother   . Heart disease Mother   . Uterine cancer Mother   . Heart disease Father   . Heart attack Father   . Hypothyroidism Sister   . Breast cancer Maternal Aunt   . Diabetes Maternal Grandfather    . Diabetes Paternal Grandmother       Jeannette Corpus, MD 04/09/2013, 3:02 PM

## 2013-04-10 ENCOUNTER — Other Ambulatory Visit: Payer: Self-pay | Admitting: Oncology

## 2013-04-12 ENCOUNTER — Ambulatory Visit (HOSPITAL_BASED_OUTPATIENT_CLINIC_OR_DEPARTMENT_OTHER): Payer: 59 | Admitting: Oncology

## 2013-04-12 ENCOUNTER — Other Ambulatory Visit (HOSPITAL_BASED_OUTPATIENT_CLINIC_OR_DEPARTMENT_OTHER): Payer: 59

## 2013-04-12 ENCOUNTER — Encounter: Payer: Self-pay | Admitting: Oncology

## 2013-04-12 VITALS — BP 111/47 | HR 85 | Temp 98.1°F | Resp 20 | Ht 66.0 in | Wt 158.9 lb

## 2013-04-12 DIAGNOSIS — C561 Malignant neoplasm of right ovary: Secondary | ICD-10-CM

## 2013-04-12 DIAGNOSIS — C569 Malignant neoplasm of unspecified ovary: Secondary | ICD-10-CM

## 2013-04-12 LAB — CBC WITH DIFFERENTIAL/PLATELET
BASO%: 0.7 % (ref 0.0–2.0)
Basophils Absolute: 0 10*3/uL (ref 0.0–0.1)
HCT: 28.4 % — ABNORMAL LOW (ref 34.8–46.6)
HGB: 9.7 g/dL — ABNORMAL LOW (ref 11.6–15.9)
LYMPH%: 32.5 % (ref 14.0–49.7)
MCH: 35.4 pg — ABNORMAL HIGH (ref 25.1–34.0)
MCHC: 34.1 g/dL (ref 31.5–36.0)
MONO#: 0.3 10*3/uL (ref 0.1–0.9)
NEUT#: 1.5 10*3/uL (ref 1.5–6.5)
NEUT%: 54.6 % (ref 38.4–76.8)
Platelets: 148 10*3/uL (ref 145–400)
WBC: 2.7 10*3/uL — ABNORMAL LOW (ref 3.9–10.3)

## 2013-04-12 NOTE — Progress Notes (Signed)
This encounter was created in error - please disregard.

## 2013-04-12 NOTE — Progress Notes (Signed)
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 She received day 1 cycle 5 on 04-06-13, with carboplatin dose increased back to that used for cycles 2 and 3, after decrease with cycle 4 due to counts day of treatment. She had longer nausea after most recent treatment, lasting 4 days. Note she does not tolerate zofran or compazine, dislikes ativan, does have phenergan available. We have discussed EMEND (which she may not tolerate as with zofran, or additional decadron, which she prefers not to do); she would rather not change antiemetics, will call if unable to keep down po;s.   ONCOLOGIC HISTORY Patient presented with RUQ pain in March 2014, with ED evaluation then including abdominal US not remarkable. She was then found to have pelvic mass, with MRI pelvis in [facility] system 11-11-12 showing centrally necrotic pelvic mass 11.4 x 9.1 x 7.8 cm. CA 125 by Dr Seymour Bars 11-13-2012 was 226.4. She saw Dr Yolande Jolly in [place] on 11-17-12 and went to surgery by him at [facility] on 11-24-12. Surgery was complete debulking including TAH, BSO, omentectomy, right ureterolysis, bilateral pelvic lymphadenectomy and periaortic lymphadenectomy. There is no mention in the operative note of rupture of the ovarian capsule. 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 Pelvic washings ([id number]) negative. Patient was hospitalized at [facility] x 5 days; per patient and husband, she was transfused 2 units PRBCs at [facility]. Post operative  course was unremarkable until she developed acute herpes zoster left ~ T 11-12 dermatome on 12-12-12, then severe pain left inguinal region and upper lateral left thigh such that start of dose dense adjuvant taxol carboplatin was delayed x2 prior to beginning 01-05-13. She has required neupogen x 1-2 days after each treatment.  Note preoperative CA 125 on 11-13-12 was 226 and was 157 post operatively on 12-08-12; this was down to 29 on 01-19-13 and was 19.9 on 03-30-13.   Review of systems as above, also: Bowels moving. No new or different pain, no longer any discomfort from the zoster or inguinal/ LE pain. No swelling LE. No fever or symptoms of infection. No SOB or other respiratory symptoms. No problems with PAC. No bleeding. Remainder of 10 point Review of Systems negative.  Objective:  Vital signs in last 24 hours:  BP 111/47  Pulse 85  Temp(Src) 98.1 F (36.7 C) (Oral)  Resp 20  Ht 5\' 6"  (1.676 m)  Wt 158 lb 14.4 oz (72.077 kg)  BMI 25.66 kg/m2  LMP 10/25/2012 weight up 5.5 lbs  Alert, oriented and appropriate. Ambulatory without difficulty.  Total alopecia  HEENT:PERRL, sclerae not icteric. Oral mucosa moist without lesions, posterior pharynx clear.  Neck supple. No JVD.  Lymphatics:no cervical,suraclavicular, axillary or inguinal adenopathy Resp: clear to auscultation bilaterally and normal percussion bilaterally Cardio: regular rate and rhythm. No gallop. GI: soft, nontender, not distended, no mass or organomegaly. Normally active bowel sounds. Surgical incision not remarkable. Musculoskeletal/ Extremities: without pitting edema, cords, tenderness Neuro: no peripheral neuropathy. Otherwise nonfocal Skin without rash, ecchymosis, petechiae Portacath-without erythema or tenderness  Lab Results:  Results for orders  placed in visit on 04/12/13  CBC WITH DIFFERENTIAL      Result Value Range   WBC 2.7 (*) 3.9 - 10.3 10e3/uL   NEUT# 1.5  1.5 - 6.5 10e3/uL   HGB 9.7 (*) 11.6 -  15.9 g/dL   HCT 30.8 (*) 65.7 - 84.6 %   Platelets 148  145 - 400 10e3/uL   MCV 103.6 (*) 79.5 - 101.0 fL   MCH 35.4 (*) 25.1 - 34.0 pg   MCHC 34.1  31.5 - 36.0 g/dL   RBC 9.62 (*) 9.52 - 8.41 10e6/uL   RDW 15.6 (*) 11.2 - 14.5 %   lymph# 0.9  0.9 - 3.3 10e3/uL   MONO# 0.3  0.1 - 0.9 10e3/uL   Eosinophils Absolute 0.1  0.0 - 0.5 10e3/uL   Basophils Absolute 0.0  0.0 - 0.1 10e3/uL   NEUT% 54.6  38.4 - 76.8 %   LYMPH% 32.5  14.0 - 49.7 %   MONO% 10.3  0.0 - 14.0 %   EOS% 1.9  0.0 - 7.0 %   BASO% 0.7  0.0 - 2.0 %   CA 125 on 03-30-13  19.9. CMET 03-30-13 noted  Studies/Results:  No results found.  Medications: I have reviewed the patient's current medications. Discussed antiemetics as above, but decided not to try changes.  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 She will see Dr Yolande Jolly 06-22-13 and needs CTs prior to that visit (not yet set up) 2.continuing prophylactic acyclovir for duration of chemo due to premedication steroids and previous acute zoster  3. Left LE pain: likely also related to the zoster, resolved.  6.hypothyroid on replacement  3.lactose and gluten intolerance  4.PAC in, placed at [facility].  5.oral candida previously  6.intolerance to zofran with HA, severe restless legs with benadryl, intolerance to compazine, dislikes ativan. Using primarily phenergan for nausea. 7.no flu vaccine yet  Patient understood discussion well and is in agreement with plan    Keefer Soulliere P, MD   04/12/2013, 3:08 PM

## 2013-04-12 NOTE — Patient Instructions (Signed)
Pick up appointments when you are at office for next treatment

## 2013-04-13 ENCOUNTER — Telehealth: Payer: Self-pay | Admitting: *Deleted

## 2013-04-13 ENCOUNTER — Ambulatory Visit (HOSPITAL_BASED_OUTPATIENT_CLINIC_OR_DEPARTMENT_OTHER): Payer: 59

## 2013-04-13 ENCOUNTER — Telehealth: Payer: Self-pay | Admitting: Oncology

## 2013-04-13 ENCOUNTER — Other Ambulatory Visit: Payer: Self-pay | Admitting: *Deleted

## 2013-04-13 VITALS — BP 118/62 | HR 101 | Temp 97.7°F | Resp 20

## 2013-04-13 DIAGNOSIS — C561 Malignant neoplasm of right ovary: Secondary | ICD-10-CM

## 2013-04-13 DIAGNOSIS — C569 Malignant neoplasm of unspecified ovary: Secondary | ICD-10-CM

## 2013-04-13 DIAGNOSIS — Z5111 Encounter for antineoplastic chemotherapy: Secondary | ICD-10-CM

## 2013-04-13 MED ORDER — LORAZEPAM 1 MG PO TABS
1.0000 mg | ORAL_TABLET | Freq: Once | ORAL | Status: AC | PRN
  Administered 2013-04-13: 1 mg via ORAL

## 2013-04-13 MED ORDER — DIPHENHYDRAMINE HCL 25 MG PO TABS
12.5000 mg | ORAL_TABLET | Freq: Once | ORAL | Status: AC
  Administered 2013-04-13: 12.5 mg via ORAL
  Filled 2013-04-13: qty 0.5

## 2013-04-13 MED ORDER — PROMETHAZINE HCL 25 MG/ML IJ SOLN
25.0000 mg | Freq: Once | INTRAMUSCULAR | Status: AC
  Administered 2013-04-13: 25 mg via INTRAVENOUS
  Filled 2013-04-13: qty 1

## 2013-04-13 MED ORDER — LORAZEPAM 1 MG PO TABS
ORAL_TABLET | ORAL | Status: AC
  Filled 2013-04-13: qty 1

## 2013-04-13 MED ORDER — DEXAMETHASONE SODIUM PHOSPHATE 20 MG/5ML IJ SOLN
INTRAMUSCULAR | Status: AC
  Filled 2013-04-13: qty 5

## 2013-04-13 MED ORDER — DEXAMETHASONE SODIUM PHOSPHATE 20 MG/5ML IJ SOLN
20.0000 mg | Freq: Once | INTRAMUSCULAR | Status: AC
  Administered 2013-04-13: 20 mg via INTRAVENOUS

## 2013-04-13 MED ORDER — HEPARIN SOD (PORK) LOCK FLUSH 100 UNIT/ML IV SOLN
500.0000 [IU] | Freq: Once | INTRAVENOUS | Status: AC | PRN
  Administered 2013-04-13: 500 [IU]
  Filled 2013-04-13: qty 5

## 2013-04-13 MED ORDER — FAMOTIDINE IN NACL 20-0.9 MG/50ML-% IV SOLN
INTRAVENOUS | Status: AC
  Filled 2013-04-13: qty 50

## 2013-04-13 MED ORDER — FAMOTIDINE IN NACL 20-0.9 MG/50ML-% IV SOLN
20.0000 mg | Freq: Once | INTRAVENOUS | Status: AC
  Administered 2013-04-13: 20 mg via INTRAVENOUS

## 2013-04-13 MED ORDER — PACLITAXEL CHEMO INJECTION 300 MG/50ML
80.0000 mg/m2 | Freq: Once | INTRAVENOUS | Status: AC
  Administered 2013-04-13: 138 mg via INTRAVENOUS
  Filled 2013-04-13: qty 23

## 2013-04-13 MED ORDER — SODIUM CHLORIDE 0.9 % IJ SOLN
10.0000 mL | INTRAMUSCULAR | Status: DC | PRN
  Administered 2013-04-13: 10 mL
  Filled 2013-04-13: qty 10

## 2013-04-13 MED ORDER — SODIUM CHLORIDE 0.9 % IV SOLN
Freq: Once | INTRAVENOUS | Status: AC
  Administered 2013-04-13: 15:00:00 via INTRAVENOUS

## 2013-04-13 NOTE — Telephone Encounter (Signed)
lvm for pt concerning extended oct appts.Marland KitchenMarland Kitchenpt will get sched at todays visit

## 2013-04-13 NOTE — Patient Instructions (Addendum)
[facility] Discharge Instructions for Patients Receiving Chemotherapy  Today you received the following chemotherapy agents: Taxol.  To help prevent nausea and vomiting after your treatment, we encourage you to take your nausea medication as prescribed.   If you develop nausea and vomiting that is not controlled by your nausea medication, call the clinic.   BELOW ARE SYMPTOMS THAT SHOULD BE REPORTED IMMEDIATELY:  *FEVER GREATER THAN 100.5 F  *CHILLS WITH OR WITHOUT FEVER  NAUSEA AND VOMITING THAT IS NOT CONTROLLED WITH YOUR NAUSEA MEDICATION  *UNUSUAL SHORTNESS OF BREATH  *UNUSUAL BRUISING OR BLEEDING  TENDERNESS IN MOUTH AND THROAT WITH OR WITHOUT PRESENCE OF ULCERS  *URINARY PROBLEMS  *BOWEL PROBLEMS  UNUSUAL RASH Items with * indicate a potential emergency and should be followed up as soon as possible.  Feel free to call the clinic you have any questions or concerns. The clinic phone number is [phone].    

## 2013-04-13 NOTE — Telephone Encounter (Signed)
Per staff message and POF I have scheduled appts.  JMW  

## 2013-04-14 ENCOUNTER — Ambulatory Visit (HOSPITAL_BASED_OUTPATIENT_CLINIC_OR_DEPARTMENT_OTHER): Payer: 59

## 2013-04-14 VITALS — BP 108/57 | HR 85 | Temp 97.7°F

## 2013-04-14 DIAGNOSIS — Z5189 Encounter for other specified aftercare: Secondary | ICD-10-CM

## 2013-04-14 DIAGNOSIS — C569 Malignant neoplasm of unspecified ovary: Secondary | ICD-10-CM

## 2013-04-14 MED ORDER — FILGRASTIM 300 MCG/0.5ML IJ SOLN
300.0000 ug | Freq: Once | INTRAMUSCULAR | Status: AC
  Administered 2013-04-14: 300 ug via SUBCUTANEOUS
  Filled 2013-04-14: qty 0.5

## 2013-04-15 ENCOUNTER — Ambulatory Visit (HOSPITAL_BASED_OUTPATIENT_CLINIC_OR_DEPARTMENT_OTHER): Payer: 59

## 2013-04-15 VITALS — BP 109/55 | HR 77 | Temp 97.4°F

## 2013-04-15 DIAGNOSIS — C569 Malignant neoplasm of unspecified ovary: Secondary | ICD-10-CM

## 2013-04-15 DIAGNOSIS — Z5189 Encounter for other specified aftercare: Secondary | ICD-10-CM

## 2013-04-15 MED ORDER — FILGRASTIM 300 MCG/0.5ML IJ SOLN
300.0000 ug | Freq: Once | INTRAMUSCULAR | Status: AC
  Administered 2013-04-15: 300 ug via SUBCUTANEOUS
  Filled 2013-04-15: qty 0.5

## 2013-04-20 ENCOUNTER — Ambulatory Visit (HOSPITAL_BASED_OUTPATIENT_CLINIC_OR_DEPARTMENT_OTHER): Payer: 59

## 2013-04-20 ENCOUNTER — Other Ambulatory Visit (HOSPITAL_BASED_OUTPATIENT_CLINIC_OR_DEPARTMENT_OTHER): Payer: 59 | Admitting: Lab

## 2013-04-20 ENCOUNTER — Telehealth: Payer: Self-pay

## 2013-04-20 VITALS — BP 106/70 | HR 83 | Temp 97.7°F | Resp 18

## 2013-04-20 DIAGNOSIS — C569 Malignant neoplasm of unspecified ovary: Secondary | ICD-10-CM

## 2013-04-20 DIAGNOSIS — Z5111 Encounter for antineoplastic chemotherapy: Secondary | ICD-10-CM

## 2013-04-20 DIAGNOSIS — C561 Malignant neoplasm of right ovary: Secondary | ICD-10-CM

## 2013-04-20 LAB — COMPREHENSIVE METABOLIC PANEL (CC13)
ALT: 22 U/L (ref 0–55)
AST: 20 U/L (ref 5–34)
Albumin: 3.8 g/dL (ref 3.5–5.0)
Alkaline Phosphatase: 79 U/L (ref 40–150)
Anion Gap: 10 mEq/L (ref 3–11)
BUN: 19.8 mg/dL (ref 7.0–26.0)
CO2: 24 mEq/L (ref 22–29)
Calcium: 9.8 mg/dL (ref 8.4–10.4)
Chloride: 107 mEq/L (ref 98–109)
Creatinine: 0.9 mg/dL (ref 0.6–1.1)
Glucose: 247 mg/dl — ABNORMAL HIGH (ref 70–140)
Potassium: 4.2 mEq/L (ref 3.5–5.1)

## 2013-04-20 LAB — CBC WITH DIFFERENTIAL/PLATELET
Basophils Absolute: 0 10*3/uL (ref 0.0–0.1)
EOS%: 0 % (ref 0.0–7.0)
Eosinophils Absolute: 0 10*3/uL (ref 0.0–0.5)
HCT: 28 % — ABNORMAL LOW (ref 34.8–46.6)
HGB: 9.5 g/dL — ABNORMAL LOW (ref 11.6–15.9)
MCH: 35.9 pg — ABNORMAL HIGH (ref 25.1–34.0)
MONO#: 0 10*3/uL — ABNORMAL LOW (ref 0.1–0.9)
MONO%: 1.5 % (ref 0.0–14.0)
NEUT#: 2.3 10*3/uL (ref 1.5–6.5)
NEUT%: 89 % — ABNORMAL HIGH (ref 38.4–76.8)
RDW: 15.4 % — ABNORMAL HIGH (ref 11.2–14.5)
WBC: 2.6 10*3/uL — ABNORMAL LOW (ref 3.9–10.3)
lymph#: 0.2 10*3/uL — ABNORMAL LOW (ref 0.9–3.3)

## 2013-04-20 MED ORDER — FAMOTIDINE IN NACL 20-0.9 MG/50ML-% IV SOLN
INTRAVENOUS | Status: AC
  Filled 2013-04-20: qty 50

## 2013-04-20 MED ORDER — FAMOTIDINE IN NACL 20-0.9 MG/50ML-% IV SOLN
20.0000 mg | Freq: Once | INTRAVENOUS | Status: AC
  Administered 2013-04-20: 20 mg via INTRAVENOUS

## 2013-04-20 MED ORDER — SODIUM CHLORIDE 0.9 % IV SOLN
Freq: Once | INTRAVENOUS | Status: AC
  Administered 2013-04-20: 16:00:00 via INTRAVENOUS

## 2013-04-20 MED ORDER — DIPHENHYDRAMINE HCL 25 MG PO TABS
12.5000 mg | ORAL_TABLET | Freq: Once | ORAL | Status: AC
  Administered 2013-04-20: 12.5 mg via ORAL
  Filled 2013-04-20: qty 0.5

## 2013-04-20 MED ORDER — SODIUM CHLORIDE 0.9 % IJ SOLN
10.0000 mL | INTRAMUSCULAR | Status: DC | PRN
  Administered 2013-04-20: 10 mL
  Filled 2013-04-20: qty 10

## 2013-04-20 MED ORDER — DEXAMETHASONE SODIUM PHOSPHATE 20 MG/5ML IJ SOLN
INTRAMUSCULAR | Status: AC
  Filled 2013-04-20: qty 5

## 2013-04-20 MED ORDER — PROMETHAZINE HCL 25 MG/ML IJ SOLN
25.0000 mg | Freq: Once | INTRAMUSCULAR | Status: AC
  Administered 2013-04-20: 25 mg via INTRAVENOUS
  Filled 2013-04-20: qty 1

## 2013-04-20 MED ORDER — LORAZEPAM 1 MG PO TABS
ORAL_TABLET | ORAL | Status: AC
  Filled 2013-04-20: qty 1

## 2013-04-20 MED ORDER — PACLITAXEL CHEMO INJECTION 300 MG/50ML
80.0000 mg/m2 | Freq: Once | INTRAVENOUS | Status: AC
  Administered 2013-04-20: 138 mg via INTRAVENOUS
  Filled 2013-04-20: qty 23

## 2013-04-20 MED ORDER — DEXAMETHASONE SODIUM PHOSPHATE 20 MG/5ML IJ SOLN
20.0000 mg | Freq: Once | INTRAMUSCULAR | Status: AC
  Administered 2013-04-20: 20 mg via INTRAVENOUS

## 2013-04-20 MED ORDER — HEPARIN SOD (PORK) LOCK FLUSH 100 UNIT/ML IV SOLN
500.0000 [IU] | Freq: Once | INTRAVENOUS | Status: AC | PRN
  Administered 2013-04-20: 500 [IU]
  Filled 2013-04-20: qty 5

## 2013-04-20 MED ORDER — LORAZEPAM 1 MG PO TABS
1.0000 mg | ORAL_TABLET | Freq: Once | ORAL | Status: AC | PRN
  Administered 2013-04-20: 1 mg via ORAL

## 2013-04-20 NOTE — Patient Instructions (Addendum)
[facility] Discharge Instructions for Patients Receiving Chemotherapy  Today you received the following chemotherapy agents Taxol  To help prevent nausea and vomiting after your treatment, we encourage you to take your nausea medication    If you develop nausea and vomiting that is not controlled by your nausea medication, call the clinic.   BELOW ARE SYMPTOMS THAT SHOULD BE REPORTED IMMEDIATELY:  *FEVER GREATER THAN 100.5 F  *CHILLS WITH OR WITHOUT FEVER  NAUSEA AND VOMITING THAT IS NOT CONTROLLED WITH YOUR NAUSEA MEDICATION  *UNUSUAL SHORTNESS OF BREATH  *UNUSUAL BRUISING OR BLEEDING  TENDERNESS IN MOUTH AND THROAT WITH OR WITHOUT PRESENCE OF ULCERS  *URINARY PROBLEMS  *BOWEL PROBLEMS  UNUSUAL RASH Items with * indicate a potential emergency and should be followed up as soon as possible.  Feel free to call the clinic you have any questions or concerns. The clinic phone number is [phone].    

## 2013-04-20 NOTE — Telephone Encounter (Signed)
Ms. Mostek has an opportunity to get a free flu shot today through work.  Told her that she has a treatment today and that would not be wise to receive it today.  She needs to discuss this with Dr. Darrold Span at her next visit.  Pt. Verbalized understanding.

## 2013-04-21 ENCOUNTER — Ambulatory Visit (HOSPITAL_BASED_OUTPATIENT_CLINIC_OR_DEPARTMENT_OTHER): Payer: 59

## 2013-04-21 VITALS — BP 112/63 | HR 78 | Temp 97.6°F

## 2013-04-21 DIAGNOSIS — Z5189 Encounter for other specified aftercare: Secondary | ICD-10-CM

## 2013-04-21 DIAGNOSIS — C569 Malignant neoplasm of unspecified ovary: Secondary | ICD-10-CM

## 2013-04-21 MED ORDER — FILGRASTIM 300 MCG/0.5ML IJ SOLN
300.0000 ug | Freq: Once | INTRAMUSCULAR | Status: AC
  Administered 2013-04-21: 300 ug via SUBCUTANEOUS
  Filled 2013-04-21: qty 0.5

## 2013-04-22 ENCOUNTER — Encounter (INDEPENDENT_AMBULATORY_CARE_PROVIDER_SITE_OTHER): Payer: Self-pay

## 2013-04-22 ENCOUNTER — Ambulatory Visit (HOSPITAL_BASED_OUTPATIENT_CLINIC_OR_DEPARTMENT_OTHER): Payer: 59

## 2013-04-22 VITALS — BP 98/54 | HR 81 | Temp 98.0°F

## 2013-04-22 DIAGNOSIS — C569 Malignant neoplasm of unspecified ovary: Secondary | ICD-10-CM

## 2013-04-22 DIAGNOSIS — Z5189 Encounter for other specified aftercare: Secondary | ICD-10-CM

## 2013-04-22 MED ORDER — FILGRASTIM 300 MCG/0.5ML IJ SOLN
300.0000 ug | Freq: Once | INTRAMUSCULAR | Status: AC
  Administered 2013-04-22: 300 ug via SUBCUTANEOUS
  Filled 2013-04-22: qty 0.5

## 2013-04-26 ENCOUNTER — Other Ambulatory Visit (HOSPITAL_BASED_OUTPATIENT_CLINIC_OR_DEPARTMENT_OTHER): Payer: 59 | Admitting: Lab

## 2013-04-26 ENCOUNTER — Ambulatory Visit (HOSPITAL_BASED_OUTPATIENT_CLINIC_OR_DEPARTMENT_OTHER): Payer: 59 | Admitting: Oncology

## 2013-04-26 ENCOUNTER — Encounter: Payer: Self-pay | Admitting: Oncology

## 2013-04-26 ENCOUNTER — Encounter (INDEPENDENT_AMBULATORY_CARE_PROVIDER_SITE_OTHER): Payer: Self-pay

## 2013-04-26 ENCOUNTER — Other Ambulatory Visit: Payer: Self-pay

## 2013-04-26 VITALS — BP 111/68 | HR 80 | Temp 98.0°F | Resp 18 | Ht 66.0 in | Wt 161.7 lb

## 2013-04-26 DIAGNOSIS — C561 Malignant neoplasm of right ovary: Secondary | ICD-10-CM

## 2013-04-26 DIAGNOSIS — C569 Malignant neoplasm of unspecified ovary: Secondary | ICD-10-CM

## 2013-04-26 DIAGNOSIS — D6481 Anemia due to antineoplastic chemotherapy: Secondary | ICD-10-CM

## 2013-04-26 LAB — CBC WITH DIFFERENTIAL/PLATELET
BASO%: 0.7 % (ref 0.0–2.0)
Basophils Absolute: 0 10*3/uL (ref 0.0–0.1)
Eosinophils Absolute: 0 10*3/uL (ref 0.0–0.5)
HCT: 30.5 % — ABNORMAL LOW (ref 34.8–46.6)
LYMPH%: 36.6 % (ref 14.0–49.7)
MCV: 105.9 fL — ABNORMAL HIGH (ref 79.5–101.0)
MONO#: 0.3 10*3/uL (ref 0.1–0.9)
NEUT#: 1 10*3/uL — ABNORMAL LOW (ref 1.5–6.5)
Platelets: 180 10*3/uL (ref 145–400)
RBC: 2.89 10*6/uL — ABNORMAL LOW (ref 3.70–5.45)
WBC: 2.2 10*3/uL — ABNORMAL LOW (ref 3.9–10.3)
lymph#: 0.8 10*3/uL — ABNORMAL LOW (ref 0.9–3.3)

## 2013-04-26 MED ORDER — DEXAMETHASONE 4 MG PO TABS: ORAL_TABLET | ORAL | Status: DC

## 2013-04-26 MED ORDER — HEMOCYTE 324 (106 FE) MG PO TABS: 1.0000 | ORAL_TABLET | Freq: Every day | ORAL | Status: DC

## 2013-04-26 NOTE — Progress Notes (Signed)
OFFICE PROGRESS NOTE   04/26/2013   Physicians:Daniel ClarkePearson; Maurice Small, MD, Ronnald Ramp, Rodolph Bong   INTERVAL HISTORY:  Patient is seen, alone for visit, in continuing attention to adjuvant chemotherapy in process with dose dense taxol carboplatin for IIB grade 3 carcinoma of right ovary, due to start cycle 6 on 04-27-13. Due to cytopenias, carboplatin was reduced to AUC =3 by cycle 4, but with improvement in counts by cycle 5 this was increased to AUC=4.5 for that treatment. ANC is just 1.0 today despite neupogen x 2 days after each treatment, such that we will give taxol only on day 1 cycle 6, then add carboplatin to day 8 of this last cycle if ANC >=1.5 by that day. Plan is to repeat CT AP shortly prior to return visit to Dr Yolande Jolly ~ a month after completing chemotherapy. Patient has been generally fatigued this week, but has been able to work and do other usual activities. She has had no fever or symptoms of infection.  New problem is 2 episodes of weakness LLE resulting in falls, once when she stepped out of car onto LLE and again when she stepped down stair onto LLE. There was no associated pain LE or back, no injury from the fall, no change in localized numbness left upper inner thigh which she has had since surgery and since zoster just prior to starting chemotherapy. Strength in LLE is decreased c/w RLE as it has been thru all of this. She is otherwise ambulating and using the leg as usual. She has no other neurologic symptoms.   ONCOLOGIC HISTORY Patient presented with RUQ pain in March 2014, with ED evaluation then including abdominal US not remarkable. She was then found to have pelvic mass, with MRI pelvis in [place] system 11-11-12 showing centrally necrotic pelvic mass 11.4 x 9.1 x 7.8 cm. CA 125 by Dr Seymour Bars 11-13-2012 was 226.4. She saw Dr Yolande Jolly in [place] on 11-17-12 and went to surgery by him at [facility] on 11-24-12. Surgery was complete debulking including  TAH, BSO, omentectomy, right ureterolysis, bilateral pelvic lymphadenectomy and periaortic lymphadenectomy. There is no mention in the operative note of rupture of the ovarian capsule. 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 Pelvic washings ([id number]) negative. Patient was hospitalized at [facility] x 5 days; per patient and husband, she was transfused 2 units PRBCs at [facility]. Post operative course was unremarkable until she developed acute herpes zoster left ~ T 11-12 dermatome on 12-12-12, then severe pain left inguinal region and upper lateral left thigh such that start of dose dense adjuvant taxol carboplatin was delayed x2 prior to beginning 01-05-13. She has required neupogen x 1-2 days after each treatment.  Note preoperative CA 125 on 11-13-12 was 226 and was 157 post operatively on 12-08-12; this was down to 29 on 01-19-13 and was 19.9 on 03-30-13.   Review of systems as above, also: No fever or symptoms of infection. No pain LLE or buttocks or low back. No swelling LE. No problems with PAC. No respiratory or cardiac symptoms. No HA or other neurologic symptoms. No bleeding or bruising. No significant sensory neuropathy hands or feet. Remainder of 10 point Review of Systems negative/ unchanged.  Objective:  Vital signs in last 24 hours:  BP 111/68  Pulse 80  Temp(Src) 98 F (36.7 C) (  Oral)  Resp 18  Ht 5\' 6"  (1.676 m)  Wt 161 lb 11.2 oz (73.347 kg)  BMI 26.11 kg/m2  SpO2 100%  LMP 10/25/2012 Weight is up 1 lb. Alert, oriented and appropriate. Ambulatory without difficulty.  Alopecia  HEENT:PERRL, sclerae not icteric. Oral mucosa moist without lesions, posterior pharynx clear.  Neck supple. No JVD or thyroid mass. Lymphatics:no cervical,suraclavicular, axillary or inguinal  adenopathy Resp: clear to auscultation bilaterally and normal percussion bilaterally Cardio: regular rate and rhythm. No gallop. GI: soft, nontender, not distended, no mass or organomegaly. Normally active bowel sounds. Surgical incision not remarkable. Musculoskeletal/ Extremities: without pitting edema, cords, tenderness. Back not tender. No pain with moving LLE against resistance. Neuro: no peripheral neuropathy. Left hip flexors 2+ c/w 3+ right. Left hip abduction 2+ , c/w 3+ on right. Skin without rash, ecchymosis, petechiae Portacath-without erythema or tenderness  Lab Results:  Results for orders placed in visit on 04/26/13  CBC WITH DIFFERENTIAL      Result Value Range   WBC 2.2 (*) 3.9 - 10.3 10e3/uL   NEUT# 1.0 (*) 1.5 - 6.5 10e3/uL   HGB 10.4 (*) 11.6 - 15.9 g/dL   HCT 86.5 (*) 78.4 - 69.6 %   Platelets 180  145 - 400 10e3/uL   MCV 105.9 (*) 79.5 - 101.0 fL   MCH 36.0 (*) 25.1 - 34.0 pg   MCHC 34.0  31.5 - 36.0 g/dL   RBC 2.95 (*) 2.84 - 1.32 10e6/uL   RDW 15.0 (*) 11.2 - 14.5 %   lymph# 0.8 (*) 0.9 - 3.3 10e3/uL   MONO# 0.3  0.1 - 0.9 10e3/uL   Eosinophils Absolute 0.0  0.0 - 0.5 10e3/uL   Basophils Absolute 0.0  0.0 - 0.1 10e3/uL   NEUT% 46.0  38.4 - 76.8 %   LYMPH% 36.6  14.0 - 49.7 %   MONO% 15.6 (*) 0.0 - 14.0 %   EOS% 1.1  0.0 - 7.0 %   BASO% 0.7  0.0 - 2.0 %     Studies/Results: CT AP 12-30-12 had apparent seroma or lymphocele left adnexal area 4.1 x 3.4 cm. Next imaging is planned shortly prior to Dr Wanita Chamberlain visit in early Dec.  Medications: I have reviewed the patient's current medications. Will add Hemocyte or ferrous fumarate one daily, which generally does not cause GI problems as much as ferrous sulfate. She has not had flu vaccine.  DISCUSSION: Have recommended referral to outpatient PT for evaluation and treatment/ exercise recommendations for LLE weakness, and patient is in agreement with this. This is not typical for taxol  neuropathy.  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 With ANC just 1.0 today, will give taxol only for day 1 cycle 6 on 04-27-14; if ANC >=1.5 by day 8 will give carboplatin (dose reduced as cycle 4)  with taxol that day; parameters for day 8 entered under each chemo drug in orders.  She will see Dr Yolande Jolly 06-22-13 and needs CT AP prior to that visit 2.continuing prophylactic acyclovir for duration of chemo due to premedication steroids and previous acute zoster  3. Left LE weakness causing her to fall x2 as described. Will refer to outpatient PT. If symptoms worsen/ continue this severe may need CT sooner than planned early  Dec.  6.hypothyroid on replacement  3.lactose and gluten intolerance  4.PAC in, placed at [facility].  5.oral candida previously  6.intolerance to zofran with HA, severe restless legs with benadryl, intolerance to compazine, dislikes ativan. Using primarily phenergan for nausea.  7.no flu vaccine yet 8. Anemia related to chemo and multiple blood draws: add oral ferrous fumarate if tolerates.  Patient is in agreement with plan above.  Saumya Hukill P, MD   04/26/2013, 2:30 PM

## 2013-04-27 ENCOUNTER — Telehealth: Payer: Self-pay | Admitting: *Deleted

## 2013-04-27 ENCOUNTER — Encounter (INDEPENDENT_AMBULATORY_CARE_PROVIDER_SITE_OTHER): Payer: Self-pay

## 2013-04-27 ENCOUNTER — Other Ambulatory Visit: Payer: Self-pay | Admitting: *Deleted

## 2013-04-27 ENCOUNTER — Other Ambulatory Visit: Payer: 59 | Admitting: Lab

## 2013-04-27 ENCOUNTER — Ambulatory Visit (HOSPITAL_BASED_OUTPATIENT_CLINIC_OR_DEPARTMENT_OTHER): Payer: 59

## 2013-04-27 ENCOUNTER — Other Ambulatory Visit: Payer: Self-pay | Admitting: Oncology

## 2013-04-27 VITALS — BP 106/67 | HR 93 | Temp 96.8°F

## 2013-04-27 DIAGNOSIS — M6281 Muscle weakness (generalized): Secondary | ICD-10-CM

## 2013-04-27 DIAGNOSIS — C561 Malignant neoplasm of right ovary: Secondary | ICD-10-CM

## 2013-04-27 DIAGNOSIS — C569 Malignant neoplasm of unspecified ovary: Secondary | ICD-10-CM

## 2013-04-27 DIAGNOSIS — Z5111 Encounter for antineoplastic chemotherapy: Secondary | ICD-10-CM

## 2013-04-27 MED ORDER — SODIUM CHLORIDE 0.9 % IJ SOLN
10.0000 mL | INTRAMUSCULAR | Status: DC | PRN
  Administered 2013-04-27: 10 mL
  Filled 2013-04-27: qty 10

## 2013-04-27 MED ORDER — DEXAMETHASONE SODIUM PHOSPHATE 20 MG/5ML IJ SOLN
INTRAMUSCULAR | Status: AC
  Filled 2013-04-27: qty 5

## 2013-04-27 MED ORDER — FAMOTIDINE IN NACL 20-0.9 MG/50ML-% IV SOLN
20.0000 mg | Freq: Once | INTRAVENOUS | Status: AC
  Administered 2013-04-27: 20 mg via INTRAVENOUS

## 2013-04-27 MED ORDER — FAMOTIDINE IN NACL 20-0.9 MG/50ML-% IV SOLN
INTRAVENOUS | Status: AC
  Filled 2013-04-27: qty 50

## 2013-04-27 MED ORDER — DEXAMETHASONE SODIUM PHOSPHATE 20 MG/5ML IJ SOLN
20.0000 mg | Freq: Once | INTRAMUSCULAR | Status: AC
  Administered 2013-04-27: 20 mg via INTRAVENOUS

## 2013-04-27 MED ORDER — PACLITAXEL CHEMO INJECTION 300 MG/50ML
80.0000 mg/m2 | Freq: Once | INTRAVENOUS | Status: AC
  Administered 2013-04-27: 138 mg via INTRAVENOUS
  Filled 2013-04-27: qty 23

## 2013-04-27 MED ORDER — LORAZEPAM 1 MG PO TABS
1.0000 mg | ORAL_TABLET | Freq: Once | ORAL | Status: AC | PRN
  Administered 2013-04-27: 1 mg via ORAL

## 2013-04-27 MED ORDER — DIPHENHYDRAMINE HCL 25 MG PO TABS
12.5000 mg | ORAL_TABLET | Freq: Once | ORAL | Status: AC
  Administered 2013-04-27: 12.5 mg via ORAL
  Filled 2013-04-27: qty 0.5

## 2013-04-27 MED ORDER — SODIUM CHLORIDE 0.9 % IV SOLN
Freq: Once | INTRAVENOUS | Status: AC
  Administered 2013-04-27: 15:00:00 via INTRAVENOUS

## 2013-04-27 MED ORDER — PROMETHAZINE HCL 25 MG/ML IJ SOLN
25.0000 mg | Freq: Once | INTRAMUSCULAR | Status: AC
  Administered 2013-04-27: 25 mg via INTRAVENOUS
  Filled 2013-04-27: qty 1

## 2013-04-27 MED ORDER — HEPARIN SOD (PORK) LOCK FLUSH 100 UNIT/ML IV SOLN
500.0000 [IU] | Freq: Once | INTRAVENOUS | Status: AC | PRN
  Administered 2013-04-27: 500 [IU]
  Filled 2013-04-27: qty 5

## 2013-04-27 MED ORDER — LORAZEPAM 1 MG PO TABS
ORAL_TABLET | ORAL | Status: AC
  Filled 2013-04-27: qty 1

## 2013-04-27 NOTE — Telephone Encounter (Signed)
i sent LL an emailed due to [place] has check all the locations and even though i can see the referral, however she can locate it....td

## 2013-04-27 NOTE — Patient Instructions (Signed)
[facility] Discharge Instructions for Patients Receiving Chemotherapy  Today you received the following chemotherapy agents: Taxol.  To help prevent nausea and vomiting after your treatment, we encourage you to take your nausea medication as prescribed.   If you develop nausea and vomiting that is not controlled by your nausea medication, call the clinic.   BELOW ARE SYMPTOMS THAT SHOULD BE REPORTED IMMEDIATELY:  *FEVER GREATER THAN 100.5 F  *CHILLS WITH OR WITHOUT FEVER  NAUSEA AND VOMITING THAT IS NOT CONTROLLED WITH YOUR NAUSEA MEDICATION  *UNUSUAL SHORTNESS OF BREATH  *UNUSUAL BRUISING OR BLEEDING  TENDERNESS IN MOUTH AND THROAT WITH OR WITHOUT PRESENCE OF ULCERS  *URINARY PROBLEMS  *BOWEL PROBLEMS  UNUSUAL RASH Items with * indicate a potential emergency and should be followed up as soon as possible.  Feel free to call the clinic you have any questions or concerns. The clinic phone number is [phone].    

## 2013-04-27 NOTE — Telephone Encounter (Signed)
Per staff message and POF I have scheduled appts.  JMW  

## 2013-04-27 NOTE — Telephone Encounter (Signed)
Lm gv appt for 05/10/13 w/ labs @ 8am and ov @ 8:30am....td

## 2013-04-28 ENCOUNTER — Other Ambulatory Visit: Payer: Self-pay | Admitting: Certified Registered Nurse Anesthetist

## 2013-04-28 ENCOUNTER — Ambulatory Visit (HOSPITAL_BASED_OUTPATIENT_CLINIC_OR_DEPARTMENT_OTHER): Payer: 59

## 2013-04-28 ENCOUNTER — Other Ambulatory Visit: Payer: Self-pay | Admitting: Oncology

## 2013-04-28 VITALS — BP 110/62 | HR 72 | Temp 97.9°F

## 2013-04-28 DIAGNOSIS — Z5189 Encounter for other specified aftercare: Secondary | ICD-10-CM

## 2013-04-28 DIAGNOSIS — R29898 Other symptoms and signs involving the musculoskeletal system: Secondary | ICD-10-CM

## 2013-04-28 DIAGNOSIS — C561 Malignant neoplasm of right ovary: Secondary | ICD-10-CM

## 2013-04-28 DIAGNOSIS — C569 Malignant neoplasm of unspecified ovary: Secondary | ICD-10-CM

## 2013-04-28 MED ORDER — FILGRASTIM 300 MCG/0.5ML IJ SOLN
300.0000 ug | Freq: Once | INTRAMUSCULAR | Status: AC
  Administered 2013-04-28: 300 ug via SUBCUTANEOUS
  Filled 2013-04-28: qty 0.5

## 2013-04-29 ENCOUNTER — Ambulatory Visit (HOSPITAL_BASED_OUTPATIENT_CLINIC_OR_DEPARTMENT_OTHER): Payer: 59

## 2013-04-29 VITALS — BP 102/54 | HR 81 | Temp 98.3°F

## 2013-04-29 DIAGNOSIS — C561 Malignant neoplasm of right ovary: Secondary | ICD-10-CM

## 2013-04-29 DIAGNOSIS — C569 Malignant neoplasm of unspecified ovary: Secondary | ICD-10-CM

## 2013-04-29 DIAGNOSIS — Z5189 Encounter for other specified aftercare: Secondary | ICD-10-CM

## 2013-04-29 DIAGNOSIS — D6481 Anemia due to antineoplastic chemotherapy: Secondary | ICD-10-CM

## 2013-04-29 MED ORDER — FILGRASTIM 300 MCG/0.5ML IJ SOLN
300.0000 ug | Freq: Once | INTRAMUSCULAR | Status: AC
  Administered 2013-04-29: 300 ug via SUBCUTANEOUS
  Filled 2013-04-29: qty 0.5

## 2013-05-04 ENCOUNTER — Other Ambulatory Visit: Payer: Self-pay | Admitting: Oncology

## 2013-05-04 ENCOUNTER — Ambulatory Visit (HOSPITAL_BASED_OUTPATIENT_CLINIC_OR_DEPARTMENT_OTHER): Payer: 59

## 2013-05-04 ENCOUNTER — Other Ambulatory Visit (HOSPITAL_BASED_OUTPATIENT_CLINIC_OR_DEPARTMENT_OTHER): Payer: 59 | Admitting: Lab

## 2013-05-04 VITALS — BP 103/59 | HR 95 | Temp 98.1°F | Resp 18

## 2013-05-04 DIAGNOSIS — C561 Malignant neoplasm of right ovary: Secondary | ICD-10-CM

## 2013-05-04 DIAGNOSIS — C569 Malignant neoplasm of unspecified ovary: Secondary | ICD-10-CM

## 2013-05-04 DIAGNOSIS — Z5111 Encounter for antineoplastic chemotherapy: Secondary | ICD-10-CM

## 2013-05-04 LAB — CBC WITH DIFFERENTIAL/PLATELET
Basophils Absolute: 0 10*3/uL (ref 0.0–0.1)
Eosinophils Absolute: 0 10*3/uL (ref 0.0–0.5)
HCT: 28.8 % — ABNORMAL LOW (ref 34.8–46.6)
HGB: 10 g/dL — ABNORMAL LOW (ref 11.6–15.9)
MCH: 36.6 pg — ABNORMAL HIGH (ref 25.1–34.0)
MCV: 105.8 fL — ABNORMAL HIGH (ref 79.5–101.0)
MONO#: 0.1 10*3/uL (ref 0.1–0.9)
NEUT#: 3.1 10*3/uL (ref 1.5–6.5)
NEUT%: 84.6 % — ABNORMAL HIGH (ref 38.4–76.8)
RDW: 14.6 % — ABNORMAL HIGH (ref 11.2–14.5)
lymph#: 0.4 10*3/uL — ABNORMAL LOW (ref 0.9–3.3)

## 2013-05-04 MED ORDER — FAMOTIDINE IN NACL 20-0.9 MG/50ML-% IV SOLN
INTRAVENOUS | Status: AC
  Filled 2013-05-04: qty 50

## 2013-05-04 MED ORDER — SODIUM CHLORIDE 0.9 % IV SOLN
320.0000 mg | Freq: Once | INTRAVENOUS | Status: AC
  Administered 2013-05-04: 320 mg via INTRAVENOUS
  Filled 2013-05-04: qty 32

## 2013-05-04 MED ORDER — DIPHENHYDRAMINE HCL 12.5 MG/5ML PO ELIX
12.5000 mg | ORAL_SOLUTION | Freq: Once | ORAL | Status: AC
  Administered 2013-05-04: 12.5 mg via ORAL
  Filled 2013-05-04: qty 5

## 2013-05-04 MED ORDER — SODIUM CHLORIDE 0.9 % IV SOLN
Freq: Once | INTRAVENOUS | Status: AC
  Administered 2013-05-04: 15:00:00 via INTRAVENOUS

## 2013-05-04 MED ORDER — LORAZEPAM 1 MG PO TABS
1.0000 mg | ORAL_TABLET | Freq: Once | ORAL | Status: AC | PRN
  Administered 2013-05-04: 1 mg via ORAL

## 2013-05-04 MED ORDER — PROMETHAZINE HCL 25 MG/ML IJ SOLN
25.0000 mg | Freq: Once | INTRAMUSCULAR | Status: AC
  Administered 2013-05-04: 25 mg via INTRAVENOUS
  Filled 2013-05-04: qty 1

## 2013-05-04 MED ORDER — SODIUM CHLORIDE 0.9 % IJ SOLN
10.0000 mL | INTRAMUSCULAR | Status: DC | PRN
  Administered 2013-05-04: 10 mL
  Filled 2013-05-04: qty 10

## 2013-05-04 MED ORDER — FAMOTIDINE IN NACL 20-0.9 MG/50ML-% IV SOLN
20.0000 mg | Freq: Once | INTRAVENOUS | Status: AC
  Administered 2013-05-04: 20 mg via INTRAVENOUS

## 2013-05-04 MED ORDER — DEXAMETHASONE SODIUM PHOSPHATE 20 MG/5ML IJ SOLN
20.0000 mg | Freq: Once | INTRAMUSCULAR | Status: AC
  Administered 2013-05-04: 20 mg via INTRAVENOUS

## 2013-05-04 MED ORDER — DIPHENHYDRAMINE HCL 25 MG PO TABS: 12.5000 mg | ORAL_TABLET | Freq: Once | ORAL | Status: DC

## 2013-05-04 MED ORDER — LORAZEPAM 1 MG PO TABS
ORAL_TABLET | ORAL | Status: AC
  Filled 2013-05-04: qty 1

## 2013-05-04 MED ORDER — DIPHENHYDRAMINE HCL 12.5 MG/5ML PO SYRP: 12.5000 mg | ORAL_SOLUTION | Freq: Once | ORAL | Status: DC

## 2013-05-04 MED ORDER — DEXAMETHASONE SODIUM PHOSPHATE 20 MG/5ML IJ SOLN
INTRAMUSCULAR | Status: AC
  Filled 2013-05-04: qty 5

## 2013-05-04 MED ORDER — PACLITAXEL CHEMO INJECTION 300 MG/50ML
80.0000 mg/m2 | Freq: Once | INTRAVENOUS | Status: AC
  Administered 2013-05-04: 138 mg via INTRAVENOUS
  Filled 2013-05-04: qty 23

## 2013-05-04 MED ORDER — HEPARIN SOD (PORK) LOCK FLUSH 100 UNIT/ML IV SOLN
500.0000 [IU] | Freq: Once | INTRAVENOUS | Status: AC | PRN
  Administered 2013-05-04: 500 [IU]
  Filled 2013-05-04: qty 5

## 2013-05-04 MED ORDER — LORAZEPAM 2 MG/ML IJ SOLN
INTRAMUSCULAR | Status: AC
  Filled 2013-05-04: qty 1

## 2013-05-05 ENCOUNTER — Ambulatory Visit (HOSPITAL_BASED_OUTPATIENT_CLINIC_OR_DEPARTMENT_OTHER): Payer: 59

## 2013-05-05 VITALS — BP 107/51 | HR 78 | Temp 97.6°F

## 2013-05-05 DIAGNOSIS — C561 Malignant neoplasm of right ovary: Secondary | ICD-10-CM

## 2013-05-05 DIAGNOSIS — C569 Malignant neoplasm of unspecified ovary: Secondary | ICD-10-CM

## 2013-05-05 DIAGNOSIS — Z5189 Encounter for other specified aftercare: Secondary | ICD-10-CM

## 2013-05-05 MED ORDER — FILGRASTIM 300 MCG/0.5ML IJ SOLN
300.0000 ug | Freq: Once | INTRAMUSCULAR | Status: AC
  Administered 2013-05-05: 300 ug via SUBCUTANEOUS
  Filled 2013-05-05: qty 0.5

## 2013-05-06 ENCOUNTER — Ambulatory Visit (HOSPITAL_BASED_OUTPATIENT_CLINIC_OR_DEPARTMENT_OTHER): Payer: 59

## 2013-05-06 VITALS — BP 94/52 | HR 82 | Temp 97.8°F

## 2013-05-06 DIAGNOSIS — C561 Malignant neoplasm of right ovary: Secondary | ICD-10-CM

## 2013-05-06 DIAGNOSIS — Z5189 Encounter for other specified aftercare: Secondary | ICD-10-CM

## 2013-05-06 DIAGNOSIS — C569 Malignant neoplasm of unspecified ovary: Secondary | ICD-10-CM

## 2013-05-06 MED ORDER — FILGRASTIM 300 MCG/0.5ML IJ SOLN
300.0000 ug | Freq: Once | INTRAMUSCULAR | Status: AC
  Administered 2013-05-06: 300 ug via SUBCUTANEOUS
  Filled 2013-05-06: qty 0.5

## 2013-05-09 ENCOUNTER — Other Ambulatory Visit: Payer: Self-pay | Admitting: Oncology

## 2013-05-10 ENCOUNTER — Ambulatory Visit (HOSPITAL_BASED_OUTPATIENT_CLINIC_OR_DEPARTMENT_OTHER): Payer: 59 | Admitting: Oncology

## 2013-05-10 ENCOUNTER — Other Ambulatory Visit (HOSPITAL_BASED_OUTPATIENT_CLINIC_OR_DEPARTMENT_OTHER): Payer: 59 | Admitting: Lab

## 2013-05-10 ENCOUNTER — Telehealth: Payer: Self-pay | Admitting: *Deleted

## 2013-05-10 ENCOUNTER — Encounter: Payer: Self-pay | Admitting: Oncology

## 2013-05-10 VITALS — BP 94/62 | HR 83 | Temp 98.0°F | Resp 20 | Ht 66.0 in | Wt 161.8 lb

## 2013-05-10 DIAGNOSIS — C561 Malignant neoplasm of right ovary: Secondary | ICD-10-CM

## 2013-05-10 DIAGNOSIS — C569 Malignant neoplasm of unspecified ovary: Secondary | ICD-10-CM

## 2013-05-10 DIAGNOSIS — G620 Drug-induced polyneuropathy: Secondary | ICD-10-CM

## 2013-05-10 DIAGNOSIS — D6481 Anemia due to antineoplastic chemotherapy: Secondary | ICD-10-CM

## 2013-05-10 DIAGNOSIS — E039 Hypothyroidism, unspecified: Secondary | ICD-10-CM

## 2013-05-10 LAB — CBC WITH DIFFERENTIAL/PLATELET
BASO%: 0.4 % (ref 0.0–2.0)
LYMPH%: 26.7 % (ref 14.0–49.7)
MCHC: 33.9 g/dL (ref 31.5–36.0)
MCV: 105.1 fL — ABNORMAL HIGH (ref 79.5–101.0)
MONO#: 0.2 10*3/uL (ref 0.1–0.9)
MONO%: 8.5 % (ref 0.0–14.0)
Platelets: 203 10*3/uL (ref 145–400)
RBC: 2.89 10*6/uL — ABNORMAL LOW (ref 3.70–5.45)
RDW: 14.4 % (ref 11.2–14.5)
WBC: 2.7 10*3/uL — ABNORMAL LOW (ref 3.9–10.3)

## 2013-05-10 NOTE — Patient Instructions (Signed)
Physical therapy -- good to set up after chemo completes  Portacath needs to be flushed every 6-8 weeks when not otherwise used

## 2013-05-10 NOTE — Progress Notes (Signed)
OFFICE PROGRESS NOTE   05/10/2013   Physicians:Daniel ClarkePearson; Maurice Small, MD, Ronnald Ramp, Rodolph Bong   INTERVAL HISTORY:   Patient is seen, alone for visit, in continuing attention to adjuvant dose dense taxol carboplatin for IIB grade 3 carcinoma of right ovary, day 15 cycle 6 due 05-11-13. With this #6 cycle she had taxol only on day 1 due to counts, then CBC ok for [place] with taxol on day 8. She has required dose reductions of carbo due to cytopenias. Patient felt "tired and sick" past 2-3 days, tho she was able to keep down fluids adequately. She has difficulty wearing clogs due to neuropathy symptoms distal feet bilaterally, but has no awareness of the neuropathy and no difficulty at all ambulating if she wears tennis shoes. She has numbness "fingernails" which causes some difficulty buttoning, however she was able to sew Halloween costume dragon wings for her child this weekend. None of the neuropathy is painful to her. We have discussed fact that taxol neuropathy can be cumulative and may not reverse entirely, however she does not seem markedly symptomatic as above and she very much wants to complete chemotherapy for the cancer indication, including last planned weekly dose taxol on 05-11-13. She needs to call back to outpatient PT to set up appointment there, due to persistent weakness LLE since surgery and zoster. She has not fallen again. She has PAC.    ONCOLOGIC HISTORY Patient presented with RUQ pain in March 2014, with ED evaluation then including abdominal US not remarkable. She was then found to have pelvic mass, with MRI pelvis in  system 11-11-12 showing centrally necrotic pelvic mass 11.4 x 9.1 x 7.8 cm. CA 125 by Dr Seymour Bars 11-13-2012 was 226.4. She saw Dr Yolande Jolly in [place] on 11-17-12 and went to surgery by him at [facility] on 11-24-12. Surgery was complete debulking including TAH, BSO, omentectomy, right ureterolysis, bilateral pelvic lymphadenectomy and  periaortic lymphadenectomy. There is no mention in the operative note of rupture of the ovarian capsule. 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 Pelvic washings ([id number]) negative. Patient was hospitalized at [facility] x 5 days; per patient and husband, she was transfused 2 units PRBCs at [facility].  She developed acute herpes zoster left  T 11-12 dermatome on 12-12-12, then severe pain left inguinal region and upper lateral left thigh such that start of dose dense adjuvant taxol carboplatin was delayed x2 prior to beginning 01-05-13. She has required neupogen x 1-2 days after each treatment, and carbo dose decreased to AUC =5 with cycle 2 and to AUC=3 with cycle 6 due to thrombocytopenia. Note preoperative CA 125 on 11-13-12 was 226 and was 157 post operatively on 12-08-12; this was down to 29 on 01-19-13, to 19.9 on 03-30-13, and 17.6 on 04-20-13.   Review of systems as above, also: Bowels have moved adequately. No fever or symptoms of infection. No bleeding. No abdominal or pelvic pain. No SOB today. Remainder of 10 point Review of Systems negative.  Objective:  Vital signs in last 24 hours:  BP 94/62  Pulse 83  Temp(Src) 98 F (36.7 C) (Oral)  Resp 20  Ht 5\' 6"  (1.676 m)  Wt 161 lb 12.8 oz (73.392 kg)  BMI 26.13 kg/m2  LMP 10/25/2012 Weight is stable. Alert, oriented and appropriate. Ambulatory without difficulty.  Alopecia  HEENT:PERRL, sclerae not icteric. Oral mucosa moist without lesions, posterior pharynx clear.  Neck supple. No JVD.  Lymphatics:no cervical,suraclavicular, axillary or inguinal adenopathy Resp: clear to auscultation bilaterally and normal percussion bilaterally Cardio: regular rate and rhythm. No gallop. GI: soft, nontender, not distended, no mass or  organomegaly. Normally active bowel sounds. Surgical incision not remarkable. Musculoskeletal/ Extremities: without pitting edema, cords, tenderness. Back not tender. LLE slight weakness unchanged Neuro: peripheral neuropathy as noted. Otherwise nonfocal. Psych as above Skin without rash, ecchymosis, petechiae Portacath-without erythema or tenderness  Lab Results:  Results for orders placed in visit on 05/10/13  CBC WITH DIFFERENTIAL      Result Value Range   WBC 2.7 (*) 3.9 - 10.3 10e3/uL   NEUT# 1.7  1.5 - 6.5 10e3/uL   HGB 10.3 (*) 11.6 - 15.9 g/dL   HCT 16.1 (*) 09.6 - 04.5 %   Platelets 203  145 - 400 10e3/uL   MCV 105.1 (*) 79.5 - 101.0 fL   MCH 35.7 (*) 25.1 - 34.0 pg   MCHC 33.9  31.5 - 36.0 g/dL   RBC 4.09 (*) 8.11 - 9.14 10e6/uL   RDW 14.4  11.2 - 14.5 %   lymph# 0.7 (*) 0.9 - 3.3 10e3/uL   MONO# 0.2  0.1 - 0.9 10e3/uL   Eosinophils Absolute 0.0  0.0 - 0.5 10e3/uL   Basophils Absolute 0.0  0.0 - 0.1 10e3/uL   NEUT% 63.6  38.4 - 76.8 %   LYMPH% 26.7  14.0 - 49.7 %   MONO% 8.5  0.0 - 14.0 %   EOS% 0.8  0.0 - 7.0 %   BASO% 0.4  0.0 - 2.0 %     Studies/Results:  No results found. CT AP scheduled for 06-15-13  Medications: I have reviewed the patient's current medications. She will have neupogen for 2 days after last chemo treatment. 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 She will have day 15 cycle 6 taxol on 05-11-13 with neupogen x 2 days afterwards. I will see her with labs in Nov, then she will see Dr Yolande Jolly 06-22-13 with CT AP 06-15-13. Genetics counseling consultation requested. 2.continuing prophylactic acyclovir for duration of chemo  due to premedication steroids and previous acute zoster  3. Left LE weakness causing her to fall x2 as described.  Referral made to outpatient PT at last visit, and patient plans to contact PT to begin probably soon after this last chemotherapy. 6.hypothyroid on replacement  3.lactose and gluten intolerance  4.PAC in, placed at [facility]. We have discussed keeping this flushed every 6-8 weeks when not used otherwise; she would like this removed when we feel it is no longer needed, understands this may be a couple of months after finishing chemo. 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 6.intolerance to zofran with HA, severe restless legs with benadryl, intolerance to compazine, dislikes ativan. Using primarily phenergan for nausea.  7.no flu vaccine yet: will give this when I see her next in Nov. 8. Anemia related to chemo and multiple blood draws  Barnell Shieh P, MD   05/10/2013, 10:03 AM

## 2013-05-10 NOTE — Telephone Encounter (Signed)
appts made and printed. Pt is aware that LM will call with an appt for genetics. i emailed LM to make an appt...td

## 2013-05-11 ENCOUNTER — Other Ambulatory Visit: Payer: 59 | Admitting: Lab

## 2013-05-11 ENCOUNTER — Ambulatory Visit (HOSPITAL_BASED_OUTPATIENT_CLINIC_OR_DEPARTMENT_OTHER): Payer: 59

## 2013-05-11 VITALS — BP 101/63 | HR 96 | Temp 97.8°F | Resp 20

## 2013-05-11 DIAGNOSIS — C561 Malignant neoplasm of right ovary: Secondary | ICD-10-CM

## 2013-05-11 DIAGNOSIS — Z5111 Encounter for antineoplastic chemotherapy: Secondary | ICD-10-CM

## 2013-05-11 DIAGNOSIS — C569 Malignant neoplasm of unspecified ovary: Secondary | ICD-10-CM

## 2013-05-11 MED ORDER — DIPHENHYDRAMINE HCL 25 MG PO TABS
12.5000 mg | ORAL_TABLET | Freq: Once | ORAL | Status: AC
  Administered 2013-05-11: 12.5 mg via ORAL
  Filled 2013-05-11: qty 0.5

## 2013-05-11 MED ORDER — FAMOTIDINE IN NACL 20-0.9 MG/50ML-% IV SOLN
20.0000 mg | Freq: Once | INTRAVENOUS | Status: AC
  Administered 2013-05-11: 20 mg via INTRAVENOUS

## 2013-05-11 MED ORDER — SODIUM CHLORIDE 0.9 % IJ SOLN
10.0000 mL | INTRAMUSCULAR | Status: DC | PRN
  Administered 2013-05-11: 10 mL
  Filled 2013-05-11: qty 10

## 2013-05-11 MED ORDER — SODIUM CHLORIDE 0.9 % IV SOLN
Freq: Once | INTRAVENOUS | Status: AC
  Administered 2013-05-11: 15:00:00 via INTRAVENOUS

## 2013-05-11 MED ORDER — FAMOTIDINE IN NACL 20-0.9 MG/50ML-% IV SOLN
INTRAVENOUS | Status: AC
  Filled 2013-05-11: qty 50

## 2013-05-11 MED ORDER — DEXAMETHASONE SODIUM PHOSPHATE 20 MG/5ML IJ SOLN
20.0000 mg | Freq: Once | INTRAMUSCULAR | Status: AC
  Administered 2013-05-11: 20 mg via INTRAVENOUS

## 2013-05-11 MED ORDER — DEXAMETHASONE SODIUM PHOSPHATE 20 MG/5ML IJ SOLN
INTRAMUSCULAR | Status: AC
  Filled 2013-05-11: qty 5

## 2013-05-11 MED ORDER — LORAZEPAM 1 MG PO TABS
1.0000 mg | ORAL_TABLET | Freq: Once | ORAL | Status: AC | PRN
  Administered 2013-05-11: 1 mg via ORAL

## 2013-05-11 MED ORDER — PROMETHAZINE HCL 25 MG/ML IJ SOLN
25.0000 mg | Freq: Once | INTRAMUSCULAR | Status: AC
  Administered 2013-05-11: 25 mg via INTRAVENOUS
  Filled 2013-05-11: qty 1

## 2013-05-11 MED ORDER — LORAZEPAM 1 MG PO TABS
ORAL_TABLET | ORAL | Status: AC
  Filled 2013-05-11: qty 1

## 2013-05-11 MED ORDER — PACLITAXEL CHEMO INJECTION 300 MG/50ML
80.0000 mg/m2 | Freq: Once | INTRAVENOUS | Status: AC
  Administered 2013-05-11: 138 mg via INTRAVENOUS
  Filled 2013-05-11: qty 23

## 2013-05-11 MED ORDER — HEPARIN SOD (PORK) LOCK FLUSH 100 UNIT/ML IV SOLN
500.0000 [IU] | Freq: Once | INTRAVENOUS | Status: AC | PRN
  Administered 2013-05-11: 500 [IU]
  Filled 2013-05-11: qty 5

## 2013-05-11 NOTE — Patient Instructions (Signed)
[facility] Discharge Instructions for Patients Receiving Chemotherapy  Today you received the following chemotherapy agents Taxol  To help prevent nausea and vomiting after your treatment, we encourage you to take your nausea medication as needed.   If you develop nausea and vomiting that is not controlled by your nausea medication, call the clinic.   BELOW ARE SYMPTOMS THAT SHOULD BE REPORTED IMMEDIATELY:  *FEVER GREATER THAN 100.5 F  *CHILLS WITH OR WITHOUT FEVER  NAUSEA AND VOMITING THAT IS NOT CONTROLLED WITH YOUR NAUSEA MEDICATION  *UNUSUAL SHORTNESS OF BREATH  *UNUSUAL BRUISING OR BLEEDING  TENDERNESS IN MOUTH AND THROAT WITH OR WITHOUT PRESENCE OF ULCERS  *URINARY PROBLEMS  *BOWEL PROBLEMS  UNUSUAL RASH Items with * indicate a potential emergency and should be followed up as soon as possible.  Feel free to call the clinic you have any questions or concerns. The clinic phone number is [phone].    

## 2013-05-12 ENCOUNTER — Ambulatory Visit (HOSPITAL_BASED_OUTPATIENT_CLINIC_OR_DEPARTMENT_OTHER): Payer: 59

## 2013-05-12 VITALS — BP 105/61 | HR 78 | Temp 98.1°F

## 2013-05-12 DIAGNOSIS — C569 Malignant neoplasm of unspecified ovary: Secondary | ICD-10-CM

## 2013-05-12 DIAGNOSIS — C561 Malignant neoplasm of right ovary: Secondary | ICD-10-CM

## 2013-05-12 DIAGNOSIS — Z5189 Encounter for other specified aftercare: Secondary | ICD-10-CM

## 2013-05-12 MED ORDER — FILGRASTIM 300 MCG/0.5ML IJ SOLN
300.0000 ug | Freq: Once | INTRAMUSCULAR | Status: AC
  Administered 2013-05-12: 300 ug via SUBCUTANEOUS
  Filled 2013-05-12: qty 0.5

## 2013-05-13 ENCOUNTER — Ambulatory Visit (HOSPITAL_BASED_OUTPATIENT_CLINIC_OR_DEPARTMENT_OTHER): Payer: 59

## 2013-05-13 VITALS — BP 98/51 | HR 78 | Temp 98.2°F

## 2013-05-13 DIAGNOSIS — C561 Malignant neoplasm of right ovary: Secondary | ICD-10-CM

## 2013-05-13 DIAGNOSIS — Z5189 Encounter for other specified aftercare: Secondary | ICD-10-CM

## 2013-05-13 DIAGNOSIS — C569 Malignant neoplasm of unspecified ovary: Secondary | ICD-10-CM

## 2013-05-13 MED ORDER — FILGRASTIM 300 MCG/0.5ML IJ SOLN
300.0000 ug | Freq: Once | INTRAMUSCULAR | Status: AC
  Administered 2013-05-13: 300 ug via SUBCUTANEOUS
  Filled 2013-05-13: qty 0.5

## 2013-05-14 ENCOUNTER — Telehealth: Payer: Self-pay | Admitting: *Deleted

## 2013-05-14 NOTE — Telephone Encounter (Signed)
PT. DID NOT CHECK SPUTUM FOR COLOR. SHE ALSO HAS NASAL DRAINAGE, SORE THROAT, AND IS ACHY. THESE SYMPTOMS STARTED LAST NIGHT. PT. HAS NOT TAKEN HER TEMPERATURE BUT DOES NOT BELIEVE SHE HAS A FEVER. NO SHORTNESS OF BREATH. VERBAL ORDER AND READ BACK TO Candice JOHNSON,PA- PT. NEEDS TO FORCE FLUIDS. SHE MAY TAKE OVER THE COUNTER MEDICATIONS FOR SYMPTOMS BUT TO AVOID MEDICATIONS WHICH CONTAIN ASPIRIN. KEEP A CHECK ON HER TEMPERATURE IF GREATER THAN 100.5 AND/OR COLOR OF SPUTUM IS YELLOW OR GREEN PT. TO CALL THE ON CALL PHYSICIAN. NOTIFIED PT. OF THE ABOVE INSTRUCTIONS. SHE VOICES UNDERSTANDING.

## 2013-05-17 ENCOUNTER — Telehealth: Payer: Self-pay | Admitting: *Deleted

## 2013-05-17 NOTE — Telephone Encounter (Signed)
Pt returned my call and I confirmed 08/19/13 genetics appt w/ pt.

## 2013-05-17 NOTE — Telephone Encounter (Signed)
Left message for pt to return my call so I can schedule a genetic appt.  

## 2013-05-26 ENCOUNTER — Ambulatory Visit: Payer: 59 | Attending: Oncology | Admitting: Physical Therapy

## 2013-05-26 DIAGNOSIS — R29898 Other symptoms and signs involving the musculoskeletal system: Secondary | ICD-10-CM | POA: Insufficient documentation

## 2013-05-26 DIAGNOSIS — IMO0001 Reserved for inherently not codable concepts without codable children: Secondary | ICD-10-CM | POA: Insufficient documentation

## 2013-05-29 ENCOUNTER — Other Ambulatory Visit: Payer: Self-pay | Admitting: Oncology

## 2013-05-29 DIAGNOSIS — C561 Malignant neoplasm of right ovary: Secondary | ICD-10-CM

## 2013-05-31 ENCOUNTER — Encounter: Payer: Self-pay | Admitting: Oncology

## 2013-05-31 ENCOUNTER — Other Ambulatory Visit (HOSPITAL_BASED_OUTPATIENT_CLINIC_OR_DEPARTMENT_OTHER): Payer: 59 | Admitting: Lab

## 2013-05-31 ENCOUNTER — Ambulatory Visit: Payer: 59

## 2013-05-31 ENCOUNTER — Telehealth: Payer: Self-pay | Admitting: *Deleted

## 2013-05-31 ENCOUNTER — Ambulatory Visit (HOSPITAL_BASED_OUTPATIENT_CLINIC_OR_DEPARTMENT_OTHER): Payer: 59 | Admitting: Oncology

## 2013-05-31 ENCOUNTER — Telehealth: Payer: Self-pay

## 2013-05-31 ENCOUNTER — Encounter (INDEPENDENT_AMBULATORY_CARE_PROVIDER_SITE_OTHER): Payer: Self-pay

## 2013-05-31 VITALS — BP 104/45 | HR 81 | Temp 98.4°F | Resp 18 | Ht 66.0 in | Wt 165.0 lb

## 2013-05-31 DIAGNOSIS — G609 Hereditary and idiopathic neuropathy, unspecified: Secondary | ICD-10-CM

## 2013-05-31 DIAGNOSIS — D6481 Anemia due to antineoplastic chemotherapy: Secondary | ICD-10-CM

## 2013-05-31 DIAGNOSIS — C569 Malignant neoplasm of unspecified ovary: Secondary | ICD-10-CM

## 2013-05-31 DIAGNOSIS — T451X5A Adverse effect of antineoplastic and immunosuppressive drugs, initial encounter: Secondary | ICD-10-CM

## 2013-05-31 DIAGNOSIS — C561 Malignant neoplasm of right ovary: Secondary | ICD-10-CM

## 2013-05-31 DIAGNOSIS — B029 Zoster without complications: Secondary | ICD-10-CM

## 2013-05-31 DIAGNOSIS — Z23 Encounter for immunization: Secondary | ICD-10-CM

## 2013-05-31 LAB — CBC WITH DIFFERENTIAL/PLATELET
BASO%: 0.9 % (ref 0.0–2.0)
Basophils Absolute: 0 10*3/uL (ref 0.0–0.1)
EOS%: 2.7 % (ref 0.0–7.0)
Eosinophils Absolute: 0.1 10*3/uL (ref 0.0–0.5)
HCT: 30.1 % — ABNORMAL LOW (ref 34.8–46.6)
HGB: 10.2 g/dL — ABNORMAL LOW (ref 11.6–15.9)
LYMPH%: 23.5 % (ref 14.0–49.7)
MCH: 35.6 pg — ABNORMAL HIGH (ref 25.1–34.0)
MCHC: 33.7 g/dL (ref 31.5–36.0)
MCV: 105.7 fL — ABNORMAL HIGH (ref 79.5–101.0)
MONO#: 0.4 10*3/uL (ref 0.1–0.9)
MONO%: 11.5 % (ref 0.0–14.0)
NEUT#: 2.1 10*3/uL (ref 1.5–6.5)
NEUT%: 61.4 % (ref 38.4–76.8)
Platelets: 161 10*3/uL (ref 145–400)
RBC: 2.85 10*6/uL — ABNORMAL LOW (ref 3.70–5.45)
RDW: 13.7 % (ref 11.2–14.5)
WBC: 3.4 10*3/uL — ABNORMAL LOW (ref 3.9–10.3)
lymph#: 0.8 10*3/uL — ABNORMAL LOW (ref 0.9–3.3)

## 2013-05-31 MED ORDER — INFLUENZA VAC SPLIT QUAD 0.5 ML IM SUSP
0.5000 mL | INTRAMUSCULAR | Status: AC
  Administered 2013-05-31: 0.5 mL via INTRAMUSCULAR
  Filled 2013-05-31: qty 0.5

## 2013-05-31 NOTE — Telephone Encounter (Signed)
Faxed signed orders dated 05-31-13 for evaluation and treatment paln to [facility].   Sent a copy to be scanned into patient's EMR.

## 2013-05-31 NOTE — Telephone Encounter (Signed)
appts made and printed...td 

## 2013-05-31 NOTE — Patient Instructions (Signed)
Flu vaccine given today.  Let Dr Precious Reel RN know if they do NOT use your PAC for the CT, in which case it will need to be flushed earlier (every 6-8 weeks when not otherwise used).  Call if problems prior to next scheduled visit.   [phone]

## 2013-05-31 NOTE — Progress Notes (Signed)
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 She required gCSF support as well as some dose reductions for counts, and had difficulty with fatigue and some peripheral neuropathy.   She has PAC, this placed at [facility], should be used and flushed with CT 06-15-13.  Patient contracted viral type respiratory illness from 47 yo a couple of days after last chemo, with 24 hours of fever as high as 102 with chills, respiratory symptoms with cough, no GI symptoms. She spoke to this office and was put on tamiflu. All of those symptoms have resolved, tho minimal NP cough occasionally now.  Otherwise the numbness in feet is improved, just involving balls of feet now and wearing clogs easily at office today. Finger tips also still slightly numb, with distal fingernails discolored and slightly separating. Appetite is better, bowels moving normally, no new or different neurologic symptoms. She felt achy in muscles "all over" this weekend, resolved now.  She has had evaluation by outpatient PT for weakness in LLE, this since surgery/ zoster; they plan weekly therapy at least for next 4 weeks. She has had no other falls related to LLE weakness, but tries to avoid stairs entirely.  She is for CT AP on 06-15-13 prior to visit with Dr Yolande Jolly on 06-22-13. We have discussed oral and IV contrast using PAC for the CT.Pre op imaging in [facility] was MRI pelvis 11-11-12 and abdominal US in 09-2012.   ONCOLOGIC HISTORY Patient presented with RUQ pain in March 2014, with ED evaluation then including abdominal US not remarkable. She was then found to have pelvic mass, with MRI pelvis in [place] system 11-11-12 showing centrally  necrotic pelvic mass 11.4 x 9.1 x 7.8 cm. CA 125 by Dr Seymour Bars 11-13-2012 was 226.4. She saw Dr Yolande Jolly in [place] on 11-17-12 and went to surgery by him at [facility] on 11-24-12. Surgery was complete debulking including TAH, BSO, omentectomy, right ureterolysis, bilateral pelvic lymphadenectomy and periaortic lymphadenectomy. There is no mention in the operative note of rupture of the ovarian capsule. 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 Pelvic washings ([id number]) negative. Patient was hospitalized at [facility] x 5 days; per patient and husband, she was transfused 2 units PRBCs at [facility]. She developed acute herpes zoster left T 11-12 dermatome on 12-12-12, then severe pain left inguinal region and upper lateral left thigh such that start of dose dense adjuvant taxol carboplatin was delayed x2 prior to beginning 01-05-13. She has required neupogen x 1-2 days after each treatment, and carbo dose decreased to AUC =5 with cycle 2 and to AUC=3 with cycle 6 due to thrombocytopenia.  Note preoperative CA 125 on 11-13-12 was 226 and was 157 post operatively on 12-08-12; this was down to 29 on 01-19-13, to 19.9 on 03-30-13, and 17.6 on 04-20-13.   Review of systems as above, also: No new or different pain. Bowels moving adequately. No bleeding. Tolerates "occasional" po iron Remainder of 10 point Review of Systems negative.  Objective:  Vital signs in last 24 hours:  BP 104/45  Pulse  81  Temp(Src) 98.4 F (36.9 C) (Oral)  Resp 18  Ht 5\' 6"  (1.676 m)  Wt 165 lb (74.844 kg)  BMI 26.64 kg/m2  SpO2 99%  LMP 10/25/2012 weight is up 4 lbs.  Alert, oriented and appropriate. Ambulatory without assistance.  Alopecia  HEENT:PERRL, sclerae not icteric. Oral mucosa moist without lesions, posterior pharynx  clear.  Neck supple. No JVD.  Lymphatics:no cervical,suraclavicular, axillary or inguinal adenopathy Resp: clear to auscultation bilaterally and normal percussion bilaterally Cardio: regular rate and rhythm. No gallop. GI: soft, nontender, not distended, no mass or organomegaly. Normally active bowel sounds. Surgical incision not remarkable. Musculoskeletal/ Extremities: without pitting edema, cords, tenderness Neuro: peripheral neuropathy fingertips and distal feet. Weakness LLE proximally Skin without rash, ecchymosis, petechiae   Lab Results:  Results for orders placed in visit on 05/31/13  CBC WITH DIFFERENTIAL      Result Value Range   WBC 3.4 (*) 3.9 - 10.3 10e3/uL   NEUT# 2.1  1.5 - 6.5 10e3/uL   HGB 10.2 (*) 11.6 - 15.9 g/dL   HCT 81.1 (*) 91.4 - 78.2 %   Platelets 161  145 - 400 10e3/uL   MCV 105.7 (*) 79.5 - 101.0 fL   MCH 35.6 (*) 25.1 - 34.0 pg   MCHC 33.7  31.5 - 36.0 g/dL   RBC 9.56 (*) 2.13 - 0.86 10e6/uL   RDW 13.7  11.2 - 14.5 %   lymph# 0.8 (*) 0.9 - 3.3 10e3/uL   MONO# 0.4  0.1 - 0.9 10e3/uL   Eosinophils Absolute 0.1  0.0 - 0.5 10e3/uL   Basophils Absolute 0.0  0.0 - 0.1 10e3/uL   NEUT% 61.4  38.4 - 76.8 %   LYMPH% 23.5  14.0 - 49.7 %   MONO% 11.5  0.0 - 14.0 %   EOS% 2.7  0.0 - 7.0 %   BASO% 0.9  0.0 - 2.0 %    CA 125 04-20-13 17.6 CMET 04-20-13 normal with exception of glucose 247 Studies/Results:  No results found.  Medications: I have reviewed the patient's current medications. 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 She will have day 15 cycle 6 taxol on 05-11-13 with neupogen x 2 days afterwards. I will see her with labs in Nov, then she will see  Dr Yolande Jolly 06-22-13 with CT AP 06-15-13. Genetics counseling consultation requested.  2.herpes zoster just prior to start of chemotherapy. Patient remained on acyclovir prophylactically thru treatment  3. Left LE weakness since surgery and zoster, causing her to fall x2.  Outpatient PT just started 6.hypothyroid on replacement  3.lactose and gluten intolerance  4.PAC in, placed at [facility]. We have discussed keeping this flushed every 6-8 weeks when not used otherwise; she would like this removed when we feel it is no longer needed, understands this may be a couple of months after finishing chemo.  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 Continue massage and exercise 6.Intolerance to zofran with HA, severe restless legs with benadryl, intolerance to compazine, dislikes ativan. Using primarily phenergan for nausea.  7 flu vaccine given today  8. Anemia related to chemo and multiple blood draws 9.Recent viral illness treated with tamiflu, resolved  I will see  her in January  coordinating with PAC flush, or sooner if needed. WIll follow up chemistries, ca125 and blood counts also with that visit. Patient is in agreement with plan and had questions answered to her satisfaction.     LIVESAY,LENNIS P, MD   05/31/2013, 9:01 AM

## 2013-06-02 ENCOUNTER — Ambulatory Visit: Payer: 59 | Admitting: Physical Therapy

## 2013-06-08 ENCOUNTER — Ambulatory Visit: Payer: 59 | Admitting: Physical Therapy

## 2013-06-15 ENCOUNTER — Ambulatory Visit (HOSPITAL_COMMUNITY)
Admission: RE | Admit: 2013-06-15 | Discharge: 2013-06-15 | Disposition: A | Discharge status: Home / Self Care | Payer: 59 | Source: Ambulatory Visit | Attending: Oncology | Admitting: Oncology

## 2013-06-15 DIAGNOSIS — Z8543 Personal history of malignant neoplasm of ovary: Secondary | ICD-10-CM | POA: Insufficient documentation

## 2013-06-15 DIAGNOSIS — C561 Malignant neoplasm of right ovary: Secondary | ICD-10-CM

## 2013-06-15 MED ORDER — IOHEXOL 300 MG/ML  SOLN
100.0000 mL | Freq: Once | INTRAMUSCULAR | Status: AC | PRN
  Administered 2013-06-15: 100 mL via INTRAVENOUS

## 2013-06-16 ENCOUNTER — Ambulatory Visit: Payer: 59 | Attending: Oncology | Admitting: Physical Therapy

## 2013-06-16 DIAGNOSIS — IMO0001 Reserved for inherently not codable concepts without codable children: Secondary | ICD-10-CM | POA: Insufficient documentation

## 2013-06-16 DIAGNOSIS — R29898 Other symptoms and signs involving the musculoskeletal system: Secondary | ICD-10-CM | POA: Insufficient documentation

## 2013-06-22 ENCOUNTER — Ambulatory Visit: Payer: 59 | Admitting: Gynecology

## 2013-06-23 ENCOUNTER — Ambulatory Visit: Payer: 59 | Admitting: Physical Therapy

## 2013-07-02 ENCOUNTER — Ambulatory Visit: Payer: 59 | Attending: Gynecology | Admitting: Gynecology

## 2013-07-02 ENCOUNTER — Encounter: Payer: Self-pay | Admitting: Gynecology

## 2013-07-02 VITALS — BP 107/55 | HR 63 | Temp 97.8°F | Resp 18 | Ht 66.54 in | Wt 168.3 lb

## 2013-07-02 DIAGNOSIS — C569 Malignant neoplasm of unspecified ovary: Secondary | ICD-10-CM

## 2013-07-02 DIAGNOSIS — G5790 Unspecified mononeuropathy of unspecified lower limb: Secondary | ICD-10-CM | POA: Insufficient documentation

## 2013-07-02 DIAGNOSIS — Z09 Encounter for follow-up examination after completed treatment for conditions other than malignant neoplasm: Secondary | ICD-10-CM | POA: Insufficient documentation

## 2013-07-02 DIAGNOSIS — Z9221 Personal history of antineoplastic chemotherapy: Secondary | ICD-10-CM | POA: Insufficient documentation

## 2013-07-02 DIAGNOSIS — Z90710 Acquired absence of both cervix and uterus: Secondary | ICD-10-CM | POA: Insufficient documentation

## 2013-07-02 DIAGNOSIS — Z79899 Other long term (current) drug therapy: Secondary | ICD-10-CM | POA: Insufficient documentation

## 2013-07-02 DIAGNOSIS — Z9079 Acquired absence of other genital organ(s): Secondary | ICD-10-CM | POA: Insufficient documentation

## 2013-07-02 DIAGNOSIS — Z8543 Personal history of malignant neoplasm of ovary: Secondary | ICD-10-CM | POA: Insufficient documentation

## 2013-07-02 NOTE — Patient Instructions (Signed)
Return to see me in April. Please again starting taking vitamin B6 100 mg twice a day.

## 2013-07-02 NOTE — Progress Notes (Signed)
Consult Note: Gyn-Onc   Matthias Hughs 47 y.o. female  Chief Complaint  Patient presents with  . Ovarian Cancer    Follow up    Assessment : Stage IIB ovarian cancer clinically free of disease. 2 peripheral neuropathy. 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 Plan:  CT scan was reviewed with the patient and her husband. CA 125 is obtained today. With regard to her neuropathy of aspiration begin taking vitamin B6 100 mg twice a day. It has not improved she will discuss with Dr. Darrold Span other options. She'll continue to work with physical therapy regarding what sounds like a mild femoral nerve compression injury. She will see Dr. Darrold Span in January and I will see her in April.   Interval history: The patient returns today as previously scheduled. She is now completed 6 cycles of dose dense carboplatin and Taxol chemotherapy. Followup CT scan on 06/15/2013 shows no evidence of disease and CA 125 in October 17. She continues to have peripheral neuropathy in her hands and feet. She's also worked with a physical therapist with regard to what sounds like a left femoral nerve compression injury which is getting better with physical therapy.. .. Appetite is good she has no GI or GU symptoms.  Her functional status is much improved and she has returned to work part-time basis.     HPI:  82 her old white married female gravida 4 para 2 seen in consultation request of Dr.Marie-Lyne Seymour Bars regarding management of a newly diagnosed complex pelvic mass. The patient has had several months of lower nominal pain and initially underwent a pelvic ultrasound on April 28 that showed a 10.8 x 8.7 x 9.2 cm heterogeneous pelvic mass. Small fibroids were also noted. The adnexa were poorly seen. Subsequently she underwent a MRI of the pelvis which demonstrated a centrally necrotic pelvic mass which may arise from the posterior uterus or the right adnexa. She had a normal-appearing left ovary moderate ascites and no  evidence of adenopathy. CA 125 is 226 units per mL CEA 1.5 (normal)OVA1 was 9.4 (upper limits of normal for premenopausal patient is 5) Patient underwent exploratory laparotomy, TAH, BSO, omentectomy, pelvic and aortic lymphadenectomy at [facility] on 11/24/2012. She had 11 x 9 x 7 cm right adnexal mass which was densely adherent to the right pelvic sidewall (stage IIB). 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 All other biopsies including pelvic and para-aortic lymphadenectomy were negative.  Postoperatively she was treated with dose dense carboplatin and Taxol. For 6 cycles. CT scan the end of therapy on 06/15/2013 was normal and CA 125 is 17 units per mL. Review of Systems:10 point review of systems is negative except as noted in interval history.   Vitals: Blood pressure 107/55, pulse 63, temperature 97.8 F (36.6 C), resp. rate 18, height 5' 6.54" (1.69 m), weight 168 lb 4.8 oz (76.34 kg), last menstrual period 10/25/2012.  Physical Exam: General : The patient is a healthy woman in no acute distress.  HEENT: normocephalic, extraoccular movements normal; neck is supple without thyromegally  Lynphnodes: Supraclavicular and inguinal nodes not enlarged  Abdomen: Soft, non-tender, no ascites, no organomegally., no hernias, incision is well-healed Pelvic:  EGBUS: Normal female  Vagina: Normal, no lesions , the cuff is healed. Urethra and Bladder: Normal, non-tender  Cervix: Surgically absent   Uterus: I surgically absent   Rectal: normal sphincter ton, confirmed pelvic mass., no blood  Lower extremities: No edema or varicosities. Normal range of motion      Allergies  Allergen Reactions  . Compazine [Prochlorperazine Maleate] Other (See Comments)    Jittery  . Zofran [Ondansetron Hcl] Other (See Comments)    headache  . Sulfa Antibiotics Rash    Past Medical History  Diagnosis Date  . Ectopic pregnancy   . History of chicken pox   . Pelvic mass in female   .  Abdominal pain   . Infertility, female   . Hemorrhoids   . Goiter   . Hypothyroidism   . Dermoid cyst     Past Surgical History  Procedure Laterality Date  . Laparoscopy for ectopic pregnancy      10 years ago    Current Outpatient Prescriptions  Medication Sig Dispense Refill  . acyclovir (ZOVIRAX) 200 MG capsule       . cyclobenzaprine (FLEXERIL) 10 MG tablet Take 1 tablet (10 mg total) by mouth 3 (three) times daily as needed for muscle spasms.  20 tablet  1  . dexamethasone (DECADRON) 4 MG tablet       . HEMOCYTE 324 MG TABS tablet Take 1 tablet (106 mg of iron total) by mouth daily.  30 each  2  . levothyroxine (SYNTHROID, LEVOTHROID) 50 MCG tablet Take 50 mcg by mouth daily.      Marland Kitchen lidocaine-prilocaine (EMLA) cream Apply topically as needed. To port prior to access  30 g  1  . LORazepam (ATIVAN) 1 MG tablet 1/2- 1 tablet by mouth or under tongue every 4-6 hours as needed for nausea. Will make you drowsy  20 tablet  0  . oxyCODONE-acetaminophen (PERCOCET/ROXICET) 5-325 MG per tablet Take 1 tablet by mouth every 6 (six) hours as needed for pain.  15 tablet  0  . promethazine (PHENERGAN) 25 MG suppository Place 1 suppository (25 mg total) rectally every 6 (six) hours as needed for nausea.  12 each  1  . promethazine (PHENERGAN) 25 MG tablet Take 1 tablet (25 mg total) by mouth every 6 (six) hours as needed for nausea.  30 tablet  1  . traMADol (ULTRAM) 50 MG tablet Take 50-100 mg by mouth every 6 (six) hours as needed for pain.       No current facility-administered medications for this visit.    History   Social History  . Marital Status: Married    Spouse Name: N/A    Number of Children: N/A  . Years of Education: N/A   Occupational History  . Not on file.   Social History Main Topics  . Smoking status: Never Smoker   . Smokeless tobacco: Not on file  . Alcohol Use: No  . Drug Use: No  . Sexual Activity: Not on file   Other Topics Concern  . Not on file    Social History Narrative  . No narrative on file    Family History  Problem Relation Age of Onset  . Hypothyroidism Mother   . Heart disease Mother   . Uterine cancer Mother   . Heart disease Father   . Heart attack Father   . Hypothyroidism Sister   . Breast cancer Maternal Aunt   . Diabetes Maternal Grandfather   . Diabetes Paternal Grandmother       Jeannette Corpus, MD 07/02/2013, 1:25 PM

## 2013-08-01 ENCOUNTER — Other Ambulatory Visit: Payer: Self-pay | Admitting: Oncology

## 2013-08-01 DIAGNOSIS — C569 Malignant neoplasm of unspecified ovary: Secondary | ICD-10-CM

## 2013-08-02 ENCOUNTER — Ambulatory Visit (HOSPITAL_BASED_OUTPATIENT_CLINIC_OR_DEPARTMENT_OTHER): Payer: 59 | Admitting: Oncology

## 2013-08-02 ENCOUNTER — Other Ambulatory Visit (HOSPITAL_BASED_OUTPATIENT_CLINIC_OR_DEPARTMENT_OTHER): Payer: 59

## 2013-08-02 ENCOUNTER — Telehealth: Payer: Self-pay | Admitting: Oncology

## 2013-08-02 ENCOUNTER — Encounter: Payer: Self-pay | Admitting: Oncology

## 2013-08-02 ENCOUNTER — Ambulatory Visit (HOSPITAL_BASED_OUTPATIENT_CLINIC_OR_DEPARTMENT_OTHER): Payer: 59

## 2013-08-02 VITALS — BP 94/53 | HR 57 | Temp 97.2°F | Resp 18

## 2013-08-02 VITALS — Ht 66.53 in | Wt 165.7 lb

## 2013-08-02 DIAGNOSIS — T451X5A Adverse effect of antineoplastic and immunosuppressive drugs, initial encounter: Secondary | ICD-10-CM

## 2013-08-02 DIAGNOSIS — C569 Malignant neoplasm of unspecified ovary: Secondary | ICD-10-CM

## 2013-08-02 DIAGNOSIS — D6481 Anemia due to antineoplastic chemotherapy: Secondary | ICD-10-CM

## 2013-08-02 DIAGNOSIS — G620 Drug-induced polyneuropathy: Secondary | ICD-10-CM

## 2013-08-02 LAB — CBC WITH DIFFERENTIAL/PLATELET
BASO%: 0.4 % (ref 0.0–2.0)
BASOS ABS: 0 10*3/uL (ref 0.0–0.1)
EOS%: 4 % (ref 0.0–7.0)
Eosinophils Absolute: 0.2 10*3/uL (ref 0.0–0.5)
HCT: 34.2 % — ABNORMAL LOW (ref 34.8–46.6)
HEMOGLOBIN: 11.3 g/dL — AB (ref 11.6–15.9)
LYMPH%: 24.6 % (ref 14.0–49.7)
MCH: 31.7 pg (ref 25.1–34.0)
MCHC: 33 g/dL (ref 31.5–36.0)
MCV: 95.8 fL (ref 79.5–101.0)
MONO#: 0.4 10*3/uL (ref 0.1–0.9)
MONO%: 8.4 % (ref 0.0–14.0)
NEUT#: 2.8 10*3/uL (ref 1.5–6.5)
NEUT%: 62.6 % (ref 38.4–76.8)
PLATELETS: 136 10*3/uL — AB (ref 145–400)
RBC: 3.57 10*6/uL — ABNORMAL LOW (ref 3.70–5.45)
RDW: 11.3 % (ref 11.2–14.5)
WBC: 4.5 10*3/uL (ref 3.9–10.3)
lymph#: 1.1 10*3/uL (ref 0.9–3.3)
nRBC: 0 % (ref 0–0)

## 2013-08-02 LAB — COMPREHENSIVE METABOLIC PANEL (CC13)
ALBUMIN: 4 g/dL (ref 3.5–5.0)
ALK PHOS: 48 U/L (ref 40–150)
ALT: 11 U/L (ref 0–55)
AST: 17 U/L (ref 5–34)
Anion Gap: 11 mEq/L (ref 3–11)
BUN: 18.3 mg/dL (ref 7.0–26.0)
CO2: 26 mEq/L (ref 22–29)
CREATININE: 0.9 mg/dL (ref 0.6–1.1)
Calcium: 9.7 mg/dL (ref 8.4–10.4)
Chloride: 107 mEq/L (ref 98–109)
GLUCOSE: 82 mg/dL (ref 70–140)
Potassium: 4 mEq/L (ref 3.5–5.1)
Sodium: 144 mEq/L (ref 136–145)
Total Bilirubin: 0.54 mg/dL (ref 0.20–1.20)
Total Protein: 6.7 g/dL (ref 6.4–8.3)

## 2013-08-02 LAB — CA 125: CA 125: 6.7 U/mL (ref 0.0–30.2)

## 2013-08-02 MED ORDER — LEVOTHYROXINE SODIUM 50 MCG PO TABS: 50.0000 ug | ORAL_TABLET | Freq: Every day | ORAL | Status: AC

## 2013-08-02 MED ORDER — SODIUM CHLORIDE 0.9 % IJ SOLN
10.0000 mL | Freq: Once | INTRAMUSCULAR | Status: AC
  Administered 2013-08-02: 10 mL
  Filled 2013-08-02: qty 10

## 2013-08-02 MED ORDER — HEPARIN SOD (PORK) LOCK FLUSH 100 UNIT/ML IV SOLN
500.0000 [IU] | Freq: Once | INTRAVENOUS | Status: AC
  Administered 2013-08-02: 500 [IU]
  Filled 2013-08-02: qty 5

## 2013-08-02 NOTE — Progress Notes (Signed)
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 She had  CT AP 06-15-2013 and saw Dr Josephina Shih on 07-02-13. The CT had no obvious findings of concern, tho radiologist suggests attention to subtle stranding along distal abdominal aorta.  Patient has felt very well recently, tho still notices neuropathy in feet "like balloons under balls of feet" related to taxol; peripheral neuropathy in hands has almost completely resolved. She could tell improvement in strength of LLE with outpatient PT for what is thought to have been femoral nerve compression injury, and plans to continue exercises at home as instructed. We discussed referral back to PT if needed at later time. Energy is essentially back to baseline, appetite good, no pain, no abdominal or pelvic symptoms. She still has PAC in, flushed today.   ONCOLOGIC HISTORY Patient presented with RUQ pain in March 2014, with ED evaluation then including abdominal US not remarkable. She was then found to have pelvic mass, with MRI pelvis in [facility] 11-11-12 showing centrally necrotic pelvic mass 11.4 x 9.1 x 7.8 cm. CA 125 by Dr Dellis Filbert 11-13-2012 was 226.4. She saw Dr Josephina Shih in [place] on 11-17-12 and went to surgery by him at [facility] on 11-24-12. Surgery was complete debulking including TAH, BSO, omentectomy, right ureterolysis, bilateral pelvic lymphadenectomy and periaortic lymphadenectomy. There is no mention in the operative note of rupture of the ovarian capsule. 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 Pelvic washings [id number]) negative. Patient was hospitalized at [facility] x 5 days; per patient and husband, she was transfused 2 units PRBCs at [facility]. She developed acute herpes zoster left T 11-12 dermatome on 12-12-12, then severe pain left inguinal region and upper lateral left thigh such that start of dose dense adjuvant taxol carboplatin was delayed x2 prior to beginning 01-05-13. She has required neupogen x 1-2 days after each treatment, and carbo dose decreased to AUC =5 with cycle 2 and to AUC=3 with cycle 6 due to thrombocytopenia.  Note preoperative CA 125 on 11-13-12 was 226 and was 157 post operatively on 12-08-12; this was down to 29 on 01-19-13, to 19.9 on 03-30-13, and 17.6 on 04-20-13.     Review of systems as above, also: No fever or recent symptoms of infection. Appetite good, bowels moving regularly. No bleeding. No LE swelling. Hair is growing back.  Remainder of 10 point Review of Systems negative.  Objective:  Vital signs in last 24 hours:  Ht 5' 6.53" (1.69 m)  Wt 165 lb 11.2 oz (75.161 kg)  BMI 26.32 kg/m2  LMP 10/25/2012 BP 94/53, HR 57 regular, resp 18 not labored RA, temp 97.2 Weight is down 2 lbs. Alert, oriented and appropriate. Ambulatory without difficulty, wearing tennis shoes.  Alopecia resolved.  HEENT:PERRL, sclerae not icteric. Oral mucosa moist without lesions, posterior pharynx clear.  Neck supple. No JVD.  Lymphatics:no cervical,suraclavicular, axillary or inguinal adenopathy Resp: clear to auscultation bilaterally and normal percussion bilaterally Cardio: regular rate and rhythm. No gallop. GI: soft, nontender, not distended, no mass or organomegaly. Normally active bowel sounds. Surgical incision not remarkable.  Musculoskeletal/ Extremities: without pitting edema, cords, tenderness Neuro: peripheral neuropathy feet bilaterally as noted.  Skin without rash, ecchymosis, petechiae Breasts: without dominant  mass, skin or nipple findings. Axillae benign. Portacath-without erythema or tenderness  Lab Results:  Results for orders placed in visit on 08/02/13  CBC WITH DIFFERENTIAL      Result Value Range   WBC 4.5  3.9 - 10.3 10e3/uL   NEUT# 2.8  1.5 - 6.5 10e3/uL   HGB 11.3 (*) 11.6 - 15.9 g/dL   HCT 34.2 (*) 34.8 - 46.6 %   Platelets 136 (*) 145 - 400 10e3/uL   MCV 95.8  79.5 - 101.0 fL   MCH 31.7  25.1 - 34.0 pg   MCHC 33.0  31.5 - 36.0 g/dL   RBC 3.57 (*) 3.70 - 5.45 10e6/uL   RDW 11.3  11.2 - 14.5 %   lymph# 1.1  0.9 - 3.3 10e3/uL   MONO# 0.4  0.1 - 0.9 10e3/uL   Eosinophils Absolute 0.2  0.0 - 0.5 10e3/uL   Basophils Absolute 0.0  0.0 - 0.1 10e3/uL   NEUT% 62.6  38.4 - 76.8 %   LYMPH% 24.6  14.0 - 49.7 %   MONO% 8.4  0.0 - 14.0 %   EOS% 4.0  0.0 - 7.0 %   BASO% 0.4  0.0 - 2.0 %   nRBC 0  0 - 0 %  COMPREHENSIVE METABOLIC PANEL (QM57)      Result Value Range   Sodium 144  136 - 145 mEq/L   Potassium 4.0  3.5 - 5.1 mEq/L   Chloride 107  98 - 109 mEq/L   CO2 26  22 - 29 mEq/L   Glucose 82  70 - 140 mg/dl   BUN 18.3  7.0 - 26.0 mg/dL   Creatinine 0.9  0.6 - 1.1 mg/dL   Total Bilirubin 0.54  0.20 - 1.20 mg/dL   Alkaline Phosphatase 48  40 - 150 U/L   AST 17  5 - 34 U/L   ALT 11  0 - 55 U/L   Total Protein 6.7  6.4 - 8.3 g/dL   Albumin 4.0  3.5 - 5.0 g/dL   Calcium 9.7  8.4 - 10.4 mg/dL   Anion Gap 11  3 - 11 mEq/L  CA 125 available after visit 6.7   Studies/Results:   CT ABDOMEN AND PELVIS WITH CONTRAST 06-15-2013  COMPARISON: None.  FINDINGS:  There is no pleural effusion identified. The lung bases are clear.  Stable low-attenuation foci within the liver which likely represent  cysts. The gallbladder is normal. No biliary dilatation. Normal  appearance of the pancreas. The spleen is normal.  The adrenal glands both appear normal. Normal appearance of the  right kidney. The left kidney is also normal. Normal appearance of  the urinary bladder.  The abdominal aorta  has a normal caliber. Nonspecific soft tissue  haziness along the ventral surface of the distal abdominal aorta is  noted, image 39/series 2. No adenopathy identified. There is no  pelvic or inguinal adenopathy identified. Resolution of previous  fluid density within the left adnexal region compatible with a  benign, likely postsurgical etiology.  The stomach appears normal. The small bowel loops have a normal  course and caliber. Normal appearance of the colon.  No ascites or focal fluid collections identified within the abdomen  or pelvis. No peritoneal nodule or mass identified.  IMPRESSION:  1. There is a subtle area of soft tissue stranding along  the ventral  surface of the distal abdominal aorta just above the bifurcation.  Attention to this area on followup imaging is advised.  2. The remainder of the examination is negative without specific  features to suggest residual or recurrence of tumor.  Medications: I have reviewed the patient's current medications.  DISCUSSION: continue exercises for LLE as instructed by PT. We are encouraged that neuropathy in hands has resolved and hopefully she will get at least some further improvement in feet, tho she understands that this may not resolve completely.  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 CT AP without obvious findings of concern. She will see Dr Josephina Shih again in April. Genetics counseling consultation set up for 08-19-12.  2.herpes zoster just prior to start of chemotherapy. Patient remained on acyclovir prophylactically thru treatment  3. Left LE weakness since surgery and zoster, causing her to fall x2. Outpatient PT helpful, now completed. Strength not fully recovered LLE and may need additional PT depending  on how well she can improve on her own. 6.hypothyroid on replacement  3.lactose and gluten intolerance  4.PAC in, placed at [facility]. We have discussed keeping this flushed every 6-8 weeks when not used otherwise; she would like this removed when we feel it is no longer needed, understands this may be a couple of months after finishing chemo. We will coordinate flush in April with Dr ClarkePearson's visit and the following flush with visit to me in June if [facility] still in place then. 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 Continue massage and exercise  6.Intolerance to zofran with HA, severe restless legs with benadryl, intolerance to compazine, dislikes ativan. Used primarily phenergan for nausea during chemo 7 flu vaccine given today  8. Anemia related to chemo and multiple blood draws: improving off chemo. Follow.    Patient is in agreement with plan and knows that she can call if needed prior to next scheduled visit.    Kariel Skillman P, MD   08/02/2013, 11:21 AM

## 2013-08-03 ENCOUNTER — Telehealth: Payer: Self-pay

## 2013-08-03 NOTE — Telephone Encounter (Signed)
Told Candice Zamora the result of her Ca-125 from 08-02-13  as noted by Dr. Marko Plume.  Patient verbalized understanding.

## 2013-08-03 NOTE — Telephone Encounter (Signed)
Message copied by Baruch Merl on Tue Aug 03, 2013  1:34 PM ------      Message from: Gordy Levan      Created: Mon Aug 02, 2013  5:01 PM       Labs seen and need follow up: please let her know ca125 marker in very good low range today, 6.7 ------

## 2013-08-04 ENCOUNTER — Encounter: Payer: Self-pay | Admitting: Oncology

## 2013-08-04 NOTE — Progress Notes (Signed)
[place] END OF TREATMENT   Name: MYIESHA EDGAR Date: 08/04/2013 MRN: [id number] DOB: 03-01-1966   TREATMENT DATES: 01-05-2013 thru 05-11-2013   REFERRING PHYSICIAN: Deedra Ehrich  DIAGNOSIS: right ovarian carcinoma  STAGE AT START OF TREATMENT: IIB grade 3   INTENT: curative   DRUGS OR REGIMENS GIVEN: dose dense carboplatin taxol   MAJOR TOXICITIES: peripheral neuropathy in feet, cytopenias, fatigue   REASON TREATMENT STOPPED: completion of planned course   PERFORMANCE STATUS AT END: 1   ONGOING PROBLEMS: peripheral neuropathy in feet, mild anemia   FOLLOW UP PLANS: alternating follow up with gyn oncology and medical oncology

## 2013-08-09 ENCOUNTER — Telehealth: Payer: Self-pay | Admitting: *Deleted

## 2013-08-09 NOTE — Telephone Encounter (Signed)
THE RASH IS STILL MAINLY ON PT.'S RIGHT LEG. THERE IS A SMALL AREA ON PT.'S LEFT LEG. THIS INFORMATION TO DR.LIVESAY'S NURSE, LOUISE ARCHAMBAULT,RN.

## 2013-08-10 ENCOUNTER — Telehealth: Payer: Self-pay

## 2013-08-10 NOTE — Telephone Encounter (Signed)
Candice Zamora has been using OTC hydrocortisone cream bid to the rash with no improvement. Told her that Dr. Marko Plume suggested that she see her PCP Dr. Kelton Pillar and she can refer her to a dermatologist or she could self refer to a dermatologist as she does not have one. Ms. Gritz verbalized understanding.

## 2013-08-19 ENCOUNTER — Encounter: Payer: Self-pay | Admitting: Genetic Counselor

## 2013-08-19 ENCOUNTER — Ambulatory Visit (HOSPITAL_BASED_OUTPATIENT_CLINIC_OR_DEPARTMENT_OTHER): Payer: 59 | Admitting: Genetic Counselor

## 2013-08-19 ENCOUNTER — Other Ambulatory Visit: Payer: 59

## 2013-08-19 DIAGNOSIS — C561 Malignant neoplasm of right ovary: Secondary | ICD-10-CM

## 2013-08-19 DIAGNOSIS — C569 Malignant neoplasm of unspecified ovary: Secondary | ICD-10-CM

## 2013-08-19 DIAGNOSIS — IMO0002 Reserved for concepts with insufficient information to code with codable children: Secondary | ICD-10-CM

## 2013-08-19 DIAGNOSIS — Z8041 Family history of malignant neoplasm of ovary: Secondary | ICD-10-CM

## 2013-08-19 DIAGNOSIS — Z803 Family history of malignant neoplasm of breast: Secondary | ICD-10-CM

## 2013-08-19 NOTE — Progress Notes (Signed)
Dr.  Evlyn Clines requested a consultation for genetic counseling and risk assessment for Candice Zamora, a 48 y.o. female, for discussion of her personal history of ovarian cancer and family history of ovarian and breast cancer.  She presents to clinic today to discuss the possibility of a genetic predisposition to cancer, and to further clarify her risks, as well as her family members' risks for cancer.   HISTORY OF PRESENT ILLNESS: In 2014, at the age of 62, Candice Zamora was diagnosed with serous carcinoma of the right ovaray. This was treated with hysterectomy and chemotherapy.  She is up to date on her mammograms and has never had a breast biopsy.   The patient's mother was tested for BRCA mutations in 2010 and is reportedly negative.  She reportedly did not have del/dup testing.   Past Medical History  Diagnosis Date  . Ectopic pregnancy   . History of chicken pox   . Pelvic mass in female   . Abdominal pain   . Infertility, female   . Hemorrhoids   . Goiter   . Hypothyroidism   . Dermoid cyst   . Ovarian cancer 2014    serous ovarian cancer    Past Surgical History  Procedure Laterality Date  . Laparoscopy for ectopic pregnancy      10 years ago    History   Social History  . Marital Status: Married    Spouse Name: N/A    Number of Children: 2  . Years of Education: N/A   Occupational History  .  Unemployed   Social History Main Topics  . Smoking status: Never Smoker   . Smokeless tobacco: None  . Alcohol Use: No  . Drug Use: No  . Sexual Activity: None   Other Topics Concern  . None   Social History Narrative  . None    REPRODUCTIVE HISTORY AND PERSONAL RISK ASSESSMENT FACTORS: Menarche was at age 81.   Postmenopausal - surgical Uterus Intact: no Ovaries Intact: no G4P2A2, first live birth at age 36  She has previously undergone treatment for infertility.   Oral Contraceptive use: 7 years   She has not used HRT in the past.    FAMILY  HISTORY:  We obtained a detailed, 4-generation family history.  Significant diagnoses are listed below: Family History  Problem Relation Age of Onset  . Hypothyroidism Mother   . Heart disease Mother   . Ovarian cancer Mother 88    High grade serous ovarian cancer  . Heart disease Father   . Heart attack Father   . Hypothyroidism Sister   . Breast cancer Maternal Aunt 67  . Diabetes Maternal Grandfather   . Diabetes Paternal Grandmother   . Cancer Other     maternal grandmother's sister had an "abdominal" cancer in her 23s  . Cancer Other     maternal grandmother's mother had "abdominal cancer"   Patient's maternal ancestors are of [place] and [place] descent, and paternal ancestors are of [place] and [place] descent. There is no reported Ashkenazi Jewish ancestry. There is no known consanguinity.  GENETIC COUNSELING ASSESSMENT: Candice Zamora is a 48 y.o. female with a personal history of ovarian cancer and family history of ovarian and breast cancer which somewhat suggestive of a hereditary cancer syndrome and predisposition to cancer. We, therefore, discussed and recommended the following at today's visit.   DISCUSSION: We reviewed the characteristics, features and inheritance patterns of hereditary cancer syndromes. We also discussed genetic testing,  including the appropriate family members to test, the process of testing, insurance coverage and turn-around-time for results. We reviewed hereditary cancer syndromes that can increase the risk for ovarian cancer, including BRCA mutations and Lynch syndrome.  We also discussed an increased risk for other, less common, hereditary cancer genes associated with ovarian cancer.  Ms. Lahmann is motivated to learn about the genetics of the cancer in her family as she is worried about her own risk for other cancers, as well as that for her sisters and children.  PLAN: After considering the risks, benefits, and limitations, FADIA MARLAR provided informed  consent to pursue genetic testing and the blood sample will be sent to [facility] for analysis of the Breast/Ovarian Cancer Panel. We discussed the implications of a positive, negative and/ or variant of uncertain significance genetic test result. Results should be available within approximately 3 weeks' time, at which point they will be disclosed by telephone to [facility], as will any additional recommendations warranted by these results. Conny Situ Berrios will receive a summary of her genetic counseling visit and a copy of her results once available. This information will also be available in Epic. We encouraged Tonnya Garbett Deboard to remain in contact with cancer genetics annually so that we can continuously update the family history and inform her of any changes in cancer genetics and testing that may be of benefit for her family. Su Grand Morina's questions were answered to her satisfaction today. Our contact information was provided should additional questions or concerns arise.  The patient was seen for a total of 60 minutes, greater than 50% of which was spent face-to-face counseling.  This note will also be sent to the referring provider via the electronic medical record. The patient will be supplied with a summary of this genetic counseling discussion as well as educational information on the discussed hereditary cancer syndromes following the conclusion of their visit.   Patient was discussed with Dr. Marcy Panning.   _______________________________________________________________________ For Office Staff:  Number of people involved in session: 1 Was an Intern/ student involved with case: yes

## 2013-09-06 ENCOUNTER — Telehealth: Payer: Self-pay | Admitting: Genetic Counselor

## 2013-09-06 NOTE — Telephone Encounter (Signed)
Left good news message on machine.  Asked that she call back.

## 2013-09-09 ENCOUNTER — Telehealth: Payer: Self-pay | Admitting: Genetic Counselor

## 2013-09-09 ENCOUNTER — Encounter: Payer: Self-pay | Admitting: Genetic Counselor

## 2013-09-09 NOTE — Telephone Encounter (Signed)
Revealed negative genetic test results 

## 2013-09-13 ENCOUNTER — Ambulatory Visit (HOSPITAL_BASED_OUTPATIENT_CLINIC_OR_DEPARTMENT_OTHER): Payer: 59

## 2013-09-13 ENCOUNTER — Other Ambulatory Visit (HOSPITAL_BASED_OUTPATIENT_CLINIC_OR_DEPARTMENT_OTHER): Payer: 59

## 2013-09-13 VITALS — BP 100/50 | HR 65 | Temp 97.0°F | Resp 17

## 2013-09-13 DIAGNOSIS — C569 Malignant neoplasm of unspecified ovary: Secondary | ICD-10-CM

## 2013-09-13 DIAGNOSIS — Z452 Encounter for adjustment and management of vascular access device: Secondary | ICD-10-CM

## 2013-09-13 LAB — CBC WITH DIFFERENTIAL/PLATELET
BASO%: 0.4 % (ref 0.0–2.0)
BASOS ABS: 0 10*3/uL (ref 0.0–0.1)
EOS%: 7.1 % — AB (ref 0.0–7.0)
Eosinophils Absolute: 0.2 10*3/uL (ref 0.0–0.5)
HCT: 34.5 % — ABNORMAL LOW (ref 34.8–46.6)
HGB: 11.4 g/dL — ABNORMAL LOW (ref 11.6–15.9)
LYMPH%: 33.3 % (ref 14.0–49.7)
MCH: 31.2 pg (ref 25.1–34.0)
MCHC: 33.2 g/dL (ref 31.5–36.0)
MCV: 94.1 fL (ref 79.5–101.0)
MONO#: 0.3 10*3/uL (ref 0.1–0.9)
MONO%: 7.8 % (ref 0.0–14.0)
NEUT#: 1.7 10*3/uL (ref 1.5–6.5)
NEUT%: 51.4 % (ref 38.4–76.8)
PLATELETS: 140 10*3/uL — AB (ref 145–400)
RBC: 3.67 10*6/uL — AB (ref 3.70–5.45)
RDW: 11.9 % (ref 11.2–14.5)
WBC: 3.4 10*3/uL — ABNORMAL LOW (ref 3.9–10.3)
lymph#: 1.1 10*3/uL (ref 0.9–3.3)

## 2013-09-13 LAB — CA 125: CA 125: 7.3 U/mL (ref 0.0–30.2)

## 2013-09-13 MED ORDER — HEPARIN SOD (PORK) LOCK FLUSH 100 UNIT/ML IV SOLN
500.0000 [IU] | Freq: Once | INTRAVENOUS | Status: AC
  Administered 2013-09-13: 500 [IU] via INTRAVENOUS
  Filled 2013-09-13: qty 5

## 2013-09-13 MED ORDER — SODIUM CHLORIDE 0.9 % IJ SOLN
10.0000 mL | INTRAMUSCULAR | Status: DC | PRN
  Administered 2013-09-13: 10 mL via INTRAVENOUS
  Filled 2013-09-13: qty 10

## 2013-09-13 NOTE — Patient Instructions (Signed)

## 2013-09-14 ENCOUNTER — Telehealth: Payer: Self-pay

## 2013-09-14 NOTE — Telephone Encounter (Signed)
Told Candice Zamora that her ca-125 was WNL at 7.3 yesterday.

## 2013-09-14 NOTE — Telephone Encounter (Signed)
Told pharmacist that refills are to be from PCP Dr. Kelton Pillar per last refill note.

## 2013-10-22 ENCOUNTER — Encounter: Payer: Self-pay | Admitting: Gynecology

## 2013-10-22 ENCOUNTER — Ambulatory Visit: Payer: 59

## 2013-10-22 ENCOUNTER — Ambulatory Visit: Payer: 59 | Attending: Gynecology | Admitting: Gynecology

## 2013-10-22 ENCOUNTER — Other Ambulatory Visit (HOSPITAL_BASED_OUTPATIENT_CLINIC_OR_DEPARTMENT_OTHER): Payer: 59

## 2013-10-22 VITALS — BP 97/52 | HR 65 | Temp 97.6°F

## 2013-10-22 VITALS — BP 97/52 | HR 64 | Temp 97.7°F | Resp 16 | Ht 66.0 in | Wt 172.3 lb

## 2013-10-22 DIAGNOSIS — C569 Malignant neoplasm of unspecified ovary: Secondary | ICD-10-CM

## 2013-10-22 DIAGNOSIS — Z95828 Presence of other vascular implants and grafts: Secondary | ICD-10-CM

## 2013-10-22 DIAGNOSIS — Z09 Encounter for follow-up examination after completed treatment for conditions other than malignant neoplasm: Secondary | ICD-10-CM | POA: Insufficient documentation

## 2013-10-22 DIAGNOSIS — Z452 Encounter for adjustment and management of vascular access device: Secondary | ICD-10-CM

## 2013-10-22 DIAGNOSIS — Z8543 Personal history of malignant neoplasm of ovary: Secondary | ICD-10-CM | POA: Insufficient documentation

## 2013-10-22 LAB — CBC WITH DIFFERENTIAL/PLATELET
BASO%: 0.6 % (ref 0.0–2.0)
Basophils Absolute: 0 10*3/uL (ref 0.0–0.1)
EOS ABS: 0.2 10*3/uL (ref 0.0–0.5)
EOS%: 5.7 % (ref 0.0–7.0)
HCT: 34.9 % (ref 34.8–46.6)
HGB: 11.5 g/dL — ABNORMAL LOW (ref 11.6–15.9)
LYMPH%: 38.4 % (ref 14.0–49.7)
MCH: 30.8 pg (ref 25.1–34.0)
MCHC: 32.9 g/dL (ref 31.5–36.0)
MCV: 93.7 fL (ref 79.5–101.0)
MONO#: 0.3 10*3/uL (ref 0.1–0.9)
MONO%: 8.1 % (ref 0.0–14.0)
NEUT%: 47.2 % (ref 38.4–76.8)
NEUTROS ABS: 1.8 10*3/uL (ref 1.5–6.5)
Platelets: 162 10*3/uL (ref 145–400)
RBC: 3.72 10*6/uL (ref 3.70–5.45)
RDW: 12.4 % (ref 11.2–14.5)
WBC: 3.7 10*3/uL — AB (ref 3.9–10.3)
lymph#: 1.4 10*3/uL (ref 0.9–3.3)

## 2013-10-22 MED ORDER — SODIUM CHLORIDE 0.9 % IJ SOLN
10.0000 mL | INTRAMUSCULAR | Status: DC | PRN
  Administered 2013-10-22: 10 mL via INTRAVENOUS
  Filled 2013-10-22: qty 10

## 2013-10-22 MED ORDER — HEPARIN SOD (PORK) LOCK FLUSH 100 UNIT/ML IV SOLN
500.0000 [IU] | Freq: Once | INTRAVENOUS | Status: AC
  Administered 2013-10-22: 500 [IU] via INTRAVENOUS
  Filled 2013-10-22: qty 5

## 2013-10-22 NOTE — Patient Instructions (Signed)
We will contact you with the results of your CA 125.  Follow up in three months or sooner if needed.

## 2013-10-22 NOTE — Progress Notes (Signed)
Consult Note: Gyn-Onc   Candice Zamora 48 y.o. female  Chief Complaint  Patient presents with  . Ovarian Cancer    Follow up    Assessment : Stage IIB ovarian cancer clinically free of disease. peripheral neuropathy (improved). 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 Plan:  Patient returns see me in 3 months. We will contact her with her CA 125 report from today.    Interval history: The patient returns today as previously scheduled. She is now completed 6 cycles of dose dense carboplatin and Taxol chemotherapy. Followup CT scan on 06/15/2013 shows no evidence of disease and CA 125 was 17.   She continues to have peripheral neuropathy in her hands and feet but it is much improved and not compromising her lifestyle.. She's also worked with a physical therapist with regard to what sounds like a left femoral nerve compression injury which is getting better with physical therapy.. .. Appetite is good she has no GI or GU symptoms.  Her functional status is excellent and she is working full time.Marland Kitchen     HPI:  55 her old white married female gravida 4 para 2 seen in consultation request of Dr.[name] regarding management of a newly diagnosed complex pelvic mass. The patient has had several months of lower nominal pain and initially underwent a pelvic ultrasound on April 28 that showed a 10.8 x 8.7 x 9.2 cm heterogeneous pelvic mass. Small fibroids were also noted. The adnexa were poorly seen. Subsequently she underwent a MRI of the pelvis which demonstrated a centrally necrotic pelvic mass which may arise from the posterior uterus or the right adnexa. She had a normal-appearing left ovary moderate ascites and no evidence of adenopathy. CA 125 is 226 units per mL CEA 1.5 (normal)OVA1 was 9.4 (upper limits of normal for premenopausal patient is 5) Patient underwent exploratory laparotomy, TAH, BSO, omentectomy, pelvic and aortic lymphadenectomy at [facility] on  11/24/2012. She had 11 x 9 x 7 cm right adnexal mass which was densely adherent to the right pelvic sidewall (stage IIB). 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 All other biopsies including pelvic and para-aortic lymphadenectomy were negative.  Postoperatively she was treated with dose dense carboplatin and Taxol. For 6 cycles. CT scan the end of therapy on 06/15/2013 was normal and CA 125 is 17 units per mL.   Review of Systems:10 point review of systems is negative except as noted in interval history.   Vitals: Blood pressure 97/52, pulse 64, temperature 97.7 F (36.5 C), temperature source Oral, resp. rate 16, height 5\' 6"  (1.676 m), weight 172 lb 4.8 oz (78.155 kg), last menstrual period 10/25/2012.  Physical Exam: General : The patient is a healthy woman in no acute distress.  HEENT: normocephalic, extraoccular movements normal; neck is supple without thyromegally  Lynphnodes: Supraclavicular and inguinal nodes not enlarged  Abdomen: Soft, non-tender, no ascites, no organomegally., no hernias, incision is well-healed Pelvic:  EGBUS: Normal female  Vagina: Normal, no lesions , the cuff is healed. Urethra and Bladder: Normal, non-tender  Cervix: Surgically absent   Uterus: I surgically absent   Rectal: normal sphincter ton, confirmed pelvic mass., no blood  Lower extremities: No edema or varicosities. Normal range of motion      Allergies  Allergen Reactions  . Compazine [Prochlorperazine Maleate] Other (See Comments)    Jittery  . Zofran [Ondansetron Hcl] Other (See Comments)    headache  . Sulfa Antibiotics Rash    Past Medical History  Diagnosis Date  . Ectopic pregnancy   . History of chicken pox   . Pelvic mass in female   . Abdominal pain   . Infertility, female   . Hemorrhoids   . Goiter   . Hypothyroidism   . Dermoid cyst   . Ovarian cancer 2014    serous ovarian cancer    Past Surgical History  Procedure Laterality Date  .  Laparoscopy for ectopic pregnancy      10 years ago    Current Outpatient Prescriptions  Medication Sig Dispense Refill  . HEMOCYTE 324 MG TABS tablet Take 1 tablet (106 mg of iron total) by mouth daily.  30 each  2  . levothyroxine (SYNTHROID, LEVOTHROID) 50 MCG tablet Take 1 tablet (50 mcg total) by mouth daily.  30 tablet  0  . oxyCODONE-acetaminophen (PERCOCET/ROXICET) 5-325 MG per tablet Take 1 tablet by mouth every 6 (six) hours as needed for pain.  15 tablet  0   No current facility-administered medications for this visit.    History   Social History  . Marital Status: Married    Spouse Name: N/A    Number of Children: 2  . Years of Education: N/A   Occupational History  .  Unemployed   Social History Main Topics  . Smoking status: Never Smoker   . Smokeless tobacco: Not on file  . Alcohol Use: No  . Drug Use: No  . Sexual Activity: Not on file   Other Topics Concern  . Not on file   Social History Narrative  . No narrative on file    Family History  Problem Relation Age of Onset  . Hypothyroidism Mother   . Heart disease Mother   . Ovarian cancer Mother 7    High grade serous ovarian cancer  . Heart disease Father   . Heart attack Father   . Hypothyroidism Sister   . Breast cancer Maternal Aunt 67  . Diabetes Maternal Grandfather   . Diabetes Paternal Grandmother   . Cancer Other     maternal grandmother's sister had an "abdominal" cancer in her 105s  . Cancer Other     maternal grandmother's mother had "abdominal cancer"      Alvino Chapel, MD 10/22/2013, 2:53 PM

## 2013-10-23 LAB — CA 125: CA 125: 7.8 U/mL (ref 0.0–30.2)

## 2013-10-25 ENCOUNTER — Telehealth: Payer: Self-pay | Admitting: *Deleted

## 2013-10-25 NOTE — Telephone Encounter (Signed)
Message copied by Sharlynn Oliphant A on Mon Oct 25, 2013  9:30 AM ------      Message from: Gordy Levan      Created: Fri Oct 22, 2013  6:51 PM       Labs seen and need follow up please let her know blood counts all improved on CBC 4-10, with WBC up to 3.7, ANC better at 18, hemoglobin better at 11.5 and platelets back into normal range at 162. (CA125 not resulted at time of this note -- may want to coordinate phone calls CBC and ca125 with gyn onc) ------

## 2013-10-25 NOTE — Telephone Encounter (Signed)
Pt notified of CA125 results and lab below.

## 2013-12-12 ENCOUNTER — Other Ambulatory Visit: Payer: Self-pay | Admitting: Oncology

## 2013-12-13 ENCOUNTER — Ambulatory Visit (HOSPITAL_BASED_OUTPATIENT_CLINIC_OR_DEPARTMENT_OTHER): Payer: 59

## 2013-12-13 ENCOUNTER — Ambulatory Visit (HOSPITAL_BASED_OUTPATIENT_CLINIC_OR_DEPARTMENT_OTHER): Payer: 59 | Admitting: Oncology

## 2013-12-13 ENCOUNTER — Other Ambulatory Visit (HOSPITAL_BASED_OUTPATIENT_CLINIC_OR_DEPARTMENT_OTHER): Payer: 59

## 2013-12-13 ENCOUNTER — Telehealth: Payer: Self-pay | Admitting: Oncology

## 2013-12-13 ENCOUNTER — Encounter: Payer: Self-pay | Admitting: Oncology

## 2013-12-13 VITALS — BP 99/68 | HR 68 | Temp 97.7°F

## 2013-12-13 VITALS — BP 109/72 | HR 63 | Temp 97.9°F | Resp 18 | Ht 66.0 in | Wt 177.3 lb

## 2013-12-13 DIAGNOSIS — C569 Malignant neoplasm of unspecified ovary: Secondary | ICD-10-CM

## 2013-12-13 DIAGNOSIS — G620 Drug-induced polyneuropathy: Secondary | ICD-10-CM

## 2013-12-13 DIAGNOSIS — D721 Eosinophilia, unspecified: Secondary | ICD-10-CM

## 2013-12-13 DIAGNOSIS — Z95828 Presence of other vascular implants and grafts: Secondary | ICD-10-CM

## 2013-12-13 LAB — CBC WITH DIFFERENTIAL/PLATELET
BASO%: 0.5 % (ref 0.0–2.0)
BASOS ABS: 0 10*3/uL (ref 0.0–0.1)
EOS ABS: 0.3 10*3/uL (ref 0.0–0.5)
EOS%: 8.2 % — AB (ref 0.0–7.0)
HEMATOCRIT: 36.3 % (ref 34.8–46.6)
HEMOGLOBIN: 12.1 g/dL (ref 11.6–15.9)
LYMPH#: 1.2 10*3/uL (ref 0.9–3.3)
LYMPH%: 31.8 % (ref 14.0–49.7)
MCH: 31.1 pg (ref 25.1–34.0)
MCHC: 33.3 g/dL (ref 31.5–36.0)
MCV: 93.3 fL (ref 79.5–101.0)
MONO#: 0.3 10*3/uL (ref 0.1–0.9)
MONO%: 7.7 % (ref 0.0–14.0)
NEUT#: 2 10*3/uL (ref 1.5–6.5)
NEUT%: 51.8 % (ref 38.4–76.8)
Platelets: 156 10*3/uL (ref 145–400)
RBC: 3.89 10*6/uL (ref 3.70–5.45)
RDW: 12.3 % (ref 11.2–14.5)
WBC: 3.8 10*3/uL — AB (ref 3.9–10.3)
nRBC: 0 % (ref 0–0)

## 2013-12-13 LAB — COMPREHENSIVE METABOLIC PANEL (CC13)
ALK PHOS: 72 U/L (ref 40–150)
ALT: 16 U/L (ref 0–55)
AST: 19 U/L (ref 5–34)
Albumin: 3.8 g/dL (ref 3.5–5.0)
Anion Gap: 14 mEq/L — ABNORMAL HIGH (ref 3–11)
BUN: 19.4 mg/dL (ref 7.0–26.0)
CALCIUM: 9.4 mg/dL (ref 8.4–10.4)
CHLORIDE: 108 meq/L (ref 98–109)
CO2: 22 meq/L (ref 22–29)
Creatinine: 0.9 mg/dL (ref 0.6–1.1)
GLUCOSE: 129 mg/dL (ref 70–140)
POTASSIUM: 3.8 meq/L (ref 3.5–5.1)
SODIUM: 143 meq/L (ref 136–145)
TOTAL PROTEIN: 6.8 g/dL (ref 6.4–8.3)
Total Bilirubin: 0.49 mg/dL (ref 0.20–1.20)

## 2013-12-13 MED ORDER — HEPARIN SOD (PORK) LOCK FLUSH 100 UNIT/ML IV SOLN
500.0000 [IU] | Freq: Once | INTRAVENOUS | Status: AC
Start: 2013-12-13 — End: 2013-12-13
  Administered 2013-12-13: 500 [IU] via INTRAVENOUS
  Filled 2013-12-13: qty 5

## 2013-12-13 MED ORDER — SODIUM CHLORIDE 0.9 % IJ SOLN
10.0000 mL | INTRAMUSCULAR | Status: DC | PRN
  Administered 2013-12-13: 10 mL via INTRAVENOUS
  Filled 2013-12-13: qty 10

## 2013-12-13 NOTE — Telephone Encounter (Signed)
per pof to sch lab & flush w/ CP appt-added & ave pt copy of sch

## 2013-12-13 NOTE — Patient Instructions (Signed)

## 2013-12-13 NOTE — Progress Notes (Signed)
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 She saw Dr Josephina Shih [phone] and is to see him again in July. Last CT AP was 04-2013.  She has been well since she was seen last with exception of food intolerances, now seeing Dr Claybon Jabs with gluten challenge in process as this is thought likely cause of new rash on extremities; she has had intermittent abdominal pain and associated back pain since back on gluten for last couple of weeks, thought associated with the gluten intolerance. Other than the rash, only problems prior to resuming gluten were slight residual LLE discomfort and mild residual neuropathy in fingertips and feet since taxol. 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  She has PAC in, which she prefers to keep for now.   ONCOLOGIC HISTORY Patient presented with RUQ pain in March 2014, with ED evaluation then including abdominal US not remarkable. She was then found to have pelvic mass, with MRI pelvis in [place] system 11-11-12 showing centrally necrotic pelvic mass 11.4 x 9.1 x 7.8 cm. CA 125 by Dr Dellis Filbert 11-13-2012 was 226.4. She saw Dr Josephina Shih in [place] on 11-17-12 and went to surgery by him at [facility] on 11-24-12. Surgery was complete debulking including TAH, BSO, omentectomy, right ureterolysis, bilateral pelvic lymphadenectomy and periaortic lymphadenectomy. There is no mention in the operative note of rupture of the ovarian capsule. 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 Pelvic washings ([id number]) negative. Patient was hospitalized at [facility] x 5 days; per patient and husband, she was transfused 2 units PRBCs at [facility]. She developed acute herpes zoster left T 11-12 dermatome on 12-12-12, then severe pain left inguinal region and upper lateral left thigh such that start of dose dense adjuvant taxol carboplatin was delayed x2 prior to beginning 01-05-13. She required neupogen x 1-2 days after each treatment, and carbo dose decreased to AUC =5 with cycle 2 and to AUC=3 with cycle 6 due to thrombocytopenia.  Note preoperative CA 125 on 11-13-12 was 226 and was 157 post operatively on 12-08-12; this was down to 29 on 01-19-13 and 17.6 on 04-20-13.  Review of systems as above, also: No fever or symptoms of infection. No respiratory or cardiac complaints. No LE swelling. No problems with PAC. Good energy and enjoying eating without the gluten restrictions. Remainder of 10 point Review of Systems negative.  Objective:  Vital signs in last 24 hours:  BP 109/72  Pulse 63  Temp(Src) 97.9 F (36.6 C) (Oral)  Resp 18  Ht 5\' 6"  (1.676 m)  Wt 177 lb 4.8 oz (80.423 kg)  BMI 28.63 kg/m2  SpO2 99%  LMP 10/25/2012 Weight is up 5 lbs. Alert, oriented and appropriate. Ambulatory without difficulty.  No alopecia  HEENT:PERRL, sclerae not icteric. Oral mucosa moist without lesions, posterior pharynx clear.  Neck supple. No JVD.  Lymphatics:no cervical,suraclavicular, axillary or inguinal adenopathy Resp: clear to auscultation bilaterally and normal percussion bilaterally Cardio: regular rate and rhythm. No gallop. GI: soft, nontender,  not distended, no mass or organomegaly. Normally active bowel sounds. Surgical incision not remarkable. Musculoskeletal/ Extremities: without pitting edema, cords, tenderness. Back nontender. Neuro:minimal decreased sensation tips of fingers only, no change  peripheral neuropathy in feet,  Otherwise nonfocal Skin Papular rash anteroir shins covering ~ 8 x 10cm. Skin otherwise without ecchymosis, petechiae Portacath-without erythema or tenderness  Lab Results:  Results for orders placed in visit on 12/13/13  CBC WITH DIFFERENTIAL      Result Value Ref Range   WBC 3.8 (*) 3.9 - 10.3 10e3/uL   NEUT# 2.0  1.5 - 6.5 10e3/uL   HGB 12.1  11.6 - 15.9 g/dL   HCT 36.3  34.8 - 46.6 %   Platelets 156  145 - 400 10e3/uL   MCV 93.3  79.5 - 101.0 fL   MCH 31.1  25.1 - 34.0 pg   MCHC 33.3  31.5 - 36.0 g/dL   RBC 3.89  3.70 - 5.45 10e6/uL   RDW 12.3  11.2 - 14.5 %   lymph# 1.2  0.9 - 3.3 10e3/uL   MONO# 0.3  0.1 - 0.9 10e3/uL   Eosinophils Absolute 0.3  0.0 - 0.5 10e3/uL   Basophils Absolute 0.0  0.0 - 0.1 10e3/uL   NEUT% 51.8  38.4 - 76.8 %   LYMPH% 31.8  14.0 - 49.7 %   MONO% 7.7  0.0 - 14.0 %   EOS% 8.2 (*) 0.0 - 7.0 %   BASO% 0.5  0.0 - 2.0 %   nRBC 0  0 - 0 %  COMPREHENSIVE METABOLIC PANEL (XL24)      Result Value Ref Range   Sodium 143  136 - 145 mEq/L   Potassium 3.8  3.5 - 5.1 mEq/L   Chloride 108  98 - 109 mEq/L   CO2 22  22 - 29 mEq/L   Glucose 129  70 - 140 mg/dl   BUN 19.4  7.0 - 26.0 mg/dL   Creatinine 0.9  0.6 - 1.1 mg/dL   Total Bilirubin 0.49  0.20 - 1.20 mg/dL   Alkaline Phosphatase 72  40 - 150 U/L   AST 19  5 - 34 U/L   ALT 16  0 - 55 U/L   Total Protein 6.8  6.4 - 8.3 g/dL   Albumin 3.8  3.5 - 5.0 g/dL   Calcium 9.4  8.4 - 10.4 mg/dL   Anion Gap 14 (*) 3 - 11 mEq/L    CA 125 resulted after visit 8.1, having been 7.8 in April and 6.7 in Jan.  Studies/Results:  No results found. Last mammograms in this EMR 01-21-12; age 67  Medications: I have reviewed the patient's current medications.  DISCUSSION: we have discussed removing PAC, however she prefers to keep this for now.   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 CT AP without obvious findings of concern. She will see Dr Josephina Shih again in July and I will see her again ~ Nov, coordinating with PAC flush. 2.herpes zoster just prior to start of chemotherapy, resolved 3. Left LE weakness since surgery and zoster. Outpatient PT helpful, continues exercises as instructed. 6.hypothyroid on replacement  3.lactose and gluten intolerance: gluten challenge by allergist in process due to rash thought related to gluten. Likely causing abdominal and back discomfort now, follow. Note eosinophils elevated on differential  today 4.PAC in, placed at [facility]. Needs to be flushed every 6-8 weeks when not used otherwise. Will flush next with Dr Tamela Oddi visit in July, labs with flush. May consider removing, but patient prefers not to do this just now.  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 Continue massage and exercise  6.Intolerance to zofran with HA, severe restless legs with benadryl, intolerance to compazine, dislikes ativan. Used primarily phenergan for nausea during chemo  7 Anemia related to chemo and multiple blood draws: hgb back to normal range now.    Patient understood discussion and is in agreement with plan    Gordy Levan, MD   12/13/2013, 10:04 AM

## 2013-12-14 LAB — CA 125: CA 125: 8.1 U/mL (ref 0.0–30.2)

## 2013-12-15 ENCOUNTER — Telehealth: Payer: Self-pay | Admitting: *Deleted

## 2013-12-15 NOTE — Telephone Encounter (Signed)
Left message with results

## 2013-12-15 NOTE — Telephone Encounter (Signed)
Left message to call.

## 2013-12-15 NOTE — Telephone Encounter (Signed)
Message copied by Patton Salles on Wed Dec 15, 2013  4:21 PM ------      Message from: Evlyn Clines P      Created: Wed Dec 15, 2013  3:18 PM       Labs seen and need follow up: please let her know ca125 is still in good range at 8.1 ------

## 2013-12-15 NOTE — Telephone Encounter (Signed)
Message copied by Patton Salles on Wed Dec 15, 2013  3:30 PM ------      Message from: Evlyn Clines P      Created: Wed Dec 15, 2013  3:18 PM       Labs seen and need follow up: please let her know ca125 is still in good range at 8.1 ------

## 2014-01-28 ENCOUNTER — Encounter: Payer: Self-pay | Admitting: Gynecology

## 2014-01-28 ENCOUNTER — Ambulatory Visit (HOSPITAL_BASED_OUTPATIENT_CLINIC_OR_DEPARTMENT_OTHER): Payer: 59

## 2014-01-28 ENCOUNTER — Other Ambulatory Visit: Payer: 59

## 2014-01-28 ENCOUNTER — Ambulatory Visit: Payer: 59 | Attending: Gynecology | Admitting: Gynecology

## 2014-01-28 VITALS — BP 106/57 | HR 74 | Temp 98.0°F | Resp 18 | Ht 66.0 in | Wt 178.0 lb

## 2014-01-28 DIAGNOSIS — C569 Malignant neoplasm of unspecified ovary: Secondary | ICD-10-CM | POA: Insufficient documentation

## 2014-01-28 DIAGNOSIS — E039 Hypothyroidism, unspecified: Secondary | ICD-10-CM | POA: Insufficient documentation

## 2014-01-28 DIAGNOSIS — Z95828 Presence of other vascular implants and grafts: Secondary | ICD-10-CM

## 2014-01-28 DIAGNOSIS — Z90710 Acquired absence of both cervix and uterus: Secondary | ICD-10-CM | POA: Insufficient documentation

## 2014-01-28 DIAGNOSIS — Z8543 Personal history of malignant neoplasm of ovary: Secondary | ICD-10-CM

## 2014-01-28 DIAGNOSIS — Z79899 Other long term (current) drug therapy: Secondary | ICD-10-CM | POA: Insufficient documentation

## 2014-01-28 DIAGNOSIS — Z452 Encounter for adjustment and management of vascular access device: Secondary | ICD-10-CM

## 2014-01-28 MED ORDER — SODIUM CHLORIDE 0.9 % IJ SOLN
10.0000 mL | INTRAMUSCULAR | Status: DC | PRN
  Administered 2014-01-28: 10 mL via INTRAVENOUS
  Filled 2014-01-28: qty 10

## 2014-01-28 MED ORDER — HEPARIN SOD (PORK) LOCK FLUSH 100 UNIT/ML IV SOLN
500.0000 [IU] | Freq: Once | INTRAVENOUS | Status: AC
Start: 2014-01-28 — End: 2014-01-28
  Administered 2014-01-28: 500 [IU] via INTRAVENOUS
  Filled 2014-01-28: qty 5

## 2014-01-28 NOTE — Patient Instructions (Signed)
Return to see us in 6 months. 

## 2014-01-28 NOTE — Progress Notes (Signed)
Consult Note: Gyn-Onc   Candice Zamora 48 y.o. female  Chief Complaint  Patient presents with  . Ovarian Cancer    Follow up     Assessment : Stage IIB ovarian cancer clinically free of disease. Plan:  Patient returns see me in 3 months.    Interval history The patient returns today as previously scheduled. She completed 6 cycles of adjuvant dose dense carboplatin and Taxol chemotherapy. Followup CT scan on 06/15/2013 shows no evidence of disease.     Appetite is good she has no GI or GU symptoms. She denies any pelvic symptoms.  Her functional status is excellent and she is working full time.. : CA 125 on 12/13/2013 was 8.1 units per mL.    HPI:  60 her old white married female gravida 4 para 2 seen in consultation request of Dr.[name] regarding management of a newly diagnosed complex pelvic mass. The patient has had several months of lower nominal pain and initially underwent a pelvic ultrasound on April 28 that showed a 10.8 x 8.7 x 9.2 cm heterogeneous pelvic mass. Small fibroids were also noted. The adnexa were poorly seen. Subsequently she underwent a MRI of the pelvis which demonstrated a centrally necrotic pelvic mass which may arise from the posterior uterus or the right adnexa. She had a normal-appearing left ovary moderate ascites and no evidence of adenopathy. CA 125 is 226 units per mL CEA 1.5 (normal)OVA1 was 9.4 (upper limits of normal for premenopausal patient is 5) Patient underwent exploratory laparotomy, TAH, BSO, omentectomy, pelvic and aortic lymphadenectomy at [facility] on 11/24/2012. She had 11 x 9 x 7 cm right adnexal mass which was densely adherent to the right pelvic sidewall (stage IIB). 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 All other biopsies including pelvic and para-aortic lymphadenectomy were negative.  Postoperatively she was treated with dose dense carboplatin and Taxol. For 6 cycles. CT scan the end of therapy on 06/15/2013 was  normal and CA 125 is 17 units per mL.   Review of Systems:10 point review of systems is negative except as noted in interval history.   Vitals: Blood pressure 106/57, pulse 74, temperature 98 F (36.7 C), temperature source Oral, resp. rate 18, height 5\' 6"  (1.676 m), weight 178 lb (80.74 kg), last menstrual period 10/25/2012.  Physical Exam: General : The patient is a healthy woman in no acute distress.  HEENT: normocephalic, extraoccular movements normal; neck is supple without thyromegally  Lynphnodes: Supraclavicular and inguinal nodes not enlarged  Abdomen: Soft, non-tender, no ascites, no organomegally., no hernias, incision is well-healed Pelvic:  EGBUS: Normal female  Vagina: Normal, no lesions , the cuff is healed. Urethra and Bladder: Normal, non-tender  Cervix: Surgically absent   Uterus: I surgically absent   Rectal: normal sphincter ton, confirmed pelvic mass., no blood  Lower extremities: No edema or varicosities. Normal range of motion      Allergies  Allergen Reactions  . Compazine [Prochlorperazine Maleate] Other (See Comments)    Jittery  . Gluten Meal     Not celiac but pt. Does not tolerate gluten in meals.  . Lactase Swelling  . Soybean Oil Swelling  . Zofran [Ondansetron Hcl] Other (See Comments)    headache  . Sulfa Antibiotics Rash    Past Medical History  Diagnosis Date  . Ectopic pregnancy   . History of chicken pox   . Pelvic mass in female   . Abdominal pain   . Infertility, female   . Hemorrhoids   .  Goiter   . Hypothyroidism   . Dermoid cyst   . Ovarian cancer 2014    serous ovarian cancer    Past Surgical History  Procedure Laterality Date  . Laparoscopy for ectopic pregnancy      10 years ago    Current Outpatient Prescriptions  Medication Sig Dispense Refill  . ELIDEL 1 % cream       . HEMOCYTE 324 MG TABS tablet Take 1 tablet (106 mg of iron total) by mouth daily.  30 each  2  . levothyroxine (SYNTHROID, LEVOTHROID) 50  MCG tablet Take 1 tablet (50 mcg total) by mouth daily.  30 tablet  0   No current facility-administered medications for this visit.    History   Social History  . Marital Status: Married    Spouse Name: N/A    Number of Children: 2  . Years of Education: N/A   Occupational History  .  Unemployed   Social History Main Topics  . Smoking status: Never Smoker   . Smokeless tobacco: Not on file  . Alcohol Use: No  . Drug Use: No  . Sexual Activity: No   Other Topics Concern  . Not on file   Social History Narrative  . No narrative on file    Family History  Problem Relation Age of Onset  . Hypothyroidism Mother   . Heart disease Mother   . Ovarian cancer Mother 74    High grade serous ovarian cancer  . Heart disease Father   . Heart attack Father   . Hypothyroidism Sister   . Breast cancer Maternal Aunt 67  . Diabetes Maternal Grandfather   . Diabetes Paternal Grandmother   . Cancer Other     maternal grandmother's sister had an "abdominal" cancer in her 66s  . Cancer Other     maternal grandmother's mother had "abdominal cancer"      Alvino Chapel, MD 01/28/2014, 2:38 PM

## 2014-01-28 NOTE — Patient Instructions (Signed)

## 2014-01-29 LAB — CA 125: CA 125: 7.9 U/mL (ref 0.0–30.2)

## 2014-02-01 ENCOUNTER — Other Ambulatory Visit: Payer: Self-pay | Admitting: Oncology

## 2014-02-09 ENCOUNTER — Telehealth: Payer: Self-pay | Admitting: Oncology

## 2014-02-09 NOTE — Telephone Encounter (Signed)
, °

## 2014-02-24 ENCOUNTER — Telehealth: Payer: Self-pay | Admitting: *Deleted

## 2014-02-24 NOTE — Telephone Encounter (Signed)
Returned pt call gave CA 125 results

## 2014-03-14 ENCOUNTER — Telehealth: Payer: Self-pay | Admitting: Oncology

## 2014-03-14 NOTE — Telephone Encounter (Signed)
, °

## 2014-03-17 ENCOUNTER — Ambulatory Visit (INDEPENDENT_AMBULATORY_CARE_PROVIDER_SITE_OTHER): Payer: 59 | Admitting: Psychology

## 2014-03-17 DIAGNOSIS — F4323 Adjustment disorder with mixed anxiety and depressed mood: Secondary | ICD-10-CM

## 2014-03-25 ENCOUNTER — Ambulatory Visit (HOSPITAL_BASED_OUTPATIENT_CLINIC_OR_DEPARTMENT_OTHER): Payer: 59

## 2014-03-25 VITALS — BP 107/52 | HR 53 | Temp 97.4°F

## 2014-03-25 DIAGNOSIS — Z452 Encounter for adjustment and management of vascular access device: Secondary | ICD-10-CM

## 2014-03-25 DIAGNOSIS — Z95828 Presence of other vascular implants and grafts: Secondary | ICD-10-CM

## 2014-03-25 DIAGNOSIS — C569 Malignant neoplasm of unspecified ovary: Secondary | ICD-10-CM

## 2014-03-25 MED ORDER — HEPARIN SOD (PORK) LOCK FLUSH 100 UNIT/ML IV SOLN
500.0000 [IU] | Freq: Once | INTRAVENOUS | Status: AC
  Administered 2014-03-25: 500 [IU] via INTRAVENOUS
  Filled 2014-03-25: qty 5

## 2014-03-25 MED ORDER — SODIUM CHLORIDE 0.9 % IJ SOLN
10.0000 mL | INTRAMUSCULAR | Status: DC | PRN
  Administered 2014-03-25: 10 mL via INTRAVENOUS
  Filled 2014-03-25: qty 10

## 2014-03-25 NOTE — Patient Instructions (Signed)

## 2014-03-29 ENCOUNTER — Ambulatory Visit (INDEPENDENT_AMBULATORY_CARE_PROVIDER_SITE_OTHER): Payer: 59 | Admitting: Psychology

## 2014-03-29 DIAGNOSIS — F4323 Adjustment disorder with mixed anxiety and depressed mood: Secondary | ICD-10-CM

## 2014-04-12 ENCOUNTER — Ambulatory Visit (INDEPENDENT_AMBULATORY_CARE_PROVIDER_SITE_OTHER): Payer: 59 | Admitting: Psychology

## 2014-04-12 DIAGNOSIS — F4323 Adjustment disorder with mixed anxiety and depressed mood: Secondary | ICD-10-CM

## 2014-05-02 ENCOUNTER — Other Ambulatory Visit: Payer: Self-pay

## 2014-05-02 DIAGNOSIS — Z1239 Encounter for other screening for malignant neoplasm of breast: Secondary | ICD-10-CM

## 2014-05-10 ENCOUNTER — Ambulatory Visit (INDEPENDENT_AMBULATORY_CARE_PROVIDER_SITE_OTHER): Payer: 59 | Admitting: Psychology

## 2014-05-10 DIAGNOSIS — F4323 Adjustment disorder with mixed anxiety and depressed mood: Secondary | ICD-10-CM

## 2014-05-16 ENCOUNTER — Ambulatory Visit (HOSPITAL_BASED_OUTPATIENT_CLINIC_OR_DEPARTMENT_OTHER): Payer: 59 | Admitting: Oncology

## 2014-05-16 ENCOUNTER — Other Ambulatory Visit (HOSPITAL_BASED_OUTPATIENT_CLINIC_OR_DEPARTMENT_OTHER): Payer: 59

## 2014-05-16 ENCOUNTER — Encounter: Payer: Self-pay | Admitting: Oncology

## 2014-05-16 ENCOUNTER — Ambulatory Visit (HOSPITAL_BASED_OUTPATIENT_CLINIC_OR_DEPARTMENT_OTHER): Payer: 59

## 2014-05-16 VITALS — BP 107/40 | HR 53 | Temp 98.2°F | Resp 20 | Ht 66.0 in | Wt 172.7 lb

## 2014-05-16 DIAGNOSIS — C561 Malignant neoplasm of right ovary: Secondary | ICD-10-CM

## 2014-05-16 DIAGNOSIS — Z95828 Presence of other vascular implants and grafts: Secondary | ICD-10-CM

## 2014-05-16 DIAGNOSIS — C569 Malignant neoplasm of unspecified ovary: Secondary | ICD-10-CM

## 2014-05-16 LAB — COMPREHENSIVE METABOLIC PANEL (CC13)
ALT: 17 U/L (ref 0–55)
ANION GAP: 8 meq/L (ref 3–11)
AST: 18 U/L (ref 5–34)
Albumin: 4 g/dL (ref 3.5–5.0)
Alkaline Phosphatase: 62 U/L (ref 40–150)
BILIRUBIN TOTAL: 0.41 mg/dL (ref 0.20–1.20)
BUN: 16 mg/dL (ref 7.0–26.0)
CALCIUM: 9.3 mg/dL (ref 8.4–10.4)
CHLORIDE: 108 meq/L (ref 98–109)
CO2: 27 meq/L (ref 22–29)
CREATININE: 1.1 mg/dL (ref 0.6–1.1)
GLUCOSE: 106 mg/dL (ref 70–140)
Potassium: 3.8 mEq/L (ref 3.5–5.1)
Sodium: 143 mEq/L (ref 136–145)
Total Protein: 6.6 g/dL (ref 6.4–8.3)

## 2014-05-16 LAB — CBC WITH DIFFERENTIAL/PLATELET
BASO%: 0.7 % (ref 0.0–2.0)
Basophils Absolute: 0 10*3/uL (ref 0.0–0.1)
EOS ABS: 0.2 10*3/uL (ref 0.0–0.5)
EOS%: 5.3 % (ref 0.0–7.0)
HEMATOCRIT: 35.9 % (ref 34.8–46.6)
HGB: 11.7 g/dL (ref 11.6–15.9)
LYMPH%: 37.1 % (ref 14.0–49.7)
MCH: 30.5 pg (ref 25.1–34.0)
MCHC: 32.4 g/dL (ref 31.5–36.0)
MCV: 94.2 fL (ref 79.5–101.0)
MONO#: 0.3 10*3/uL (ref 0.1–0.9)
MONO%: 9.1 % (ref 0.0–14.0)
NEUT#: 1.7 10*3/uL (ref 1.5–6.5)
NEUT%: 47.8 % (ref 38.4–76.8)
Platelets: 163 10*3/uL (ref 145–400)
RBC: 3.81 10*6/uL (ref 3.70–5.45)
RDW: 12.2 % (ref 11.2–14.5)
WBC: 3.5 10*3/uL — ABNORMAL LOW (ref 3.9–10.3)
lymph#: 1.3 10*3/uL (ref 0.9–3.3)

## 2014-05-16 MED ORDER — SODIUM CHLORIDE 0.9 % IJ SOLN
10.0000 mL | INTRAMUSCULAR | Status: DC | PRN
  Administered 2014-05-16: 10 mL via INTRAVENOUS
  Filled 2014-05-16: qty 10

## 2014-05-16 MED ORDER — HEPARIN SOD (PORK) LOCK FLUSH 100 UNIT/ML IV SOLN
500.0000 [IU] | Freq: Once | INTRAVENOUS | Status: AC
  Administered 2014-05-16: 500 [IU] via INTRAVENOUS
  Filled 2014-05-16: qty 5

## 2014-05-16 NOTE — Patient Instructions (Signed)

## 2014-05-16 NOTE — Progress Notes (Signed)
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 She saw Dr Josephina Shih 01-28-14 and will see him again in ~ 3 months. Last imaging was CT AP 06-2013.  Patient has generally been doing well, with only minimal taxol neuropathy residual in toes, but able to wear all types of shoes again; she has no neuropathy in fingers. Weakness LLE following surgery and zoster resolved. She has ongoing GI problems from gluten and lactose intolerance, some concern that she may actually have celiac disease. She notices pressure in low abdomen when bowels need to move, improved after BM. She has some joint stiffness knees, hips, low back improved with activity.   She has PAC, flushed today. She has preferred to keep this for now She received flu vaccine today.  ONCOLOGIC HISTORY Patient presented with RUQ pain in March 2014, with ED evaluation then including abdominal US not remarkable. She was then found to have pelvic mass, with MRI pelvis in [place] system 11-11-12 showing centrally necrotic pelvic mass 11.4 x 9.1 x 7.8 cm. CA 125 by Dr Dellis Filbert 11-13-2012 was 226.4. She saw Dr Josephina Shih in [place] on 11-17-12 and went to surgery by him at [facility] on 11-24-12. Surgery was complete debulking including TAH, BSO, omentectomy, right ureterolysis, bilateral pelvic lymphadenectomy and periaortic lymphadenectomy. There is no mention in the operative note of rupture of the ovarian capsule. Pathology [phone] from 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 Pelvic washings ([id number]) negative. Patient was hospitalized at [facility] x 5 days; per patient and husband, she was transfused 2 units PRBCs at [facility]. She developed acute herpes zoster left T 11-12 dermatome on 12-12-12, then severe pain left inguinal region and upper lateral left thigh such that start of dose dense adjuvant taxol carboplatin was delayed x2 prior to beginning 01-05-13. She required neupogen x 1-2 days after each treatment, and carbo dose decreased to AUC =5 with cycle 2 and to AUC=3 with cycle 6 due to thrombocytopenia. Cycle 6 was completed 05-11-2013 Note preoperative CA 125 on 11-13-12 was 226 and was 17.6 on 04-20-13.  Review of systems as above, also: No fever or symptoms of infection. Good energy. No respiratory symptoms. No LE swelling. No bleeding Remainder of 10 point Review of Systems negative.  Objective:  Vital signs in last 24 hours:  BP 107/40 mmHg  Pulse 53  Temp(Src) 98.2 F (36.8 C) (Oral)  Resp 20  Ht 5\' 6"  (1.676 m)  Wt 172 lb 11.2 oz (78.336 kg)  BMI 27.89 kg/m2  LMP 10/25/2012 Weight is down 5 lbs. Alert, oriented and appropriate. Ambulatory without difficulty, wearing clogs No alopecia  HEENT:PERRL, sclerae not icteric. Oral mucosa moist without lesions, posterior pharynx clear.  Neck supple. No JVD.  Lymphatics:no cervical,suraclavicular, axillary or inguinal adenopathy Resp: clear to auscultation bilaterally and normal percussion bilaterally Cardio: regular rate and rhythm. No gallop. GI: soft, nontender, not distended, no mass or organomegaly. Normally active bowel sounds. Surgical incision not remarkable. Musculoskeletal/ Extremities: without pitting edema, cords, tenderness Neuro: peripheral neuropathy as noted. Otherwise nonfocal. PSYCH appropriate  mood and affect Skin without rash, ecchymosis, petechiae Breasts: without dominant mass, skin or nipple findings. Axillae benign. Portacath-without  erythema or tenderness  Lab Results:  Results for orders placed or performed in visit on 05/16/14  CBC with Differential  Result Value Ref Range   WBC 3.5 (L) 3.9 - 10.3 10e3/uL   NEUT# 1.7 1.5 - 6.5 10e3/uL   HGB 11.7 11.6 - 15.9 g/dL   HCT 35.9 34.8 - 46.6 %   Platelets 163 145 - 400 10e3/uL   MCV 94.2 79.5 - 101.0 fL   MCH 30.5 25.1 - 34.0 pg   MCHC 32.4 31.5 - 36.0 g/dL   RBC 3.81 3.70 - 5.45 10e6/uL   RDW 12.2 11.2 - 14.5 %   lymph# 1.3 0.9 - 3.3 10e3/uL   MONO# 0.3 0.1 - 0.9 10e3/uL   Eosinophils Absolute 0.2 0.0 - 0.5 10e3/uL   Basophils Absolute 0.0 0.0 - 0.1 10e3/uL   NEUT% 47.8 38.4 - 76.8 %   LYMPH% 37.1 14.0 - 49.7 %   MONO% 9.1 0.0 - 14.0 %   EOS% 5.3 0.0 - 7.0 %   BASO% 0.7 0.0 - 2.0 %    CMET available after visit entirely normal  CA125 available after visit by new lab method 8, and by old lab method 6.7, having been 7.9 by old method 01-28-14 and 8.1 12-2013.   Studies/Results:  No results found.  Medications: I have reviewed the patient's current medications.  DISCUSSION: Patient plans to scheduled back to PCP to consider further evaluation for celiac disease. 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 She will see Dr Josephina Shih in ~ 3 months and I will see her after than, coordinating with q 2 month PAC flushes. 2.herpes zoster just prior to start of chemotherapy, resolved 3. Left LE weakness since surgery and zoster, essentially resolved. 6.hypothyroid on replacement  3.lactose and gluten intolerance:intermittent abdominal/ GI symptoms seem related to this 4.PAC in, placed at [facility]. Needs to be flushed every 6-8 weeks when not used otherwise.  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 Used primarily phenergan for nausea during chemo  7.flu vaccine done  All questions answered and she knows to call prior to next scheduled appointment if needed. [place], MD   05/16/2014, 3:37 PM

## 2014-05-17 LAB — CA 125: CA 125: 8 U/mL (ref <35)

## 2014-05-17 LAB — CA 125(PREVIOUS METHOD): CA 125: 6.7 U/mL (ref 0.0–30.2)

## 2014-05-19 ENCOUNTER — Other Ambulatory Visit: Payer: Self-pay

## 2014-05-19 ENCOUNTER — Ambulatory Visit
Admission: RE | Admit: 2014-05-19 | Discharge: 2014-05-19 | Disposition: A | Discharge status: Home / Self Care | Payer: 59 | Source: Ambulatory Visit

## 2014-05-19 ENCOUNTER — Telehealth: Payer: Self-pay

## 2014-05-19 DIAGNOSIS — Z1231 Encounter for screening mammogram for malignant neoplasm of breast: Secondary | ICD-10-CM

## 2014-05-19 DIAGNOSIS — Z1239 Encounter for other screening for malignant neoplasm of breast: Secondary | ICD-10-CM

## 2014-05-19 NOTE — Telephone Encounter (Signed)
-----   Message from Gordy Levan, MD sent at 05/17/2014  3:18 PM EST ----- Labs seen and need follow up: please let her know CA 125 in good low range at 8

## 2014-05-19 NOTE — Telephone Encounter (Signed)
Spoke with Candice Zamora and told her the results of her CA-125 as noted below by Dr. Marko Plume.

## 2014-05-24 ENCOUNTER — Ambulatory Visit (INDEPENDENT_AMBULATORY_CARE_PROVIDER_SITE_OTHER): Payer: 59 | Admitting: Psychology

## 2014-05-24 DIAGNOSIS — F4323 Adjustment disorder with mixed anxiety and depressed mood: Secondary | ICD-10-CM

## 2014-06-07 ENCOUNTER — Ambulatory Visit (INDEPENDENT_AMBULATORY_CARE_PROVIDER_SITE_OTHER): Payer: 59 | Admitting: Psychology

## 2014-06-07 DIAGNOSIS — F4323 Adjustment disorder with mixed anxiety and depressed mood: Secondary | ICD-10-CM

## 2014-06-20 ENCOUNTER — Telehealth: Payer: Self-pay | Admitting: Oncology

## 2014-06-20 NOTE — Telephone Encounter (Signed)
lvm for pt regarding to April 2016 appt d.t change due to MD request....mailed pt appt sched and letter

## 2014-06-30 ENCOUNTER — Ambulatory Visit (INDEPENDENT_AMBULATORY_CARE_PROVIDER_SITE_OTHER): Payer: 59 | Admitting: Psychology

## 2014-06-30 DIAGNOSIS — F4323 Adjustment disorder with mixed anxiety and depressed mood: Secondary | ICD-10-CM

## 2014-07-11 ENCOUNTER — Ambulatory Visit (HOSPITAL_BASED_OUTPATIENT_CLINIC_OR_DEPARTMENT_OTHER): Payer: 59

## 2014-07-11 VITALS — BP 99/54 | HR 54 | Temp 97.9°F

## 2014-07-11 DIAGNOSIS — Z452 Encounter for adjustment and management of vascular access device: Secondary | ICD-10-CM

## 2014-07-11 DIAGNOSIS — Z95828 Presence of other vascular implants and grafts: Secondary | ICD-10-CM

## 2014-07-11 DIAGNOSIS — C561 Malignant neoplasm of right ovary: Secondary | ICD-10-CM

## 2014-07-11 MED ORDER — SODIUM CHLORIDE 0.9 % IJ SOLN
10.0000 mL | INTRAMUSCULAR | Status: DC | PRN
  Administered 2014-07-11: 10 mL via INTRAVENOUS
  Filled 2014-07-11: qty 10

## 2014-07-11 MED ORDER — HEPARIN SOD (PORK) LOCK FLUSH 100 UNIT/ML IV SOLN
500.0000 [IU] | Freq: Once | INTRAVENOUS | Status: AC
  Administered 2014-07-11: 500 [IU] via INTRAVENOUS
  Filled 2014-07-11: qty 5

## 2014-07-11 NOTE — Patient Instructions (Signed)

## 2014-07-26 ENCOUNTER — Ambulatory Visit: Payer: 59 | Admitting: Psychology

## 2014-08-11 ENCOUNTER — Other Ambulatory Visit (HOSPITAL_COMMUNITY): Payer: Self-pay | Admitting: Family Medicine

## 2014-08-11 ENCOUNTER — Ambulatory Visit (INDEPENDENT_AMBULATORY_CARE_PROVIDER_SITE_OTHER): Payer: 59 | Admitting: Psychology

## 2014-08-11 DIAGNOSIS — M129 Arthropathy, unspecified: Secondary | ICD-10-CM

## 2014-08-11 DIAGNOSIS — C569 Malignant neoplasm of unspecified ovary: Secondary | ICD-10-CM

## 2014-08-11 DIAGNOSIS — F4323 Adjustment disorder with mixed anxiety and depressed mood: Secondary | ICD-10-CM

## 2014-08-17 ENCOUNTER — Encounter (HOSPITAL_COMMUNITY)
Admission: RE | Admit: 2014-08-17 | Discharge: 2014-08-17 | Disposition: A | Discharge status: Home / Self Care | Payer: 59 | Source: Ambulatory Visit | Attending: Family Medicine | Admitting: Family Medicine

## 2014-08-17 DIAGNOSIS — C569 Malignant neoplasm of unspecified ovary: Secondary | ICD-10-CM | POA: Diagnosis present

## 2014-08-17 DIAGNOSIS — M129 Arthropathy, unspecified: Secondary | ICD-10-CM | POA: Diagnosis not present

## 2014-08-17 MED ORDER — TECHNETIUM TC 99M MEDRONATE IV KIT
25.0000 | PACK | Freq: Once | INTRAVENOUS | Status: AC | PRN
  Administered 2014-08-17: 25 via INTRAVENOUS

## 2014-08-26 ENCOUNTER — Ambulatory Visit (HOSPITAL_BASED_OUTPATIENT_CLINIC_OR_DEPARTMENT_OTHER): Payer: 59

## 2014-08-26 ENCOUNTER — Other Ambulatory Visit: Payer: 59

## 2014-08-26 ENCOUNTER — Telehealth: Payer: Self-pay | Admitting: Gynecologic Oncology

## 2014-08-26 ENCOUNTER — Ambulatory Visit: Payer: 59 | Attending: Gynecology | Admitting: Gynecology

## 2014-08-26 ENCOUNTER — Encounter: Payer: Self-pay | Admitting: Gynecology

## 2014-08-26 VITALS — HR 70 | Temp 98.5°F | Resp 18 | Ht 66.0 in | Wt 172.5 lb

## 2014-08-26 DIAGNOSIS — Z9221 Personal history of antineoplastic chemotherapy: Secondary | ICD-10-CM | POA: Insufficient documentation

## 2014-08-26 DIAGNOSIS — Z90710 Acquired absence of both cervix and uterus: Secondary | ICD-10-CM | POA: Insufficient documentation

## 2014-08-26 DIAGNOSIS — C569 Malignant neoplasm of unspecified ovary: Secondary | ICD-10-CM

## 2014-08-26 DIAGNOSIS — Z95828 Presence of other vascular implants and grafts: Secondary | ICD-10-CM

## 2014-08-26 DIAGNOSIS — C561 Malignant neoplasm of right ovary: Secondary | ICD-10-CM

## 2014-08-26 DIAGNOSIS — Z08 Encounter for follow-up examination after completed treatment for malignant neoplasm: Secondary | ICD-10-CM | POA: Diagnosis present

## 2014-08-26 DIAGNOSIS — Z8543 Personal history of malignant neoplasm of ovary: Secondary | ICD-10-CM | POA: Insufficient documentation

## 2014-08-26 DIAGNOSIS — Z90722 Acquired absence of ovaries, bilateral: Secondary | ICD-10-CM | POA: Insufficient documentation

## 2014-08-26 DIAGNOSIS — E039 Hypothyroidism, unspecified: Secondary | ICD-10-CM | POA: Diagnosis not present

## 2014-08-26 DIAGNOSIS — Z452 Encounter for adjustment and management of vascular access device: Secondary | ICD-10-CM

## 2014-08-26 MED ORDER — SODIUM CHLORIDE 0.9 % IJ SOLN
10.0000 mL | INTRAMUSCULAR | Status: DC | PRN
  Administered 2014-08-26: 10 mL via INTRAVENOUS
  Filled 2014-08-26: qty 10

## 2014-08-26 MED ORDER — HEPARIN SOD (PORK) LOCK FLUSH 100 UNIT/ML IV SOLN
500.0000 [IU] | Freq: Once | INTRAVENOUS | Status: AC
  Administered 2014-08-26: 500 [IU] via INTRAVENOUS
  Filled 2014-08-26: qty 5

## 2014-08-26 NOTE — Patient Instructions (Signed)
Follow up in 3 months

## 2014-08-26 NOTE — Progress Notes (Signed)
Consult Note: Gyn-Onc   Candice Zamora 49 y.o. female  Chief Complaint  Patient presents with  . 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 Plan:  Patient returns see me in 3 months. CA-125 is pending. Port-A-Cath was flushed.   Interval history The patient returns today as previously scheduled. She completed 6 cycles of adjuvant dose dense carboplatin and Taxol chemotherapy. Followup CT scan on 06/15/2013 shows no evidence of disease.     Appetite is good she has no GI or GU symptoms. She denies any pelvic symptoms.  Her functional status is excellent and she is working full time. Her only problem is weakness in her left leg is undergoing evaluation.    HPI:  37 her old white married female gravida 4 para 2 seen in consultation request of Dr.Marie-Lyne Dellis Zamora regarding management of a newly diagnosed complex pelvic mass. The patient has had several months of lower nominal pain and initially underwent a pelvic ultrasound on April 28 that showed a 10.8 x 8.7 x 9.2 cm heterogeneous pelvic mass. Small fibroids were also noted. The adnexa were poorly seen. Subsequently she underwent a MRI of the pelvis which demonstrated a centrally necrotic pelvic mass which may arise from the posterior uterus or the right adnexa. She had a normal-appearing left ovary moderate ascites and no evidence of adenopathy. CA 125 is 226 units per mL CEA 1.5 (normal)OVA1 was 9.4 (upper limits of normal for premenopausal patient is 5) Patient underwent exploratory laparotomy, TAH, BSO, omentectomy, pelvic and aortic lymphadenectomy at [facility] on 11/24/2012. She had 11 x 9 x 7 cm right adnexal mass which was densely adherent to the right pelvic sidewall (stage IIB). 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 All other biopsies including pelvic and para-aortic lymphadenectomy were negative.  Postoperatively she was treated with dose dense carboplatin and  Taxol. For 6 cycles. CT scan the end of therapy on 06/15/2013 was normal and CA 125 is 17 units per mL.   Review of Systems:10 point review of systems is negative except as noted in interval history.   Vitals: Pulse 70, temperature 98.5 F (36.9 C), temperature source Oral, resp. rate 18, height 5\' 6"  (1.676 m), weight 172 lb 8 oz (78.245 kg), last menstrual period 10/25/2012.  Physical Exam: General : The patient is a healthy woman in no acute distress.  HEENT: normocephalic, extraoccular movements normal; neck is supple without thyromegally  Lynphnodes: Supraclavicular and inguinal nodes not enlarged  Abdomen: Soft, non-tender, no ascites, no organomegally., no hernias, incision is well-healed Pelvic:  EGBUS: Normal female  Vagina: Normal, no lesions , the cuff is healed. Urethra and Bladder: Normal, non-tender  Cervix: Surgically absent   Uterus: I surgically absent   Rectal: normal sphincter ton, confirmed pelvic mass., no blood  Lower extremities: No edema or varicosities. Normal range of motion      Allergies  Allergen Reactions  . Compazine [Prochlorperazine Maleate] Other (See Comments)    Jittery  . Gluten Meal     Not celiac but pt. Does not tolerate gluten in meals.  . Lactase Swelling  . Soybean Oil Swelling  . Zofran [Ondansetron Hcl] Other (See Comments)    headache  . Sulfa Antibiotics Rash    Past Medical History  Diagnosis Date  . Ectopic pregnancy   . History of chicken pox   . Pelvic mass in female   . Abdominal pain   . Infertility, female   .  Hemorrhoids   . Goiter   . Hypothyroidism   . Dermoid cyst   . Ovarian cancer 2014    serous ovarian cancer    Past Surgical History  Procedure Laterality Date  . Laparoscopy for ectopic pregnancy      10 years ago    Current Outpatient Prescriptions  Medication Sig Dispense Refill  . levothyroxine (SYNTHROID, LEVOTHROID) 50 MCG tablet Take 1 tablet (50 mcg total) by mouth daily. 30 tablet 0    No current facility-administered medications for this visit.   Facility-Administered Medications Ordered in Other Visits  Medication Dose Route Frequency Provider Last Rate Last Dose  . sodium chloride 0.9 % injection 10 mL  10 mL Intravenous PRN Lennis Marion Downer, MD   10 mL at 08/26/14 0857    History   Social History  . Marital Status: Married    Spouse Name: N/A  . Number of Children: 2  . Years of Education: N/A   Occupational History  .  Unemployed   Social History Main Topics  . Smoking status: Never Smoker   . Smokeless tobacco: Not on file  . Alcohol Use: No  . Drug Use: No  . Sexual Activity: No   Other Topics Concern  . Not on file   Social History Narrative    Family History  Problem Relation Age of Onset  . Hypothyroidism Mother   . Heart disease Mother   . Ovarian cancer Mother 11    High grade serous ovarian cancer  . Heart disease Father   . Heart attack Father   . Hypothyroidism Sister   . Breast cancer Maternal Aunt 67  . Diabetes Maternal Grandfather   . Diabetes Paternal Grandmother   . Cancer Other     maternal grandmother's sister had an "abdominal" cancer in her 59s  . Cancer Other     maternal grandmother's mother had "abdominal cancer"      CLARKE-PEARSON,Shabnam Ladd L, MD 08/26/2014, 9:26 AM

## 2014-08-26 NOTE — Telephone Encounter (Signed)
Informed of Ca 125 results.  Advised to call for any questions or concerns.

## 2014-08-27 LAB — CA 125: CA 125: 9 U/mL (ref <35)

## 2014-08-27 LAB — CA 125(PREVIOUS METHOD): CA 125: 7.3 U/mL (ref 0.0–30.2)

## 2014-09-01 ENCOUNTER — Ambulatory Visit (INDEPENDENT_AMBULATORY_CARE_PROVIDER_SITE_OTHER): Payer: 59 | Admitting: Psychology

## 2014-09-01 DIAGNOSIS — F4323 Adjustment disorder with mixed anxiety and depressed mood: Secondary | ICD-10-CM

## 2014-09-22 ENCOUNTER — Ambulatory Visit (INDEPENDENT_AMBULATORY_CARE_PROVIDER_SITE_OTHER): Payer: 59 | Admitting: Psychology

## 2014-09-22 DIAGNOSIS — F4323 Adjustment disorder with mixed anxiety and depressed mood: Secondary | ICD-10-CM

## 2014-10-13 ENCOUNTER — Ambulatory Visit: Payer: 59 | Admitting: Psychology

## 2014-10-20 ENCOUNTER — Ambulatory Visit (INDEPENDENT_AMBULATORY_CARE_PROVIDER_SITE_OTHER): Payer: 59 | Admitting: Psychology

## 2014-10-20 DIAGNOSIS — F4322 Adjustment disorder with anxiety: Secondary | ICD-10-CM | POA: Diagnosis not present

## 2014-10-26 ENCOUNTER — Other Ambulatory Visit: Payer: Self-pay

## 2014-10-26 ENCOUNTER — Ambulatory Visit: Payer: Self-pay | Admitting: Oncology

## 2014-10-26 DIAGNOSIS — C569 Malignant neoplasm of unspecified ovary: Secondary | ICD-10-CM

## 2014-10-27 ENCOUNTER — Encounter: Payer: Self-pay | Admitting: Oncology

## 2014-10-27 ENCOUNTER — Telehealth: Payer: Self-pay

## 2014-10-27 ENCOUNTER — Other Ambulatory Visit: Payer: Self-pay | Admitting: Oncology

## 2014-10-27 ENCOUNTER — Ambulatory Visit (HOSPITAL_BASED_OUTPATIENT_CLINIC_OR_DEPARTMENT_OTHER): Payer: 59

## 2014-10-27 ENCOUNTER — Other Ambulatory Visit (HOSPITAL_BASED_OUTPATIENT_CLINIC_OR_DEPARTMENT_OTHER): Payer: 59

## 2014-10-27 ENCOUNTER — Telehealth: Payer: Self-pay | Admitting: Oncology

## 2014-10-27 ENCOUNTER — Ambulatory Visit (HOSPITAL_BASED_OUTPATIENT_CLINIC_OR_DEPARTMENT_OTHER): Payer: 59 | Admitting: Oncology

## 2014-10-27 VITALS — BP 90/46 | HR 62 | Temp 98.2°F | Resp 18 | Ht 66.0 in | Wt 170.3 lb

## 2014-10-27 DIAGNOSIS — C561 Malignant neoplasm of right ovary: Secondary | ICD-10-CM

## 2014-10-27 DIAGNOSIS — G620 Drug-induced polyneuropathy: Secondary | ICD-10-CM | POA: Diagnosis not present

## 2014-10-27 DIAGNOSIS — T451X5A Adverse effect of antineoplastic and immunosuppressive drugs, initial encounter: Secondary | ICD-10-CM

## 2014-10-27 DIAGNOSIS — Z1231 Encounter for screening mammogram for malignant neoplasm of breast: Secondary | ICD-10-CM

## 2014-10-27 DIAGNOSIS — E039 Hypothyroidism, unspecified: Secondary | ICD-10-CM

## 2014-10-27 DIAGNOSIS — C569 Malignant neoplasm of unspecified ovary: Secondary | ICD-10-CM

## 2014-10-27 DIAGNOSIS — Z452 Encounter for adjustment and management of vascular access device: Secondary | ICD-10-CM

## 2014-10-27 DIAGNOSIS — Z95828 Presence of other vascular implants and grafts: Secondary | ICD-10-CM

## 2014-10-27 LAB — CBC WITH DIFFERENTIAL/PLATELET
BASO%: 0.6 % (ref 0.0–2.0)
Basophils Absolute: 0 10*3/uL (ref 0.0–0.1)
EOS%: 2.7 % (ref 0.0–7.0)
Eosinophils Absolute: 0.1 10*3/uL (ref 0.0–0.5)
HCT: 35.8 % (ref 34.8–46.6)
HGB: 11.8 g/dL (ref 11.6–15.9)
LYMPH%: 36.4 % (ref 14.0–49.7)
MCH: 30.9 pg (ref 25.1–34.0)
MCHC: 33 g/dL (ref 31.5–36.0)
MCV: 93.6 fL (ref 79.5–101.0)
MONO#: 0.3 10*3/uL (ref 0.1–0.9)
MONO%: 7.4 % (ref 0.0–14.0)
NEUT%: 52.9 % (ref 38.4–76.8)
NEUTROS ABS: 2.1 10*3/uL (ref 1.5–6.5)
PLATELETS: 175 10*3/uL (ref 145–400)
RBC: 3.83 10*6/uL (ref 3.70–5.45)
RDW: 12.2 % (ref 11.2–14.5)
WBC: 3.9 10*3/uL (ref 3.9–10.3)
lymph#: 1.4 10*3/uL (ref 0.9–3.3)

## 2014-10-27 LAB — COMPREHENSIVE METABOLIC PANEL (CC13)
ALT: 15 U/L (ref 0–55)
ANION GAP: 6 meq/L (ref 3–11)
AST: 20 U/L (ref 5–34)
Albumin: 4.1 g/dL (ref 3.5–5.0)
Alkaline Phosphatase: 62 U/L (ref 40–150)
BUN: 18.7 mg/dL (ref 7.0–26.0)
CALCIUM: 9.4 mg/dL (ref 8.4–10.4)
CHLORIDE: 107 meq/L (ref 98–109)
CO2: 29 meq/L (ref 22–29)
Creatinine: 1 mg/dL (ref 0.6–1.1)
EGFR: 71 mL/min/{1.73_m2} — ABNORMAL LOW (ref >90)
GLUCOSE: 97 mg/dL (ref 70–140)
Potassium: 3.8 mEq/L (ref 3.5–5.1)
Sodium: 142 mEq/L (ref 136–145)
Total Bilirubin: 0.75 mg/dL (ref 0.20–1.20)
Total Protein: 6.8 g/dL (ref 6.4–8.3)

## 2014-10-27 MED ORDER — HEPARIN SOD (PORK) LOCK FLUSH 100 UNIT/ML IV SOLN
500.0000 [IU] | Freq: Once | INTRAVENOUS | Status: AC
  Administered 2014-10-27: 500 [IU] via INTRAVENOUS
  Filled 2014-10-27: qty 5

## 2014-10-27 MED ORDER — SODIUM CHLORIDE 0.9 % IJ SOLN
10.0000 mL | INTRAMUSCULAR | Status: DC | PRN
  Administered 2014-10-27: 10 mL via INTRAVENOUS
  Filled 2014-10-27: qty 10

## 2014-10-27 NOTE — Patient Instructions (Signed)

## 2014-10-27 NOTE — Telephone Encounter (Signed)
per pof to sch pt appt-gave pt copy of sch °

## 2014-10-27 NOTE — Progress Notes (Signed)
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 She saw Dr Josephina Shih in Feb and will see him again 12-09-14. Last imaging was CT AP 06-2013.   Patient has been doing well since she was here last, with no complaints that seem concerning from the oncologic history. Energy and appetite are good, bowels are moving normally and bladder is fine, no abdominal or pelvic pain, no N/V, no LE swelling, no bleeding. Peripheral neuropathy has nearly resolved.  She is to see Dr Kelton Pillar tomorrow; copy of CBC sent with patient now, and we will send CMET results by tomorrow AM if possible.  PAC is still in, flushed today Genetics testing negative 08-2013 CA 125 was elevated at 226 preoperatively 11-2012.  Children have both gotten into magnet schools for next year, K and 6th  ONCOLOGIC HISTORY Patient presented with RUQ pain in March 2014, with ED evaluation then including abdominal US not remarkable. She was then found to have pelvic mass, with MRI pelvis in [place] system 11-11-12 showing centrally necrotic pelvic mass 11.4 x 9.1 x 7.8 cm. CA 125 by Dr Dellis Filbert 11-13-2012 was 226.4. She saw Dr Josephina Shih in [place] on 11-17-12 and went to surgery by him at [facility] on 11-24-12. Surgery was complete debulking including TAH, BSO, omentectomy, right ureterolysis, bilateral pelvic lymphadenectomy and periaortic lymphadenectomy. There is no mention in the operative note of rupture of the ovarian capsule. 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 Pelvic washings ([id number]) negative. Patient was hospitalized at [facility] x 5 days; per patient and husband, she was transfused 2 units PRBCs at [facility]. She developed acute herpes zoster left T 11-12 dermatome on 12-12-12, then severe pain left inguinal region and upper lateral left thigh such that start of dose dense adjuvant taxol carboplatin was delayed x2 prior to beginning 01-05-13. She required neupogen x 1-2 days after each treatment, and carbo dose decreased to AUC =5 with cycle 2 and to AUC=3 with cycle 6 due to thrombocytopenia. Cycle 6 was completed 05-11-2013 Note preoperative CA 125 on 11-13-12 was 226 and was 17.6 on 04-20-13.    Review of systems as above, also: No recent illness. No SOB or other pulmonary symptoms. Low back pain ~ Feb 2016, with bone scan by Dr Laurann Montana, then discomfort completely resolved with PT by Dr Delilah Shan. No pain in left hip where some question on the bone scan Remainder of 10 point Review of Systems negative.  Objective:  Vital signs in last 24 hours:  BP 90/46 mmHg  Pulse 62  Temp(Src) 98.2 F (36.8 C) (Oral)  Resp 18  Ht 5\' 6"  (1.676 m)  Wt 170 lb 4.8 oz (77.248 kg)  BMI 27.50 kg/m2  LMP 10/25/2012 Weight down 2 lbs Alert, oriented and appropriate. Ambulatory without difficulty.   HEENT:PERRL, sclerae not icteric. Oral mucosa moist without lesions, posterior pharynx clear.  Neck supple. No JVD.  Lymphatics:no cervical,supraclavicular, axillary or inguinal adenopathy Resp: clear to auscultation bilaterally and normal percussion bilaterally Cardio: regular rate and rhythm. No gallop. GI: soft, nontender, not  distended, no mass or organomegaly. Normally active bowel sounds. Surgical incision not remarkable. Musculoskeletal/ Extremities: without pitting edema, cords, tenderness Neuro: no peripheral neuropathy. Otherwise nonfocal. PSYCH appropriate mood and affect Skin without rash, ecchymosis,  petechiae   Lab Results:  Results for orders placed or performed in visit on 10/27/14  CBC with Differential  Result Value Ref Range   WBC 3.9 3.9 - 10.3 10e3/uL   NEUT# 2.1 1.5 - 6.5 10e3/uL   HGB 11.8 11.6 - 15.9 g/dL   HCT 35.8 34.8 - 46.6 %   Platelets 175 145 - 400 10e3/uL   MCV 93.6 79.5 - 101.0 fL   MCH 30.9 25.1 - 34.0 pg   MCHC 33.0 31.5 - 36.0 g/dL   RBC 3.83 3.70 - 5.45 10e6/uL   RDW 12.2 11.2 - 14.5 %   lymph# 1.4 0.9 - 3.3 10e3/uL   MONO# 0.3 0.1 - 0.9 10e3/uL   Eosinophils Absolute 0.1 0.0 - 0.5 10e3/uL   Basophils Absolute 0.0 0.0 - 0.1 10e3/uL   NEUT% 52.9 38.4 - 76.8 %   LYMPH% 36.4 14.0 - 49.7 %   MONO% 7.4 0.0 - 14.0 %   EOS% 2.7 0.0 - 7.0 %   BASO% 0.6 0.0 - 2.0 %    CMET available after visit entirely normal CA 125 available after visit 7, this having been 9 in 08-2014  Studies/Results: EXAM: NUCLEAR MEDICINE WHOLE BODY BONE SCAN   08-17-14 Views: Whole-body  Radionuclide: Technetium 66m methylene diphosphonate  Dose: 27.3 mCi  Route of administration: Intravenous  COMPARISON: None  FINDINGS: Slight increased uptake in the mid lumbar spine is probably of arthropathic etiology. There is slight increased uptake in the superior left acetabulum compared to the right. Elsewhere, the distribution of uptake of radiotracer is normal. Kidneys are in the flank positions bilaterally.  IMPRESSION: Focal increased radiotracer uptake in the superior left acetabulum compared to the right. This finding may warrant radiographic correlation to further assess. Slight increased uptake in the mid lumbar spine is probably of arthropathic etiology, possibly exacerbated by lumbar lordosis. Study otherwise unremarkable.    [place] 05-19-14, scattered densities, year follow up appropriate   Medications: I have reviewed the patient's current medications.  DISCUSSION: continues to do well. We will let her know CA 125 result. She  prefers to keep the [facility] for now, tho could certainly consider removing this perhaps in fall, as she will be 2 years from completion of therapy then.  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 She will see Dr Josephina Shih in late May, with [facility] flush then, and I will try to coordinate my next visit with PAC flush ~ Sept. 2.herpes zoster just prior to start of chemotherapy, resolved 3. Left LE weakness since surgery and zoster, essentially resolved. 6.hypothyroid on replacement  3.lactose and gluten intolerance:intermittent abdominal/ GI symptoms seem related to this 4.PAC in, placed at [facility]. Needs to be flushed every 6-8 weeks when not used otherwise.  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 Used primarily phenergan for nausea during chemo  7.low back pain resolved with PT, including daily stretching exercises. Doubt bone scan findings significant as no symptoms otherwise.   All questions answered and she knows to call prior to next scheduled visit if needed.  Cc this note to Drs Laurann Montana and Delilah Shan. Time spent 15 min.   Sallee Hogrefe P, MD   10/27/2014, 3:03 PM

## 2014-10-27 NOTE — Telephone Encounter (Signed)
Faxed labs from  10-27-14 to Dr. Kelton Pillar for patient's visit tomorrow.

## 2014-10-28 LAB — CA 125: CA 125: 7 U/mL (ref <35)

## 2014-10-29 DIAGNOSIS — G620 Drug-induced polyneuropathy: Secondary | ICD-10-CM | POA: Insufficient documentation

## 2014-10-29 DIAGNOSIS — Z1231 Encounter for screening mammogram for malignant neoplasm of breast: Secondary | ICD-10-CM | POA: Insufficient documentation

## 2014-10-29 DIAGNOSIS — Z95828 Presence of other vascular implants and grafts: Secondary | ICD-10-CM | POA: Insufficient documentation

## 2014-10-29 DIAGNOSIS — C561 Malignant neoplasm of right ovary: Secondary | ICD-10-CM | POA: Insufficient documentation

## 2014-10-29 DIAGNOSIS — T451X5A Adverse effect of antineoplastic and immunosuppressive drugs, initial encounter: Secondary | ICD-10-CM

## 2014-10-30 ENCOUNTER — Other Ambulatory Visit: Payer: Self-pay | Admitting: Oncology

## 2014-10-30 DIAGNOSIS — C561 Malignant neoplasm of right ovary: Secondary | ICD-10-CM

## 2014-11-01 ENCOUNTER — Telehealth: Payer: Self-pay

## 2014-11-01 NOTE — Telephone Encounter (Signed)
-----   Message from Gordy Levan, MD sent at 10/28/2014  3:45 PM EDT ----- Labs seen and need follow up: please let her know all labs good, CA 125 in low normal range at 7

## 2014-11-01 NOTE — Telephone Encounter (Signed)
Left a message in Ms. Pressnell's work VM regarding the results of the CA-125 as noted below by Dr. Marko Plume.  She can call the [facility] if she has any questions.

## 2014-11-10 ENCOUNTER — Ambulatory Visit (INDEPENDENT_AMBULATORY_CARE_PROVIDER_SITE_OTHER): Payer: 59 | Admitting: Psychology

## 2014-11-10 DIAGNOSIS — F4323 Adjustment disorder with mixed anxiety and depressed mood: Secondary | ICD-10-CM | POA: Diagnosis not present

## 2014-12-01 ENCOUNTER — Ambulatory Visit (INDEPENDENT_AMBULATORY_CARE_PROVIDER_SITE_OTHER): Payer: 59 | Admitting: Psychology

## 2014-12-01 DIAGNOSIS — F4323 Adjustment disorder with mixed anxiety and depressed mood: Secondary | ICD-10-CM

## 2014-12-08 ENCOUNTER — Other Ambulatory Visit: Payer: Self-pay | Admitting: *Deleted

## 2014-12-08 DIAGNOSIS — C561 Malignant neoplasm of right ovary: Secondary | ICD-10-CM

## 2014-12-09 ENCOUNTER — Other Ambulatory Visit: Payer: Self-pay

## 2014-12-09 ENCOUNTER — Ambulatory Visit: Payer: 59 | Attending: Gynecology | Admitting: Gynecology

## 2014-12-27 ENCOUNTER — Telehealth: Payer: Self-pay | Admitting: *Deleted

## 2014-12-27 NOTE — Telephone Encounter (Signed)
Message left asking patient to call GYN Onc [phone] to reschedule her missed appointment

## 2014-12-30 ENCOUNTER — Ambulatory Visit (HOSPITAL_BASED_OUTPATIENT_CLINIC_OR_DEPARTMENT_OTHER): Payer: 59

## 2014-12-30 ENCOUNTER — Other Ambulatory Visit (HOSPITAL_BASED_OUTPATIENT_CLINIC_OR_DEPARTMENT_OTHER): Payer: 59

## 2014-12-30 VITALS — BP 93/57 | HR 50 | Temp 98.3°F | Resp 16

## 2014-12-30 DIAGNOSIS — Z452 Encounter for adjustment and management of vascular access device: Secondary | ICD-10-CM

## 2014-12-30 DIAGNOSIS — C561 Malignant neoplasm of right ovary: Secondary | ICD-10-CM

## 2014-12-30 DIAGNOSIS — Z95828 Presence of other vascular implants and grafts: Secondary | ICD-10-CM

## 2014-12-30 LAB — COMPREHENSIVE METABOLIC PANEL (CC13)
ALT: 19 U/L (ref 0–55)
AST: 20 U/L (ref 5–34)
Albumin: 4.2 g/dL (ref 3.5–5.0)
Alkaline Phosphatase: 59 U/L (ref 40–150)
Anion Gap: 7 mEq/L (ref 3–11)
BUN: 17.6 mg/dL (ref 7.0–26.0)
CO2: 29 mEq/L (ref 22–29)
Calcium: 9.6 mg/dL (ref 8.4–10.4)
Chloride: 105 mEq/L (ref 98–109)
Creatinine: 0.9 mg/dL (ref 0.6–1.1)
EGFR: 76 mL/min/{1.73_m2} — ABNORMAL LOW (ref >90)
Glucose: 82 mg/dl (ref 70–140)
Potassium: 4 mEq/L (ref 3.5–5.1)
Sodium: 142 mEq/L (ref 136–145)
Total Bilirubin: 0.69 mg/dL (ref 0.20–1.20)
Total Protein: 6.9 g/dL (ref 6.4–8.3)

## 2014-12-30 LAB — CBC WITH DIFFERENTIAL/PLATELET
BASO%: 0.5 % (ref 0.0–2.0)
Basophils Absolute: 0 10*3/uL (ref 0.0–0.1)
EOS ABS: 0.1 10*3/uL (ref 0.0–0.5)
EOS%: 3.3 % (ref 0.0–7.0)
HEMATOCRIT: 37.2 % (ref 34.8–46.6)
HGB: 12.2 g/dL (ref 11.6–15.9)
LYMPH%: 38 % (ref 14.0–49.7)
MCH: 30.8 pg (ref 25.1–34.0)
MCHC: 32.7 g/dL (ref 31.5–36.0)
MCV: 94.2 fL (ref 79.5–101.0)
MONO#: 0.3 10*3/uL (ref 0.1–0.9)
MONO%: 7.6 % (ref 0.0–14.0)
NEUT%: 50.6 % (ref 38.4–76.8)
NEUTROS ABS: 1.9 10*3/uL (ref 1.5–6.5)
PLATELETS: 177 10*3/uL (ref 145–400)
RBC: 3.94 10*6/uL (ref 3.70–5.45)
RDW: 12.3 % (ref 11.2–14.5)
WBC: 3.8 10*3/uL — AB (ref 3.9–10.3)
lymph#: 1.4 10*3/uL (ref 0.9–3.3)

## 2014-12-30 MED ORDER — SODIUM CHLORIDE 0.9 % IJ SOLN
10.0000 mL | INTRAMUSCULAR | Status: DC | PRN
  Administered 2014-12-30: 10 mL via INTRAVENOUS
  Filled 2014-12-30: qty 10

## 2014-12-30 MED ORDER — HEPARIN SOD (PORK) LOCK FLUSH 100 UNIT/ML IV SOLN
500.0000 [IU] | Freq: Once | INTRAVENOUS | Status: AC
  Administered 2014-12-30: 500 [IU] via INTRAVENOUS
  Filled 2014-12-30: qty 5

## 2014-12-30 NOTE — Patient Instructions (Signed)

## 2015-01-27 ENCOUNTER — Ambulatory Visit: Payer: 59 | Attending: Gynecology | Admitting: Gynecology

## 2015-01-27 ENCOUNTER — Encounter: Payer: Self-pay | Admitting: Gynecology

## 2015-01-27 ENCOUNTER — Other Ambulatory Visit: Payer: Self-pay

## 2015-01-27 VITALS — BP 96/60 | HR 60 | Temp 97.7°F | Resp 20 | Ht 66.0 in | Wt 167.5 lb

## 2015-01-27 DIAGNOSIS — Z8543 Personal history of malignant neoplasm of ovary: Secondary | ICD-10-CM

## 2015-01-27 DIAGNOSIS — C569 Malignant neoplasm of unspecified ovary: Secondary | ICD-10-CM | POA: Diagnosis present

## 2015-01-27 NOTE — Progress Notes (Signed)
Consult Note: Gyn-Onc   Candice Zamora 49 y.o. female  Chief Complaint  Patient presents with  . 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 Plan:  Patient returns see Dr. Marko Zamora in 3 months. At that point she will be 2 years since completing chemotherapy and we will stretch the visits out the 6 month intervals. I will plan on seeing the patient in 9 months. CA-125 will be obtained today.   Interval history The patient returns today as previously scheduled. She completed 6 cycles of adjuvant dose dense carboplatin and Taxol chemotherapy. Followup CT scan on 06/15/2013 shows no evidence of disease.     Appetite is good she has no GI or GU symptoms. She denies any pelvic symptoms.  Her functional status is excellent and she is working full time.. : CA 125 in April 2016 was 7 units per mL.    HPI:  6 her old white married female gravida 4 para 2 seen in consultation request of Dr.Marie-Lyne Dellis Zamora regarding management of a newly diagnosed complex pelvic mass. The patient has had several months of lower abdominal pain and initially underwent a pelvic ultrasound on November 09, 2012 that showed a 10.8 x 8.7 x 9.2 cm heterogeneous pelvic mass. Small fibroids were also noted. The adnexa were poorly seen. Subsequently she underwent a MRI of the pelvis which demonstrated a centrally necrotic pelvic mass which may arise from the posterior uterus or the right adnexa. She had a normal-appearing left ovary moderate ascites and no evidence of adenopathy. CA 125 was 226 units per mL CEA 1.5 (normal) OVA1 was 9.4 (upper limits of normal for premenopausal patient is 5) Patient underwent exploratory laparotomy, TAH, BSO, omentectomy, pelvic and aortic lymphadenectomy at [facility] on 11/24/2012. She had 11 x 9 x 7 cm right adnexal mass which was densely adherent to the right pelvic sidewall (stage IIB). 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 All  other biopsies including pelvic and para-aortic lymphadenectomy were negative.  Postoperatively she was treated with dose dense carboplatin and Taxol. For 6 cycles. CT scan the end of therapy on 06/15/2013 was normal and CA 125 is 17 units per mL.   Review of Systems:10 point review of systems is negative except as noted in interval history.   Vitals: Blood pressure 96/60, pulse 60, temperature 97.7 F (36.5 C), temperature source Oral, resp. rate 20, height 5\' 6"  (1.676 m), weight 167 lb 8 oz (75.978 kg), last menstrual period 10/25/2012.  Physical Exam: General : The patient is a healthy woman in no acute distress.  HEENT: normocephalic, extraoccular movements normal; neck is supple without thyromegally  Lynphnodes: Supraclavicular and inguinal nodes not enlarged  Abdomen: Soft, non-tender, no ascites, no organomegally., no hernias, incision is well-healed Pelvic:  EGBUS: Normal female  Vagina: Normal, no lesions , the cuff is healed. Urethra and Bladder: Normal, non-tender  Cervix: Surgically absent   Uterus: I surgically absent   Rectal: normal sphincter ton, confirmed pelvic mass., no blood  Lower extremities: No edema or varicosities. Normal range of motion      Allergies  Allergen Reactions  . Compazine [Prochlorperazine Maleate] Other (See Comments)    Jittery  . Gluten Meal     Not celiac but pt. Does not tolerate gluten in meals.  . Lactase Swelling  . Soybean Oil Swelling  . Zofran [Ondansetron Hcl] Other (See Comments)    headache  . Sulfa Antibiotics Rash  Past Medical History  Diagnosis Date  . Ectopic pregnancy   . History of chicken pox   . Pelvic mass in female   . Abdominal pain   . Infertility, female   . Hemorrhoids   . Goiter   . Hypothyroidism   . Dermoid cyst   . Ovarian cancer 2014    serous ovarian cancer    Past Surgical History  Procedure Laterality Date  . Laparoscopy for ectopic pregnancy      10 years ago    Current  Outpatient Prescriptions  Medication Sig Dispense Refill  . levothyroxine (SYNTHROID, LEVOTHROID) 50 MCG tablet Take 1 tablet (50 mcg total) by mouth daily. 30 tablet 0   No current facility-administered medications for this visit.    History   Social History  . Marital Status: Married    Spouse Name: N/A  . Number of Children: 2  . Years of Education: N/A   Occupational History  .  Unemployed   Social History Main Topics  . Smoking status: Never Smoker   . Smokeless tobacco: Not on file  . Alcohol Use: No  . Drug Use: No  . Sexual Activity: No   Other Topics Concern  . Not on file   Social History Narrative    Family History  Problem Relation Age of Onset  . Hypothyroidism Mother   . Heart disease Mother   . Ovarian cancer Mother 56    High grade serous ovarian cancer  . Heart disease Father   . Heart attack Father   . Hypothyroidism Sister   . Breast cancer Maternal Aunt 67  . Diabetes Maternal Grandfather   . Diabetes Paternal Grandmother   . Cancer Other     maternal grandmother's sister had an "abdominal" cancer in her 85s  . Cancer Other     maternal grandmother's mother had "abdominal cancer"      CLARKE-PEARSON,Shadee Rathod L, MD 01/27/2015, 11:40 AM

## 2015-01-27 NOTE — Patient Instructions (Signed)
Followup with Dr. Marko Plume in 3 months (October 2016) and Dr.Clarke-Pearson 6 months after that. Please call our office to schedule this appointment in January or February or sooner with any questions or concerns.  We will call you with the CA125 results from today.

## 2015-02-03 ENCOUNTER — Other Ambulatory Visit (HOSPITAL_BASED_OUTPATIENT_CLINIC_OR_DEPARTMENT_OTHER): Payer: 59

## 2015-02-03 ENCOUNTER — Ambulatory Visit (HOSPITAL_BASED_OUTPATIENT_CLINIC_OR_DEPARTMENT_OTHER): Payer: 59

## 2015-02-03 VITALS — BP 94/50 | HR 60 | Temp 98.8°F | Resp 16

## 2015-02-03 DIAGNOSIS — Z452 Encounter for adjustment and management of vascular access device: Secondary | ICD-10-CM

## 2015-02-03 DIAGNOSIS — C561 Malignant neoplasm of right ovary: Secondary | ICD-10-CM

## 2015-02-03 DIAGNOSIS — Z95828 Presence of other vascular implants and grafts: Secondary | ICD-10-CM

## 2015-02-03 LAB — CBC WITH DIFFERENTIAL/PLATELET
BASO%: 0.3 % (ref 0.0–2.0)
Basophils Absolute: 0 10*3/uL (ref 0.0–0.1)
EOS%: 3.1 % (ref 0.0–7.0)
Eosinophils Absolute: 0.1 10*3/uL (ref 0.0–0.5)
HCT: 35.8 % (ref 34.8–46.6)
HGB: 11.7 g/dL (ref 11.6–15.9)
LYMPH#: 1.1 10*3/uL (ref 0.9–3.3)
LYMPH%: 35.3 % (ref 14.0–49.7)
MCH: 30.9 pg (ref 25.1–34.0)
MCHC: 32.7 g/dL (ref 31.5–36.0)
MCV: 94.5 fL (ref 79.5–101.0)
MONO#: 0.3 10*3/uL (ref 0.1–0.9)
MONO%: 8.1 % (ref 0.0–14.0)
NEUT#: 1.7 10*3/uL (ref 1.5–6.5)
NEUT%: 53.2 % (ref 38.4–76.8)
Platelets: 159 10*3/uL (ref 145–400)
RBC: 3.79 10*6/uL (ref 3.70–5.45)
RDW: 11.8 % (ref 11.2–14.5)
WBC: 3.2 10*3/uL — AB (ref 3.9–10.3)

## 2015-02-03 LAB — COMPREHENSIVE METABOLIC PANEL (CC13)
ALK PHOS: 61 U/L (ref 40–150)
ALT: 19 U/L (ref 0–55)
ANION GAP: 9 meq/L (ref 3–11)
AST: 24 U/L (ref 5–34)
Albumin: 4 g/dL (ref 3.5–5.0)
BILIRUBIN TOTAL: 0.58 mg/dL (ref 0.20–1.20)
BUN: 20.3 mg/dL (ref 7.0–26.0)
CALCIUM: 9.5 mg/dL (ref 8.4–10.4)
CO2: 27 mEq/L (ref 22–29)
CREATININE: 0.9 mg/dL (ref 0.6–1.1)
Chloride: 108 mEq/L (ref 98–109)
EGFR: 79 mL/min/{1.73_m2} — ABNORMAL LOW (ref >90)
GLUCOSE: 91 mg/dL (ref 70–140)
Potassium: 4 mEq/L (ref 3.5–5.1)
Sodium: 144 mEq/L (ref 136–145)
Total Protein: 6.7 g/dL (ref 6.4–8.3)

## 2015-02-03 MED ORDER — HEPARIN SOD (PORK) LOCK FLUSH 100 UNIT/ML IV SOLN
500.0000 [IU] | Freq: Once | INTRAVENOUS | Status: AC
  Administered 2015-02-03: 500 [IU] via INTRAVENOUS
  Filled 2015-02-03: qty 5

## 2015-02-03 MED ORDER — SODIUM CHLORIDE 0.9 % IJ SOLN
10.0000 mL | INTRAMUSCULAR | Status: DC | PRN
  Administered 2015-02-03: 10 mL via INTRAVENOUS
  Filled 2015-02-03: qty 10

## 2015-02-03 NOTE — Patient Instructions (Signed)

## 2015-02-06 LAB — CA 125: CA 125: 8 U/mL (ref <35)

## 2015-02-07 ENCOUNTER — Telehealth: Payer: Self-pay

## 2015-02-07 NOTE — Telephone Encounter (Signed)
Told Ms. Lague the results of the CA-125 as noted below by Dr. Marko Plume.

## 2015-02-07 NOTE — Telephone Encounter (Signed)
-----   Message from Gordy Levan, MD sent at 02/07/2015  8:59 AM EDT ----- Labs seen and need follow up: please let her know marker still in good low range at 8

## 2015-04-03 ENCOUNTER — Other Ambulatory Visit: Payer: Self-pay

## 2015-04-03 ENCOUNTER — Ambulatory Visit: Payer: Self-pay | Admitting: Oncology

## 2015-04-03 ENCOUNTER — Telehealth: Payer: Self-pay | Admitting: Neurology

## 2015-04-03 ENCOUNTER — Telehealth: Payer: Self-pay | Admitting: Oncology

## 2015-04-03 NOTE — Telephone Encounter (Signed)
Returned patients call to reschedule her appointments per her voicemail request,left a message with a new appointment

## 2015-04-13 ENCOUNTER — Ambulatory Visit: Payer: Self-pay | Admitting: Oncology

## 2015-04-13 ENCOUNTER — Other Ambulatory Visit: Payer: Self-pay

## 2015-04-18 ENCOUNTER — Telehealth: Payer: Self-pay | Admitting: Oncology

## 2015-04-18 NOTE — Telephone Encounter (Signed)
Patient called to reschedule 10/13 lab/flush/LL. Gave patient next available appointment for lab/flush/LL 10/31 @ 12:15 pm. Also per pt added flush only appointment for 10/5 since her last flush was 7/22.

## 2015-04-19 ENCOUNTER — Ambulatory Visit (HOSPITAL_BASED_OUTPATIENT_CLINIC_OR_DEPARTMENT_OTHER): Payer: 59

## 2015-04-19 DIAGNOSIS — Z452 Encounter for adjustment and management of vascular access device: Secondary | ICD-10-CM

## 2015-04-19 DIAGNOSIS — Z95828 Presence of other vascular implants and grafts: Secondary | ICD-10-CM

## 2015-04-19 DIAGNOSIS — C561 Malignant neoplasm of right ovary: Secondary | ICD-10-CM | POA: Diagnosis not present

## 2015-04-19 MED ORDER — HEPARIN SOD (PORK) LOCK FLUSH 100 UNIT/ML IV SOLN
500.0000 [IU] | Freq: Once | INTRAVENOUS | Status: AC
  Administered 2015-04-19: 500 [IU] via INTRAVENOUS
  Filled 2015-04-19: qty 5

## 2015-04-19 MED ORDER — SODIUM CHLORIDE 0.9 % IJ SOLN
10.0000 mL | INTRAMUSCULAR | Status: DC | PRN
  Administered 2015-04-19: 10 mL via INTRAVENOUS
  Filled 2015-04-19: qty 10

## 2015-04-19 NOTE — Patient Instructions (Signed)

## 2015-04-27 ENCOUNTER — Other Ambulatory Visit: Payer: Self-pay

## 2015-04-27 ENCOUNTER — Ambulatory Visit: Payer: Self-pay | Admitting: Oncology

## 2015-05-11 ENCOUNTER — Other Ambulatory Visit: Payer: Self-pay | Admitting: *Deleted

## 2015-05-11 DIAGNOSIS — C561 Malignant neoplasm of right ovary: Secondary | ICD-10-CM

## 2015-05-14 ENCOUNTER — Other Ambulatory Visit: Payer: Self-pay | Admitting: Oncology

## 2015-05-15 ENCOUNTER — Encounter: Payer: Self-pay | Admitting: Oncology

## 2015-05-15 ENCOUNTER — Ambulatory Visit (HOSPITAL_BASED_OUTPATIENT_CLINIC_OR_DEPARTMENT_OTHER): Payer: 59 | Admitting: Oncology

## 2015-05-15 ENCOUNTER — Ambulatory Visit (HOSPITAL_BASED_OUTPATIENT_CLINIC_OR_DEPARTMENT_OTHER): Payer: 59

## 2015-05-15 ENCOUNTER — Other Ambulatory Visit (HOSPITAL_BASED_OUTPATIENT_CLINIC_OR_DEPARTMENT_OTHER): Payer: 59

## 2015-05-15 VITALS — BP 105/49 | HR 54 | Temp 97.6°F | Resp 18 | Ht 66.0 in | Wt 165.8 lb

## 2015-05-15 DIAGNOSIS — C569 Malignant neoplasm of unspecified ovary: Secondary | ICD-10-CM

## 2015-05-15 DIAGNOSIS — Z95828 Presence of other vascular implants and grafts: Secondary | ICD-10-CM

## 2015-05-15 DIAGNOSIS — C561 Malignant neoplasm of right ovary: Secondary | ICD-10-CM

## 2015-05-15 DIAGNOSIS — Z452 Encounter for adjustment and management of vascular access device: Secondary | ICD-10-CM

## 2015-05-15 DIAGNOSIS — E039 Hypothyroidism, unspecified: Secondary | ICD-10-CM

## 2015-05-15 DIAGNOSIS — Z1239 Encounter for other screening for malignant neoplasm of breast: Secondary | ICD-10-CM

## 2015-05-15 LAB — COMPREHENSIVE METABOLIC PANEL (CC13)
ALBUMIN: 4.1 g/dL (ref 3.5–5.0)
ALT: 15 U/L (ref 0–55)
ANION GAP: 5 meq/L (ref 3–11)
AST: 18 U/L (ref 5–34)
Alkaline Phosphatase: 66 U/L (ref 40–150)
BILIRUBIN TOTAL: 0.56 mg/dL (ref 0.20–1.20)
BUN: 19.3 mg/dL (ref 7.0–26.0)
CALCIUM: 9.8 mg/dL (ref 8.4–10.4)
CO2: 30 mEq/L — ABNORMAL HIGH (ref 22–29)
CREATININE: 0.9 mg/dL (ref 0.6–1.1)
Chloride: 107 mEq/L (ref 98–109)
EGFR: 77 mL/min/{1.73_m2} — ABNORMAL LOW (ref >90)
Glucose: 103 mg/dl (ref 70–140)
Potassium: 4.1 mEq/L (ref 3.5–5.1)
Sodium: 143 mEq/L (ref 136–145)
TOTAL PROTEIN: 7.1 g/dL (ref 6.4–8.3)

## 2015-05-15 LAB — CBC WITH DIFFERENTIAL/PLATELET
BASO%: 0.8 % (ref 0.0–2.0)
BASOS ABS: 0 10*3/uL (ref 0.0–0.1)
EOS ABS: 0.1 10*3/uL (ref 0.0–0.5)
EOS%: 2.4 % (ref 0.0–7.0)
HEMATOCRIT: 37 % (ref 34.8–46.6)
HEMOGLOBIN: 12.3 g/dL (ref 11.6–15.9)
LYMPH#: 1.3 10*3/uL (ref 0.9–3.3)
LYMPH%: 30.6 % (ref 14.0–49.7)
MCH: 31 pg (ref 25.1–34.0)
MCHC: 33.2 g/dL (ref 31.5–36.0)
MCV: 93.6 fL (ref 79.5–101.0)
MONO#: 0.3 10*3/uL (ref 0.1–0.9)
MONO%: 7.8 % (ref 0.0–14.0)
NEUT%: 58.4 % (ref 38.4–76.8)
NEUTROS ABS: 2.5 10*3/uL (ref 1.5–6.5)
PLATELETS: 177 10*3/uL (ref 145–400)
RBC: 3.95 10*6/uL (ref 3.70–5.45)
RDW: 12.2 % (ref 11.2–14.5)
WBC: 4.4 10*3/uL (ref 3.9–10.3)

## 2015-05-15 MED ORDER — HEPARIN SOD (PORK) LOCK FLUSH 100 UNIT/ML IV SOLN
500.0000 [IU] | Freq: Once | INTRAVENOUS | Status: AC
  Administered 2015-05-15: 500 [IU] via INTRAVENOUS
  Filled 2015-05-15: qty 5

## 2015-05-15 MED ORDER — SODIUM CHLORIDE 0.9 % IJ SOLN
10.0000 mL | INTRAMUSCULAR | Status: DC | PRN
  Administered 2015-05-15: 10 mL via INTRAVENOUS
  Filled 2015-05-15: qty 10

## 2015-05-15 NOTE — Progress Notes (Signed)
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 She saw Dr Josephina Shih 01-27-15 and will see him again 6 months from this appointment. Last CT AP was 06-2013.  Patient has felt entirely well, with no residual peripheral neuropathy now, and no other concerns that seem related to the history of ovarian cancer or that treatment. Appetite and energy are good. No respiratory or different GI issues. No bleeding. No LE swelling. LE and back pain has resolved and she has no peripheral neuropathy symptoms now.  PAC is still in, flushed today. Discussed removing this if CA 125 pending today is good. Genetics testing negative 08-2013 CA 125 was elevated at 226 preoperatively (11-2012.) Flu vaccine done 05-02-15  ONCOLOGIC HISTORY Patient presented with RUQ pain in March 2014, with ED evaluation then including abdominal US not remarkable. She was then found to have pelvic mass, with MRI pelvis in [place] system 11-11-12 showing centrally necrotic pelvic mass 11.4 x 9.1 x 7.8 cm. CA 125 by Dr Dellis Filbert 11-13-2012 was 226.4. She saw Dr Josephina Shih in [place] on 11-17-12 and went to surgery by him at [facility] on 11-24-12. Surgery was complete debulking including TAH, BSO, omentectomy, right ureterolysis, bilateral pelvic lymphadenectomy and periaortic lymphadenectomy. There is no mention in the operative note of rupture of the ovarian capsule. 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 Pelvic  washings ([id number]) negative. Patient was hospitalized at [facility] x 5 days; per patient and husband, she was transfused 2 units PRBCs at [facility]. She developed acute herpes zoster left T 11-12 dermatome on 12-12-12, then severe pain left inguinal region and upper lateral left thigh such that start of dose dense adjuvant taxol carboplatin was delayed x2 prior to beginning 01-05-13. She required neupogen x 1-2 days after each treatment, and carbo dose decreased to AUC =5 with cycle 2 and to AUC=3 with cycle 6 due to thrombocytopenia. Cycle 6 was completed 05-11-2013 Note preoperative CA 125 on 11-13-12 was 226 and was 17.6 on 04-20-13.    Review of systems as above, also: All joint, back and LE pain has resolved. Due mammograms at [facility] in Nov which we will schedule. No problems with PAC Remainder of 10 point Review of Systems negative.  Objective:  Vital signs in last 24 hours:  BP 105/49 mmHg  Pulse 54  Temp(Src) 97.6 F (36.4 C) (Oral)  Resp 18  Ht _0  (1.676 m)  Wt 165 lb 12.8 oz (75.206 kg)  BMI 26.77 kg/m2  SpO2 100%  LMP 10/25/2012 Weight down 2 lbs Alert, oriented and appropriate, looks entirely comfortable. Easily ambulatory.    HEENT:PERRL, sclerae not icteric. Oral mucosa moist without lesions, posterior pharynx clear.  Neck supple. No JVD.  Lymphatics:no cervical,supraclavicular, axillary or inguinal adenopathy Resp: clear to auscultation bilaterally and normal percussion bilaterally Cardio: regular rate and rhythm. No gallop. GI: soft, nontender, not distended, no mass or organomegaly. Normally active bowel sounds. Surgical incision not remarkable. Musculoskeletal/ Extremities: without pitting edema, cords, tenderness Neuro:  nonfocal PSYCH appropriate mood and affect Skin without rash, ecchymosis,  petechiae Breasts: bilaterally without dominant mass, skin or nipple findings. Axillae benign. Portacath-without erythema or tenderness  Lab Results:  Results for orders  placed or performed in visit on 05/15/15  CBC with Differential  Result Value Ref Range   WBC 4.4 3.9 - 10.3 10e3/uL   NEUT# 2.5 1.5 - 6.5 10e3/uL   HGB 12.3 11.6 - 15.9 g/dL   HCT 37.0 34.8 - 46.6 %   Platelets 177 145 - 400 10e3/uL   MCV 93.6 79.5 - 101.0 fL   MCH 31.0 25.1 - 34.0 pg   MCHC 33.2 31.5 - 36.0 g/dL   RBC 3.95 3.70 - 5.45 10e6/uL   RDW 12.2 11.2 - 14.5 %   lymph# 1.3 0.9 - 3.3 10e3/uL   MONO# 0.3 0.1 - 0.9 10e3/uL   Eosinophils Absolute 0.1 0.0 - 0.5 10e3/uL   Basophils Absolute 0.0 0.0 - 0.1 10e3/uL   NEUT% 58.4 38.4 - 76.8 %   LYMPH% 30.6 14.0 - 49.7 %   MONO% 7.8 0.0 - 14.0 %   EOS% 2.4 0.0 - 7.0 %   BASO% 0.8 0.0 - 2.0 %  Comprehensive metabolic panel (Cmet) - [facility]  Result Value Ref Range   Sodium 143 136 - 145 mEq/L   Potassium 4.1 3.5 - 5.1 mEq/L   Chloride 107 98 - 109 mEq/L   CO2 30 (H) 22 - 29 mEq/L   Glucose 103 70 - 140 mg/dl   BUN 19.3 7.0 - 26.0 mg/dL   Creatinine 0.9 0.6 - 1.1 mg/dL   Total Bilirubin 0.56 0.20 - 1.20 mg/dL   Alkaline Phosphatase 66 40 - 150 U/L   AST 18 5 - 34 U/L   ALT 15 0 - 55 U/L   Total Protein 7.1 6.4 - 8.3 g/dL   Albumin 4.1 3.5 - 5.0 g/dL   Calcium 9.8 8.4 - 10.4 mg/dL   Anion Gap 5 3 - 11 mEq/L   EGFR 77 (L) >90 ml/min/1.73 m2    CA 125 available after visit 11  Studies/Results:  No results found. Mammograms due at [facility] in Nov, which we will schedule  Medications: I have reviewed the patient's current medications.  DISCUSSION DIscussed change in follow up visits now to every 6 months, as she is now out 2 years from completion of chemotherapy. She knows to call if concerns prior to scheduled visits.  I have told patient that it seems very reasonable to remove PAC as long as CA 125 pending at time of visit is still good, as she is doing very well clinically. Patient is comfortable with this recommendation.I spoke directly with interventional radiologist now, fine to remove PAC that was placed by IR at  [facility]. We will try to set up removal of PAC within next 8 weeks, as just flushed today.  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 She will see Dr Josephina Shih in ~ April 2017 and I will see her again 6 months after that visit, labs with each. 2.herpes zoster just prior to start of chemotherapy, resolved 3. Left LE weakness since surgery and zoster, essentially resolved. 6.hypothyroid on replacement  3.lactose and gluten intolerance:intermittent abdominal/ GI symptoms seem related to this 4.PAC in, placed at [facility]. Needs to be flushed every 6-8 weeks when not used otherwise. 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 Used primarily phenergan for nausea during chemo  7.low back pain resolved with PT, including daily stretching exercises.  8.flu vaccine 05-02-15  All questions answered and patient is in agreement with recommendations and plans. Time spent 20 min including >50% counseling and coordination of care. Cc Escalon, MD   05/15/2015, 1:46 PM

## 2015-05-15 NOTE — Patient Instructions (Signed)

## 2015-05-16 ENCOUNTER — Telehealth: Payer: Self-pay

## 2015-05-16 LAB — CA 125: CA 125: 11 U/mL (ref <35)

## 2015-05-16 NOTE — Telephone Encounter (Signed)
LM stating that CA-125 level from 05-15-15 was good at 11.  Dr. Marko Plume is fine for her to have PAC out by [facility] IR as scheduled on 05-31-15. She can call [phone] if she has any questions or concerns.

## 2015-05-16 NOTE — Telephone Encounter (Signed)
-----   Message from Gordy Levan, MD sent at 05/15/2015  1:34 PM EDT ----- Need to let her know results of CA 125 from 05-15-15 If ok will have PAC removed by IR  thanks

## 2015-05-21 ENCOUNTER — Other Ambulatory Visit: Payer: Self-pay | Admitting: Oncology

## 2015-05-23 ENCOUNTER — Other Ambulatory Visit (HOSPITAL_COMMUNITY): Payer: Self-pay

## 2015-05-23 ENCOUNTER — Telehealth: Payer: Self-pay | Admitting: Oncology

## 2015-05-23 NOTE — Telephone Encounter (Signed)
Message sent to Georges Mouse and Melissa C in gynonc re f/u with Dr. Aldean Ast late April per 05/15/15 pof. Added lab for April 2017 and mammo at [facility] for November 2016. Left message for patient re Candice Zamora - additional lab April 2017 and being contacted by gynonc re f/u w/Dr. Aldean Ast. Patient was given October 2017 appointments and also port removal prior to leaving 05/15/15. New schedule mailed today.

## 2015-05-25 ENCOUNTER — Telehealth: Payer: Self-pay | Admitting: *Deleted

## 2015-05-25 NOTE — Telephone Encounter (Signed)
Left message on voicemail with future appointment details. Pt is scheduled to Dr. Fermin Schwab on 11/10/2014 @ 12:00. Pt was advised to arrive 30 min early to register.

## 2015-05-30 ENCOUNTER — Other Ambulatory Visit: Payer: Self-pay | Admitting: Radiology

## 2015-05-31 ENCOUNTER — Ambulatory Visit (HOSPITAL_COMMUNITY)
Admission: RE | Admit: 2015-05-31 | Discharge: 2015-05-31 | Disposition: A | Discharge status: Home / Self Care | Payer: 59 | Source: Ambulatory Visit | Attending: Oncology | Admitting: Oncology

## 2015-05-31 ENCOUNTER — Encounter (HOSPITAL_COMMUNITY): Payer: Self-pay

## 2015-05-31 DIAGNOSIS — C569 Malignant neoplasm of unspecified ovary: Secondary | ICD-10-CM | POA: Insufficient documentation

## 2015-05-31 DIAGNOSIS — Z452 Encounter for adjustment and management of vascular access device: Secondary | ICD-10-CM | POA: Insufficient documentation

## 2015-05-31 LAB — CBC
HEMATOCRIT: 36.3 % (ref 36.0–46.0)
Hemoglobin: 12.1 g/dL (ref 12.0–15.0)
MCH: 31.2 pg (ref 26.0–34.0)
MCHC: 33.3 g/dL (ref 30.0–36.0)
MCV: 93.6 fL (ref 78.0–100.0)
PLATELETS: 176 10*3/uL (ref 150–400)
RBC: 3.88 MIL/uL (ref 3.87–5.11)
RDW: 11.9 % (ref 11.5–15.5)
WBC: 3.9 10*3/uL — AB (ref 4.0–10.5)

## 2015-05-31 LAB — BASIC METABOLIC PANEL
Anion gap: 8 (ref 5–15)
BUN: 17 mg/dL (ref 6–20)
CALCIUM: 9.7 mg/dL (ref 8.9–10.3)
CO2: 27 mmol/L (ref 22–32)
Chloride: 108 mmol/L (ref 101–111)
Creatinine, Ser: 0.93 mg/dL (ref 0.44–1.00)
GFR calc Af Amer: >60 mL/min (ref >60)
Glucose, Bld: 84 mg/dL (ref 65–99)
POTASSIUM: 4 mmol/L (ref 3.5–5.1)
SODIUM: 143 mmol/L (ref 135–145)

## 2015-05-31 LAB — PROTIME-INR
INR: 0.98 (ref 0.00–1.49)
PROTHROMBIN TIME: 13.2 s (ref 11.6–15.2)

## 2015-05-31 LAB — APTT: APTT: 29 s (ref 24–37)

## 2015-05-31 MED ORDER — SODIUM BICARBONATE 4 % IV SOLN
INTRAVENOUS | Status: DC
Start: 2015-05-31 — End: 2015-06-01
  Filled 2015-05-31: qty 5

## 2015-05-31 MED ORDER — CEFAZOLIN SODIUM-DEXTROSE 2-3 GM-% IV SOLR
INTRAVENOUS | Status: AC
  Administered 2015-05-31: 2 g via INTRAVENOUS
  Filled 2015-05-31: qty 50

## 2015-05-31 MED ORDER — LIDOCAINE-EPINEPHRINE 2 %-1:100000 IJ SOLN
INTRAMUSCULAR | Status: DC
Start: 2015-05-31 — End: 2015-06-01
  Filled 2015-05-31: qty 1

## 2015-05-31 MED ORDER — SODIUM CHLORIDE 0.9 % IV SOLN
Freq: Once | INTRAVENOUS | Status: AC
  Administered 2015-05-31: 10 mL/h via INTRAVENOUS

## 2015-05-31 MED ORDER — CEFAZOLIN SODIUM-DEXTROSE 2-3 GM-% IV SOLR
2.0000 g | INTRAVENOUS | Status: AC
  Administered 2015-05-31: 2 g via INTRAVENOUS

## 2015-05-31 NOTE — Discharge Instructions (Signed)
Incision Care An incision is when a surgeon cuts into your body. After surgery, the incision needs to be cared for properly to prevent infection.  HOW TO CARE FOR YOUR INCISION  Take medicines only as directed by your health care provider.  There are many different ways to close and cover an incision, including stitches, skin glue, and adhesive strips. Follow your health care provider's instructions on:  Incision care - keep clean and dry. Do not use lotions or creams over the incision until it is fully healed unless directed by your doctor.  Bandage (dressing) changes and removal - you may remove dressing and shower after 24hrs. No need to cover surgical glue after removing dressing.  Incision closure removal - the surgical glue will wear away on its own.  Do not take baths, swim, or use a hot tub until your health care provider approves. You may shower as directed by your health care provider, after 24 hours.  Resume your normal diet and activities as directed.  Use anti-itch medicine (such as an antihistamine) as directed by your health care provider. The incision may itch while it is healing. Do not pick or scratch at the incision.  Drink enough fluid to keep your urine clear or pale yellow. SEEK MEDICAL CARE IF:   You have drainage, redness, swelling, or pain at your incision site.  You have muscle aches, chills, or a general ill feeling.  You notice a bad smell coming from the incision or dressing.  Your incision edges separate after the sutures, staples, or skin adhesive strips have been removed.  You have persistent nausea or vomiting.  You have a fever.  You are dizzy. SEEK IMMEDIATE MEDICAL CARE IF:   You have a rash.  You faint.  You have difficulty breathing. MAKE SURE YOU:   Understand these instructions.  Will watch your condition.  Will get help right away if you are not doing well or get worse.   This information is not intended to replace advice  given to you by your health care provider. Make sure you discuss any questions you have with your health care provider.   Document Released: 01/18/2005 Document Revised: 07/22/2014 Document Reviewed: 08/25/2013 Elsevier Interactive Patient Education [employer].

## 2015-05-31 NOTE — Procedures (Signed)
Interventional Radiology Procedure Note  Procedure: Removal of right IJ approach single lumen PowerPort.    Complications: None Recommendations:  - Ok to shower tomorrow - Do not submerge for 7 days - Routine care   Signed,  Dulcy Fanny. Earleen Newport, DO

## 2015-06-14 ENCOUNTER — Ambulatory Visit
Admission: RE | Admit: 2015-06-14 | Discharge: 2015-06-14 | Disposition: A | Discharge status: Home / Self Care | Payer: 59 | Source: Ambulatory Visit | Attending: Oncology | Admitting: Oncology

## 2015-06-14 DIAGNOSIS — Z1239 Encounter for other screening for malignant neoplasm of breast: Secondary | ICD-10-CM

## 2015-11-02 ENCOUNTER — Other Ambulatory Visit (HOSPITAL_BASED_OUTPATIENT_CLINIC_OR_DEPARTMENT_OTHER): Payer: 59

## 2015-11-02 DIAGNOSIS — C561 Malignant neoplasm of right ovary: Secondary | ICD-10-CM | POA: Diagnosis not present

## 2015-11-03 ENCOUNTER — Telehealth: Payer: Self-pay

## 2015-11-03 LAB — CANCER ANTIGEN 125 (PARALLEL TESTING): CA 125: 8 U/mL (ref <35)

## 2015-11-03 LAB — CA 125: CANCER ANTIGEN (CA) 125: 12.6 U/mL (ref 0.0–38.1)

## 2015-11-03 NOTE — Telephone Encounter (Signed)
Told Candice Zamora that the CA-125 by the old method is 8 and was 11 previously and 12.6.  These values are well within the normal range. Faxed the results to Dr. Laurann Montana just now as Candice Zamora is waiting to to be seen by Dr. Laurann Montana to follow up with symptoms noted below.

## 2015-11-03 NOTE — Telephone Encounter (Signed)
Candice Zamora mwas here for labs only 11-02-15 pm.   She spoke with this nurse stating that she has been experiencing some symptoms that began ~3 weeks ago. She occasionally has been experiencing some burning, urgency, and pressure with urination. Her stools are long,soft, and thin.  She had some bright red blood on the tissue last week after a BM.  She does have some hemorrhoids. She has had a 12 lb weight loss over the last 6 months. She is also under a lot of stress at work in the last 6 months.   She has appointment today @ 4 pm with Dr. Kelton Pillar (PCP).  Encouraged her to discuss these symptoms with her. Pt. wanted Dr. Marko Plume to know these symptoms incase her CA-125 was elevated. Reviewed with Dr. Marko Plume and she agreed with patient  Discussing these symptoms with Dr. Laurann Montana today. Will let her know the results of the CA-125 after Dr. Marko Plume has reviewed results.

## 2015-11-10 ENCOUNTER — Encounter: Payer: Self-pay | Admitting: Gynecology

## 2015-11-10 ENCOUNTER — Ambulatory Visit: Payer: 59 | Attending: Gynecology | Admitting: Gynecology

## 2015-11-10 VITALS — BP 96/56 | HR 65 | Temp 98.0°F | Resp 17 | Ht 66.0 in | Wt 160.2 lb

## 2015-11-10 DIAGNOSIS — Z08 Encounter for follow-up examination after completed treatment for malignant neoplasm: Secondary | ICD-10-CM | POA: Diagnosis not present

## 2015-11-10 DIAGNOSIS — Z8543 Personal history of malignant neoplasm of ovary: Secondary | ICD-10-CM

## 2015-11-10 DIAGNOSIS — Z79899 Other long term (current) drug therapy: Secondary | ICD-10-CM | POA: Diagnosis not present

## 2015-11-10 DIAGNOSIS — Z803 Family history of malignant neoplasm of breast: Secondary | ICD-10-CM | POA: Insufficient documentation

## 2015-11-10 DIAGNOSIS — E039 Hypothyroidism, unspecified: Secondary | ICD-10-CM | POA: Insufficient documentation

## 2015-11-10 DIAGNOSIS — Z8041 Family history of malignant neoplasm of ovary: Secondary | ICD-10-CM | POA: Diagnosis not present

## 2015-11-10 DIAGNOSIS — C569 Malignant neoplasm of unspecified ovary: Secondary | ICD-10-CM | POA: Diagnosis present

## 2015-11-10 DIAGNOSIS — Z9221 Personal history of antineoplastic chemotherapy: Secondary | ICD-10-CM | POA: Diagnosis not present

## 2015-11-10 NOTE — Progress Notes (Signed)
Consult Note: Gyn-Onc   Candice Zamora 50 y.o. female  Chief Complaint  Patient presents with  . 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 Plan:  Patient returns see Dr. Marko Plume in 6 months. And return to see [place] in one year. We will continue to monitor CA-125 values. The patient is scheduled to have colonoscopy in the very near future. She is up-to-date with mammograms.  Interval history The patient returns today as previously scheduled. She completed 6 cycles of adjuvant dose dense carboplatin and Taxol chemotherapy. Followup CT scan on 06/15/2013 shows no evidence of disease.     Appetite is good she has no GI or GU symptoms. She denies any pelvic symptoms.  Her functional status is excellent and she is working full time. Recent CA-125 value was 8 units per mL.    HPI:  70 her old white married female gravida 4 para 2 seen in consultation request of Dr.[name] regarding management of a newly diagnosed complex pelvic mass. The patient has had several months of lower abdominal pain and initially underwent a pelvic ultrasound on November 09, 2012 that showed a 10.8 x 8.7 x 9.2 cm heterogeneous pelvic mass. Small fibroids were also noted. The adnexa were poorly seen. Subsequently she underwent a MRI of the pelvis which demonstrated a centrally necrotic pelvic mass which may arise from the posterior uterus or the right adnexa. She had a normal-appearing left ovary moderate ascites and no evidence of adenopathy. CA 125 was 226 units per mL CEA 1.5 (normal) OVA1 was 9.4 (upper limits of normal for premenopausal patient is 5) Patient underwent exploratory laparotomy, TAH, BSO, omentectomy, pelvic and aortic lymphadenectomy at [facility] on 11/24/2012. She had 11 x 9 x 7 cm right adnexal mass which was densely adherent to the right pelvic sidewall (stage IIB). 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 All other biopsies including  pelvic and para-aortic lymphadenectomy were negative.  Postoperatively she was treated with dose dense carboplatin and Taxol. For 6 cycles. CT scan the end of therapy on 06/15/2013 was normal and CA 125 is 17 units per mL.   Review of Systems:10 point review of systems is negative except as noted in interval history.   Vitals: Blood pressure 96/56, pulse 65, temperature 98 F (36.7 C), temperature source Oral, resp. rate 17, height 5\' 6"  (1.676 m), weight 160 lb 3.2 oz (72.666 kg), last menstrual period 10/25/2012, SpO2 98 %.  Physical Exam: General : The patient is a healthy woman in no acute distress.  HEENT: normocephalic, extraoccular movements normal; neck is supple without thyromegally  Lynphnodes: Supraclavicular and inguinal nodes not enlarged  Abdomen: Soft, non-tender, no ascites, no organomegally., no hernias, incision is well-healed Pelvic:  EGBUS: Normal female  Vagina: Normal, no lesions , the cuff is healed. Urethra and Bladder: Normal, non-tender  Cervix: Surgically absent   Uterus: I surgically absent   Rectal: normal sphincter ton, confirmed pelvic mass., no blood  Lower extremities: No edema or varicosities. Normal range of motion      Allergies  Allergen Reactions  . Compazine [Prochlorperazine Maleate] Other (See Comments)    Jittery  . Gluten Meal     Not celiac but pt. Does not tolerate gluten in meals.  . Lactase Swelling  . Soybean Oil Swelling  . Zofran [Ondansetron Hcl] Other (See Comments)    headache  . Sulfa Antibiotics Rash    Past Medical History  Diagnosis Date  . Ectopic pregnancy   . History of chicken pox   . Pelvic mass in female   . Abdominal pain   . Infertility, female   . Hemorrhoids   . Goiter   . Hypothyroidism   . Dermoid cyst   . Ovarian cancer ([place]) 2014    serous ovarian cancer    Past Surgical History  Procedure Laterality Date  . Laparoscopy for ectopic pregnancy      10 years ago    Current Outpatient  Prescriptions  Medication Sig Dispense Refill  . levothyroxine (SYNTHROID, LEVOTHROID) 50 MCG tablet Take 1 tablet (50 mcg total) by mouth daily. 30 tablet 0  . nitrofurantoin, macrocrystal-monohydrate, (MACROBID) 100 MG capsule TK 1 C PO Q 12 H WF FOR 7 DAYS  0   No current facility-administered medications for this visit.    Social History   Social History  . Marital Status: Married    Spouse Name: N/A  . Number of Children: 2  . Years of Education: N/A   Occupational History  .  Unemployed   Social History Main Topics  . Smoking status: Never Smoker   . Smokeless tobacco: Not on file  . Alcohol Use: No  . Drug Use: No  . Sexual Activity: No   Other Topics Concern  . Not on file   Social History Narrative    Family History  Problem Relation Age of Onset  . Hypothyroidism Mother   . Heart disease Mother   . Ovarian cancer Mother 3    High grade serous ovarian cancer  . Heart disease Father   . Heart attack Father   . Hypothyroidism Sister   . Breast cancer Maternal Aunt 67  . Diabetes Maternal Grandfather   . Diabetes Paternal Grandmother   . Cancer Other     maternal grandmother's sister had an "abdominal" cancer in her 77s  . Cancer Other     maternal grandmother's mother had "abdominal cancer"      Alvino Chapel, MD 11/10/2015, 12:54 PM

## 2015-11-10 NOTE — Patient Instructions (Signed)
Plan to follow up in one year or sooner if needed.  Please call in the Fall of 2017 to schedule your appointment.

## 2016-04-18 ENCOUNTER — Other Ambulatory Visit: Payer: Self-pay | Admitting: Internal Medicine

## 2016-04-18 ENCOUNTER — Emergency Department (HOSPITAL_COMMUNITY): Payer: 59

## 2016-04-18 ENCOUNTER — Emergency Department (HOSPITAL_COMMUNITY)
Admission: EM | Admit: 2016-04-18 | Discharge: 2016-04-18 | Disposition: A | Discharge status: Home / Self Care | Payer: 59 | Attending: Emergency Medicine | Admitting: Emergency Medicine

## 2016-04-18 ENCOUNTER — Encounter (HOSPITAL_COMMUNITY): Payer: Self-pay | Admitting: Emergency Medicine

## 2016-04-18 DIAGNOSIS — E039 Hypothyroidism, unspecified: Secondary | ICD-10-CM | POA: Insufficient documentation

## 2016-04-18 DIAGNOSIS — Z79899 Other long term (current) drug therapy: Secondary | ICD-10-CM | POA: Insufficient documentation

## 2016-04-18 DIAGNOSIS — N200 Calculus of kidney: Secondary | ICD-10-CM | POA: Insufficient documentation

## 2016-04-18 DIAGNOSIS — R100 Acute abdomen: Secondary | ICD-10-CM | POA: Diagnosis present

## 2016-04-18 LAB — COMPREHENSIVE METABOLIC PANEL
ALT: 23 U/L (ref 14–54)
AST: 29 U/L (ref 15–41)
Albumin: 4.8 g/dL (ref 3.5–5.0)
Alkaline Phosphatase: 62 U/L (ref 38–126)
Anion gap: 9 (ref 5–15)
BUN: 22 mg/dL — ABNORMAL HIGH (ref 6–20)
CALCIUM: 9.7 mg/dL (ref 8.9–10.3)
CHLORIDE: 107 mmol/L (ref 101–111)
CO2: 27 mmol/L (ref 22–32)
CREATININE: 1.11 mg/dL — AB (ref 0.44–1.00)
GFR, EST NON AFRICAN AMERICAN: 57 mL/min — AB (ref >60)
Glucose, Bld: 144 mg/dL — ABNORMAL HIGH (ref 65–99)
Potassium: 4 mmol/L (ref 3.5–5.1)
Sodium: 143 mmol/L (ref 135–145)
TOTAL PROTEIN: 7.8 g/dL (ref 6.5–8.1)
Total Bilirubin: 0.9 mg/dL (ref 0.3–1.2)

## 2016-04-18 LAB — URINALYSIS, ROUTINE W REFLEX MICROSCOPIC
BILIRUBIN URINE: NEGATIVE
Glucose, UA: NEGATIVE mg/dL
KETONES UR: NEGATIVE mg/dL
LEUKOCYTES UA: NEGATIVE
NITRITE: NEGATIVE
PH: 7.5 (ref 5.0–8.0)
Protein, ur: NEGATIVE mg/dL
Specific Gravity, Urine: 1.018 (ref 1.005–1.030)

## 2016-04-18 LAB — CBC
HCT: 39.8 % (ref 36.0–46.0)
Hemoglobin: 13 g/dL (ref 12.0–15.0)
MCH: 31.5 pg (ref 26.0–34.0)
MCHC: 32.7 g/dL (ref 30.0–36.0)
MCV: 96.4 fL (ref 78.0–100.0)
PLATELETS: 200 10*3/uL (ref 150–400)
RBC: 4.13 MIL/uL (ref 3.87–5.11)
RDW: 12.2 % (ref 11.5–15.5)
WBC: 4.5 10*3/uL (ref 4.0–10.5)

## 2016-04-18 LAB — URINE MICROSCOPIC-ADD ON

## 2016-04-18 LAB — LIPASE, BLOOD: LIPASE: 21 U/L (ref 11–51)

## 2016-04-18 MED ORDER — PROMETHAZINE HCL 25 MG/ML IJ SOLN
12.5000 mg | Freq: Once | INTRAMUSCULAR | Status: AC
  Administered 2016-04-18: 12.5 mg via INTRAVENOUS
  Filled 2016-04-18: qty 1

## 2016-04-18 MED ORDER — SODIUM CHLORIDE 0.9 % IV BOLUS (SEPSIS)
1000.0000 mL | Freq: Once | INTRAVENOUS | Status: AC
  Administered 2016-04-18: 1000 mL via INTRAVENOUS

## 2016-04-18 MED ORDER — KETOROLAC TROMETHAMINE 30 MG/ML IJ SOLN
30.0000 mg | Freq: Once | INTRAMUSCULAR | Status: AC
  Administered 2016-04-18: 30 mg via INTRAVENOUS
  Filled 2016-04-18: qty 1

## 2016-04-18 MED ORDER — TAMSULOSIN HCL 0.4 MG PO CAPS: 0.4000 mg | ORAL_CAPSULE | Freq: Every day | ORAL | 0 refills | Status: DC

## 2016-04-18 MED ORDER — MORPHINE SULFATE (PF) 4 MG/ML IV SOLN
4.0000 mg | Freq: Once | INTRAVENOUS | Status: AC
  Administered 2016-04-18: 4 mg via INTRAVENOUS
  Filled 2016-04-18: qty 1

## 2016-04-18 NOTE — ED Notes (Signed)
MD notified of patient's BP.

## 2016-04-18 NOTE — ED Notes (Signed)
Patient aware that we need urine sample.

## 2016-04-18 NOTE — ED Notes (Signed)
Patient returned from CT

## 2016-04-18 NOTE — ED Notes (Signed)
Bed: [facility]1 Expected date:  Expected time:  Means of arrival:  Comments: 

## 2016-04-18 NOTE — Discharge Instructions (Signed)
Please take tamsulosin (Flomax) once daily for the next week and follow up with your PCP. You can ask your PCP for referral to urology in the future if you want to investigate your kidney stones further. Remember to stay well hydrated in the future as dehydration puts you at increased risk for kidney stone formation and growth. We are glad to see you feeling much better!

## 2016-04-18 NOTE — ED Provider Notes (Signed)
[facility] DEPT Provider Note   CSN: [id number] Arrival date & time: 04/18/16  [id number]     History   Chief Complaint Chief Complaint  Patient presents with  . Abdominal Pain    HPI Candice Zamora is a 50 y.o. female.  [name] is a 50 yo woman with PMH hypothyroidism and ovarian cancer (stage IIB, s/p TAH/BSO/omentx and 6 cycles chemo in 2014) who presents for acute abdominal pain with nausea/vomiting that started at 630 this morning. She reports mild back ache for several days prior to presentation. The pain is sharp and colicky, located in left lower quadrant and left flank. She reports feeling like the pain has moved lower since earlier today. She endorses subjective fevers/chills since this AM, belching, and denies diarrhea, constipation, chest pain, shortness of breath, or urinary symptoms.   The history is provided by the patient. No language interpreter was used.    Past Medical History:  Diagnosis Date  . Abdominal pain   . Dermoid cyst   . Ectopic pregnancy   . Goiter   . Hemorrhoids   . History of chicken pox   . Hypothyroidism   . Infertility, female   . Ovarian cancer ([place]) 2014   serous ovarian cancer  . Pelvic mass in female     Patient Active Problem List   Diagnosis Date Noted  . 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 Port catheter in place 10/29/2014  . Ovarian cancer on right ([place]) 10/29/2014  . Screening mammogram for high-risk patient 10/29/2014  . Ovarian cancer ([place])   . Malignant neoplasm of ovary ([place]) 12/13/2012  . Elevated CA-125 11/17/2012  . Ectopic pregnancy     Past Surgical History:  Procedure Laterality Date  . LAPAROSCOPY FOR ECTOPIC PREGNANCY     10 years ago    OB History    No data available       Home Medications    Prior to Admission medications   Medication Sig Start Date End Date Taking? Authorizing Provider  acetaminophen (TYLENOL) 500 MG tablet Take 500 mg by mouth every 6 (six) hours  as needed for moderate pain or headache.   Yes Historical Provider, MD  GINKGO BILOBA PO Take 1 tablet by mouth daily as needed (memory).   Yes Historical Provider, MD  levothyroxine (SYNTHROID, LEVOTHROID) 50 MCG tablet Take 1 tablet (50 mcg total) by mouth daily. 08/02/13  Yes Lennis Marion Downer, MD  Multiple Vitamin (MULTIVITAMIN WITH MINERALS) TABS tablet Take 1 tablet by mouth once a week.   Yes Historical Provider, MD  naproxen sodium (ANAPROX) 220 MG tablet Take 220 mg by mouth 2 (two) times daily as needed (pain).   Yes Historical Provider, MD  Probiotic CAPS Take 1 capsule by mouth daily as needed (regularity).   Yes Historical Provider, MD  tamsulosin (FLOMAX) 0.4 MG CAPS capsule Take 1 capsule (0.4 mg total) by mouth daily. 04/18/16   Asencion Partridge, MD    Family History Family History  Problem Relation Age of Onset  . Hypothyroidism Mother   . Heart disease Mother   . Ovarian cancer Mother 42    High grade serous ovarian cancer  . Heart disease Father   . Heart attack Father   . Hypothyroidism Sister   . Breast cancer Maternal Aunt 67  . Diabetes Maternal Grandfather   . Diabetes Paternal Grandmother   . Cancer Other     maternal grandmother's sister had an "abdominal" cancer in her 40s  . Cancer  Other     maternal grandmother's mother had "abdominal cancer"    Social History Social History  Substance Use Topics  . Smoking status: Never Smoker  . Smokeless tobacco: Not on file  . Alcohol use No     Allergies   Compazine [prochlorperazine maleate]; Gluten meal; Lactase; Soybean oil; Zofran [ondansetron hcl]; and Sulfa antibiotics   Review of Systems Review of Systems  Constitutional: Positive for chills and fever. Negative for appetite change.  Respiratory: Negative for shortness of breath.   Cardiovascular: Negative for chest pain.  Gastrointestinal: Positive for abdominal pain, nausea and vomiting. Negative for abdominal distention, constipation and diarrhea.    Genitourinary: Positive for difficulty urinating and flank pain. Negative for dysuria, frequency, pelvic pain and urgency.  Neurological: Negative for dizziness and headaches.  All other systems reviewed and are negative.    Physical Exam Updated Vital Signs BP (!) 96/54   Pulse (!) 48   Temp 97.7 F (36.5 C) (Oral)   Resp 18   LMP 10/25/2012   SpO2 100%   Physical Exam  Constitutional: She is oriented to person, place, and time. She appears well-developed. She appears distressed.  Moaning and curled in fetal position  HENT:  Head: Normocephalic and atraumatic.  Eyes: Conjunctivae and EOM are normal. Pupils are equal, round, and reactive to light.  Neck: Normal range of motion. Neck supple.  Cardiovascular: Normal rate, regular rhythm, normal heart sounds and intact distal pulses.   Pulmonary/Chest: Effort normal and breath sounds normal. No respiratory distress. She has no wheezes. She exhibits no tenderness.  Abdominal: Soft. Bowel sounds are normal. She exhibits no distension and no mass. There is tenderness (mild TTP in LLQ). There is no rebound and no guarding. No hernia.  Some left flank/CVA tenderness to percussion  Musculoskeletal: Normal range of motion. She exhibits no edema, tenderness or deformity.  Neurological: She is alert and oriented to person, place, and time.  Skin: Skin is warm and dry. Capillary refill takes less than 2 seconds. She is not diaphoretic.  Psychiatric: She has a normal mood and affect. Thought content normal.     ED Treatments / Results  Labs (all labs ordered are listed, but only abnormal results are displayed) Labs Reviewed  COMPREHENSIVE METABOLIC PANEL - Abnormal; Notable for the following:       Result Value   Glucose, Bld 144 (*)    BUN 22 (*)    Creatinine, Ser 1.11 (*)    GFR calc non Af Amer 57 (*)    All other components within normal limits  URINALYSIS, ROUTINE W REFLEX MICROSCOPIC (NOT AT [facility]) - Abnormal; Notable for the  following:    Hgb urine dipstick LARGE (*)    All other components within normal limits  URINE MICROSCOPIC-ADD ON - Abnormal; Notable for the following:    Squamous Epithelial / LPF 0-5 (*)    Bacteria, UA RARE (*)    All other components within normal limits  LIPASE, BLOOD  CBC    EKG  EKG Interpretation None       Radiology Ct Renal Stone Study  Result Date: 04/18/2016 CLINICAL DATA:  50 year old female with history of left-sided abdominal and flank pain with emesis since 06/30 this morning. EXAM: CT ABDOMEN AND PELVIS WITHOUT CONTRAST TECHNIQUE: Multidetector CT imaging of the abdomen and pelvis was performed following the standard protocol without IV contrast. COMPARISON:  CT the abdomen and pelvis 06/15/2013. FINDINGS: Lower chest: Mild scarring in the right middle lobe. Hepatobiliary:  Well-defined 2.9 cm low-attenuation lesion in segment 8 of the liver is incompletely characterized on today's examination, but previously characterized as a simple cyst. Sub cm low-attenuation lesion in segment 2 of the liver is stable compared to prior study from 06/15/2013, presumably a benign lesion such as a tiny simple cyst. No new suspicious hepatic lesions. Gallbladder is normal in appearance. Pancreas: No definite pancreatic mass or peripancreatic inflammatory changes on today's noncontrast CT examination. Spleen: Unremarkable. Adrenals/Urinary Tract: 3 mm calculus at the left ureterovesicular junction (image 75 of series 2) with mild proximal left hydroureteronephrosis. Multiple additional nonobstructive calculi are noted within the collecting systems of the kidneys bilaterally, measuring up to 5 mm in the lower pole collecting system of the right kidney. Unenhanced appearance of the kidneys is otherwise unremarkable. Unenhanced appearance of the urinary bladder is normal. The appearance of the adrenal glands is normal bilaterally. Stomach/Bowel: Unenhanced appearance of the stomach is normal. There  is no pathologic dilatation of small bowel or colon. Normal appendix. Vascular/Lymphatic: No atherosclerotic calcifications or definite aneurysm identified in the abdominal or pelvic vasculature. No lymphadenopathy noted in the abdomen or pelvis. Surgical clips in the retroperitoneum could indicate prior lymph node dissection. Reproductive: Status post hysterectomy. Ovaries are not confidently identified may be surgically absent or atrophic. Other: No significant volume of ascites.  No pneumoperitoneum. Musculoskeletal: There are no aggressive appearing lytic or blastic lesions noted in the visualized portions of the skeleton. IMPRESSION: 1. 3 mm partially obstructive calculus at the left ureterovesicular junction with mild proximal left-sided hydroureteronephrosis. 2. Multiple nonobstructive calculi are also noted in the collecting systems of the kidneys bilaterally measuring up to 5 mm in the lower pole collecting system of the right kidney. 3. Additional incidental findings, similar to prior studies, as above. Electronically Signed   By: Vinnie Langton M.D.   On: 04/18/2016 10:35    Procedures Procedures (including critical care time)  Medications Ordered in ED Medications  morphine 4 MG/ML injection 4 mg (4 mg Intravenous Given 04/18/16 0933)  promethazine (PHENERGAN) injection 12.5 mg (12.5 mg Intravenous Given 04/18/16 0932)  sodium chloride 0.9 % bolus 1,000 mL (0 mLs Intravenous Stopped 04/18/16 1202)  ketorolac (TORADOL) 30 MG/ML injection 30 mg (30 mg Intravenous Given 04/18/16 0933)  sodium chloride 0.9 % bolus 1,000 mL (0 mLs Intravenous Stopped 04/18/16 1358)     Initial Impression / Assessment and Plan / ED Course  I have reviewed the triage vital signs and the nursing notes.  Pertinent labs & imaging results that were available during my care of the patient were reviewed by me and considered in my medical decision making (see chart for details).  Clinical Course   Ms. Kuzel Is  50 year old female past medical history locally advanced ovarian cancer in remission who presents with acute onset left-sided abdominal and flank pain that developed acutely at 6:30 this morning. Pain is sharp and colicky in nature, and has shifted lower since onset. Patient with mild left-sided CVA tenderness on exam and benign abdominal exam. Patient hemodynamically stable and labs largely unremarkable, slight creatinine bump to 1.11 from baseline 0.9. Symptoms and clinical history most consistent with acute ureteral obstruction, likely nephrolithiasis, less likely ovarian cancer recurrence. Will obtain CT renal stone study, and provide IV morphine, IV Phenergan, IV Toradol, and IV fluids for symptom relief.  CT revealed 28mm stone in left ureter with mild hydro of left kidney. Patient feeling much better after IV medications, colicky pain persisting and feels lower than previous.  Patient has spontaneous resolution of all pain in afternoon, stone likely passed. Provided 1 week Rx for Tamsulosin, encouraged remaining well-hydrated in future. Patient plans to follow up with PCP and will seek urology as needed in the future. Stable for discharge home and at baseline.  Final Clinical Impressions(s) / ED Diagnoses   Final diagnoses:  Nephrolithiasis    New Prescriptions Discharge Medication List as of 04/18/2016  1:45 PM    START taking these medications   Details  tamsulosin (FLOMAX) 0.4 MG CAPS capsule Take 1 capsule (0.4 mg total) by mouth daily., Starting Thu 04/18/2016, Print         Asencion Partridge, MD 04/18/16 [place] Yao, MD 04/19/16 [phone]

## 2016-04-18 NOTE — ED Triage Notes (Signed)
Pt reports L side abd and flank pain accompanied by emesis which began at 0630.

## 2016-04-18 NOTE — ED Notes (Signed)
Patient aware we need urine, unable to give specimen at this time.

## 2016-05-12 ENCOUNTER — Other Ambulatory Visit: Payer: Self-pay | Admitting: Oncology

## 2016-05-13 ENCOUNTER — Other Ambulatory Visit (HOSPITAL_BASED_OUTPATIENT_CLINIC_OR_DEPARTMENT_OTHER): Payer: 59

## 2016-05-13 ENCOUNTER — Ambulatory Visit (HOSPITAL_BASED_OUTPATIENT_CLINIC_OR_DEPARTMENT_OTHER): Payer: 59 | Admitting: Oncology

## 2016-05-13 VITALS — BP 109/57 | HR 64 | Temp 97.7°F | Resp 18 | Ht 66.0 in | Wt 167.4 lb

## 2016-05-13 DIAGNOSIS — E039 Hypothyroidism, unspecified: Secondary | ICD-10-CM

## 2016-05-13 DIAGNOSIS — T451X5A Adverse effect of antineoplastic and immunosuppressive drugs, initial encounter: Secondary | ICD-10-CM

## 2016-05-13 DIAGNOSIS — G620 Drug-induced polyneuropathy: Secondary | ICD-10-CM | POA: Diagnosis not present

## 2016-05-13 DIAGNOSIS — R413 Other amnesia: Secondary | ICD-10-CM | POA: Diagnosis not present

## 2016-05-13 DIAGNOSIS — C561 Malignant neoplasm of right ovary: Secondary | ICD-10-CM

## 2016-05-13 DIAGNOSIS — N200 Calculus of kidney: Secondary | ICD-10-CM

## 2016-05-13 LAB — CBC WITH DIFFERENTIAL/PLATELET
BASO%: 0.6 % (ref 0.0–2.0)
BASOS ABS: 0 10*3/uL (ref 0.0–0.1)
EOS%: 2.2 % (ref 0.0–7.0)
Eosinophils Absolute: 0.1 10*3/uL (ref 0.0–0.5)
HEMATOCRIT: 40.5 % (ref 34.8–46.6)
HGB: 13.3 g/dL (ref 11.6–15.9)
LYMPH%: 29.3 % (ref 14.0–49.7)
MCH: 31 pg (ref 25.1–34.0)
MCHC: 32.9 g/dL (ref 31.5–36.0)
MCV: 94.1 fL (ref 79.5–101.0)
MONO#: 0.3 10*3/uL (ref 0.1–0.9)
MONO%: 7.6 % (ref 0.0–14.0)
NEUT#: 2.7 10*3/uL (ref 1.5–6.5)
NEUT%: 60.3 % (ref 38.4–76.8)
Platelets: 186 10*3/uL (ref 145–400)
RBC: 4.3 10*6/uL (ref 3.70–5.45)
RDW: 12.4 % (ref 11.2–14.5)
WBC: 4.5 10*3/uL (ref 3.9–10.3)
lymph#: 1.3 10*3/uL (ref 0.9–3.3)

## 2016-05-13 LAB — COMPREHENSIVE METABOLIC PANEL
ALT: 17 U/L (ref 0–55)
ANION GAP: 9 meq/L (ref 3–11)
AST: 20 U/L (ref 5–34)
Albumin: 4.3 g/dL (ref 3.5–5.0)
Alkaline Phosphatase: 73 U/L (ref 40–150)
BUN: 20.8 mg/dL (ref 7.0–26.0)
CALCIUM: 9.7 mg/dL (ref 8.4–10.4)
CHLORIDE: 105 meq/L (ref 98–109)
CO2: 29 meq/L (ref 22–29)
CREATININE: 1 mg/dL (ref 0.6–1.1)
EGFR: 65 mL/min/{1.73_m2} — ABNORMAL LOW (ref >90)
Glucose: 100 mg/dl (ref 70–140)
POTASSIUM: 4.1 meq/L (ref 3.5–5.1)
Sodium: 143 mEq/L (ref 136–145)
Total Bilirubin: 0.46 mg/dL (ref 0.20–1.20)
Total Protein: 7.8 g/dL (ref 6.4–8.3)

## 2016-05-13 NOTE — Progress Notes (Signed)
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 She saw Dr Josephina Shih [phone] and will see him again 11-15-2016. Last imaging was CT renal stone study 04-18-16.  She saw PCP following the ED visit 04-2016.  Patient is concerned about ongoing memory problems since completing treatment for the ovarian cancer. 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 She reports some days better than others, with worst days "like I am in a bad fog". No other different neurologic symptoms (slight residual peripheral neuropathy in toes from taxol). She is on stable thyroid medication. She eats small amounts thru day, lactose and gluten intolerant, sometimes goes from light evening po intake until 10 AM before eating again; blood sugars in past at this office 80s-90s. She sleeps regularly. She does yoga several times weekly. No new or different medications.   She had URI last week, resolved, no other recent infectious illness.  She was seen in ED on 04-18-16 for abdominal pain, 3 mm stone at [facility] on left with mild hydro at time of the renal CT then (after worst of pain had improved). She does not have history of nephrolithiasis prior, does have small bilateral stones in kidneys on this CT. She has had no hematuria. She has not seen nephrologist for this problem, has not had stones analyzed.   Otherwise she has no complaints that seem of concern from standpoint of the ovarian cancer or that treatment, including no other abdominal or pelvic discomfort,  bowels at baseline, no LE swelling, no bleeding, no SOB. Remainder of 10 point Review of Systems negative.    PAC removed 05-2015 Genetics testing negative 08-2013 CA 125 was elevated at 226 preoperatively (11-2012.) Flu vaccine done 05-02-15  ONCOLOGIC HISTORY Patient presented with RUQ pain in March 2014, with ED evaluation then including abdominal US not remarkable. She was then found to have pelvic mass, with MRI pelvis in [place] system 11-11-12 showing centrally necrotic pelvic mass 11.4 x 9.1 x 7.8 cm. CA 125 by Dr Dellis Filbert 11-13-2012 was 226.4. She saw Dr Josephina Shih in [place] on 11-17-12 and went to surgery by him at [facility] on 11-24-12. Surgery was complete debulking including TAH, BSO, omentectomy, right ureterolysis, bilateral pelvic lymphadenectomy and periaortic lymphadenectomy. There is no mention in the operative note of rupture of the ovarian capsule. 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 Pelvic washings ([id number]) negative. Patient was hospitalized at [facility] x 5 days; per patient and husband, she was transfused 2 units PRBCs at [facility]. She developed acute herpes zoster left T 11-12 dermatome on 12-12-12, then severe pain left inguinal region and upper lateral left thigh such that start of dose dense adjuvant taxol carboplatin was delayed x2 prior to beginning 01-05-13. She required neupogen x 1-2 days after each treatment, and carbo dose decreased to AUC =5 with cycle 2 and to AUC=3 with cycle 6 due to thrombocytopenia. Cycle 6 was completed 05-11-2013. CT AP 06-15-2013  NED.  CA 125 was 17.6 on 04-20-13.   Objective:  Vital signs in last 24 hours:  BP (!) 109/57 (BP Location: Left Arm, Patient Position: Sitting)   Pulse 64   Temp 97.7 F (36.5 C) (Oral)   Resp 18   Ht  '5\' 6"'$  (1.676 m)   Wt 167 lb 6.4 oz (75.9 kg)   LMP 10/25/2012   SpO2 100%   BMI 27.02 kg/m  Weight up 7 lbs. Alert, oriented and appropriate. Ambulatory without difficulty, easily mobile.  No alopecia  HEENT:PERRL, sclerae not icteric. Oral mucosa moist without lesions, posterior pharynx clear.  Neck supple. No JVD.  Lymphatics:no cervical,supraclavicular, axillary or inguinal adenopathy Resp: clear to auscultation bilaterally and normal percussion bilaterally Cardio: regular rate and rhythm. No gallop. GI: soft, nontender, not distended, no mass or organomegaly. Normally active bowel sounds. Surgical incision not remarkable. Musculoskeletal/ Extremities: LE without pitting edema, cords, tenderness Neuro: minimal peripheral neuropathy toes. Speech fluent, CN/ motor/cerebellar nonfocal. PSYCH appropriate mood and affect Skin without rash, ecchymosis, petechiae Breasts: without dominant mass, skin or nipple findings. Axillae benign.   Lab Results:  Results for orders placed or performed in visit on 05/13/16  CBC with Differential  Result Value Ref Range   WBC 4.5 3.9 - 10.3 10e3/uL   NEUT# 2.7 1.5 - 6.5 10e3/uL   HGB 13.3 11.6 - 15.9 g/dL   HCT 40.5 34.8 - 46.6 %   Platelets 186 145 - 400 10e3/uL   MCV 94.1 79.5 - 101.0 fL   MCH 31.0 25.1 - 34.0 pg   MCHC 32.9 31.5 - 36.0 g/dL   RBC 4.30 3.70 - 5.45 10e6/uL   RDW 12.4 11.2 - 14.5 %   lymph# 1.3 0.9 - 3.3 10e3/uL   MONO# 0.3 0.1 - 0.9 10e3/uL   Eosinophils Absolute 0.1 0.0 - 0.5 10e3/uL   Basophils Absolute 0.0 0.0 - 0.1 10e3/uL   NEUT% 60.3 38.4 - 76.8 %   LYMPH% 29.3 14.0 - 49.7 %   MONO% 7.6 0.0 - 14.0 %   EOS% 2.2 0.0 - 7.0 %   BASO% 0.6 0.0 - 2.0 %  Comprehensive metabolic panel  Result Value Ref Range   Sodium 143 136 - 145 mEq/L   Potassium 4.1 3.5 - 5.1 mEq/L   Chloride 105 98 - 109 mEq/L   CO2 29 22 - 29 mEq/L   Glucose 100 70 - 140 mg/dl   BUN 20.8 7.0 - 26.0 mg/dL   Creatinine 1.0 0.6 - 1.1 mg/dL   Total  Bilirubin 0.46 0.20 - 1.20 mg/dL   Alkaline Phosphatase 73 40 - 150 U/L   AST 20 5 - 34 U/L   ALT 17 0 - 55 U/L   Total Protein 7.8 6.4 - 8.3 g/dL   Albumin 4.3 3.5 - 5.0 g/dL   Calcium 9.7 8.4 - 10.4 mg/dL   Anion Gap 9 3 - 11 mEq/L   EGFR 65 (L) >90 ml/min/1.73 m2    CA 125 available after visit 13.4 by new lab method, corresponding to 12.6 by same method 6 mo ago (by previous lab method 9 , compared with 8 in 10-2015 and 11 in 04-2015.  Studies/Results: CT ABDOMEN AND PELVIS WITH CONTRAST  TECHNIQUE: Multidetector CT imaging of the abdomen and pelvis was performed using the standard protocol following bolus administration of intravenous contrast.  CONTRAST:  124m OMNIPAQUE IOHEXOL 300 MG/ML  SOLN  COMPARISON:  None.  FINDINGS: There is no pleural effusion identified. The lung bases  are clear. Stable low-attenuation foci within the liver which likely represent cysts. The gallbladder is normal. No biliary dilatation. Normal appearance of the pancreas. The spleen is normal.  The adrenal glands both appear normal. Normal appearance of the right kidney. The left kidney is also normal. Normal appearance of the urinary bladder.  The abdominal aorta has a normal caliber. Nonspecific soft tissue haziness along the ventral surface of the distal abdominal aorta is noted, image 39/series 2. No adenopathy identified. There is no pelvic or inguinal adenopathy identified. Resolution of previous fluid density within the left adnexal region compatible with a benign, likely postsurgical etiology.  The stomach appears normal. The small bowel loops have a normal course and caliber. Normal appearance of the colon.  No ascites or focal fluid collections identified within the abdomen or pelvis. No peritoneal nodule or mass identified.  IMPRESSION: 1. There is a subtle area of soft tissue stranding along the ventral surface of the distal abdominal aorta just above the  bifurcation. Attention to this area on followup imaging is advised. 2. The remainder of the examination is negative without specific features to suggest residual or recurrence of tumor.   PACs images reviewed with patient at visit.    Mammograms orderd for [place] 06-2016. Breast tissue not extremely dense per last report.    Medications: I have reviewed the patient's current medications.  DISCUSSION Memory concerns discussed. I have recommended neurology consultation, which she would like to do, referral made to [facility] neurology. She will keep notes for the timing and other possible associated factors for the worst episodes. She will be aware of good regular po intake. Patient's complaints are more pronounced that I am accustomed to hearing after chemo, tho she also has cognitively demanding job.   Nephrolithiasis discussed. She prefers not to see urology now. She is trying to maintain good hydration.  Clinically nothing that seems concerning for recurrent ovarian cancer. She will see Dr Josephina Shih 11-16-15, then either follow with him only or could alternate visits with medical oncology ongoing. Patient is aware that there will be a different physician in this medical oncology position after first of year.  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 She will see Dr Josephina Shih in May 2018, which will be 4 years from diagnosis, then either follow with gyn onc or could alternate with medical oncology ongoing.  2.memory concerns: patient feels these began after treatment for the ovarian cancer, but are more bothersome now. Referral to neurology.  3. Nephrolithiasis: seems to have passed the symptomatic stone after 04-2016 ED evaluation. BIlateral stones in kidneys on imaging then. 4..hypothyroid on replacement , managed by  PCP 5..lactose and gluten intolerance:intermittent abdominal/ GI symptoms seem related to this. PO intake sporadic and minimal at times, note blood sugars have been a little low at times here. 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 Used primarily phenergan for nausea during chemo  9..low back pain resolved with PT, including daily stretching exercises.  10..flu vaccine 04-29-16 11.   herpes zoster just prior to start of chemotherapy, resolved12.0Left LE weakness since surgery and zoster, essentially resolved..     All questions answered and she is in agreement with recommendations and plans above. Time spent 25 min including >50% counseling and coordination  of care.  Route PCP  Evlyn Clines, MD   05/13/2016, 5:57 PM

## 2016-05-14 ENCOUNTER — Encounter: Payer: Self-pay | Admitting: Oncology

## 2016-05-14 ENCOUNTER — Telehealth: Payer: Self-pay

## 2016-05-14 DIAGNOSIS — N200 Calculus of kidney: Secondary | ICD-10-CM | POA: Insufficient documentation

## 2016-05-14 LAB — CA 125: Cancer Antigen (CA) 125: 13.4 U/mL (ref 0.0–38.1)

## 2016-05-14 LAB — CANCER ANTIGEN 125 (PARALLEL TESTING): CA 125: 9 U/mL (ref <35)

## 2016-05-14 NOTE — Telephone Encounter (Signed)
-----   Message from Gordy Levan, MD sent at 05/14/2016  8:27 AM EDT ----- Labs seen and need follow up: please let her know CA 125 marker is in good low range (9 by previous lab method and 13 by new lab method, both stable). She should let us know if she does not hear back from [facility] neurology re appointment in next few days.

## 2016-05-15 NOTE — Telephone Encounter (Signed)
Spoke with [facility]rology.  The referral was not in their work que.  They went into chart review and under referral tab see referral.  They will print it off and contact patient.

## 2016-05-15 NOTE — Telephone Encounter (Signed)
Told Candice Zamora the results of the CA-125 as noted below by Dr. Marko Plume.

## 2016-06-29 ENCOUNTER — Other Ambulatory Visit: Payer: Self-pay | Admitting: Nurse Practitioner

## 2016-11-14 ENCOUNTER — Other Ambulatory Visit: Payer: Self-pay | Admitting: Gynecologic Oncology

## 2016-11-14 DIAGNOSIS — C569 Malignant neoplasm of unspecified ovary: Secondary | ICD-10-CM

## 2016-11-15 ENCOUNTER — Ambulatory Visit: Payer: 59 | Admitting: Gynecology

## 2016-11-15 ENCOUNTER — Other Ambulatory Visit (HOSPITAL_BASED_OUTPATIENT_CLINIC_OR_DEPARTMENT_OTHER): Payer: 59

## 2016-11-15 DIAGNOSIS — C561 Malignant neoplasm of right ovary: Secondary | ICD-10-CM

## 2016-11-15 DIAGNOSIS — C569 Malignant neoplasm of unspecified ovary: Secondary | ICD-10-CM

## 2016-11-18 LAB — CA 125: Cancer Antigen (CA) 125: 12.6 U/mL (ref 0.0–38.1)

## 2016-11-25 DIAGNOSIS — K580 Irritable bowel syndrome with diarrhea: Secondary | ICD-10-CM | POA: Diagnosis not present

## 2016-11-25 DIAGNOSIS — E039 Hypothyroidism, unspecified: Secondary | ICD-10-CM | POA: Diagnosis not present

## 2016-11-25 DIAGNOSIS — Z0000 Encounter for general adult medical examination without abnormal findings: Secondary | ICD-10-CM | POA: Diagnosis not present

## 2016-11-29 ENCOUNTER — Encounter: Payer: Self-pay | Admitting: Gynecology

## 2016-11-29 ENCOUNTER — Ambulatory Visit: Payer: 59 | Attending: Gynecology | Admitting: Gynecology

## 2016-11-29 VITALS — BP 105/45 | HR 58 | Temp 98.0°F | Resp 20 | Wt 175.9 lb

## 2016-11-29 DIAGNOSIS — Z9221 Personal history of antineoplastic chemotherapy: Secondary | ICD-10-CM | POA: Diagnosis not present

## 2016-11-29 DIAGNOSIS — Z8543 Personal history of malignant neoplasm of ovary: Secondary | ICD-10-CM | POA: Diagnosis not present

## 2016-11-29 DIAGNOSIS — Z9889 Other specified postprocedural states: Secondary | ICD-10-CM | POA: Insufficient documentation

## 2016-11-29 DIAGNOSIS — D259 Leiomyoma of uterus, unspecified: Secondary | ICD-10-CM | POA: Insufficient documentation

## 2016-11-29 DIAGNOSIS — Z79899 Other long term (current) drug therapy: Secondary | ICD-10-CM | POA: Diagnosis not present

## 2016-11-29 DIAGNOSIS — Z888 Allergy status to other drugs, medicaments and biological substances status: Secondary | ICD-10-CM | POA: Insufficient documentation

## 2016-11-29 DIAGNOSIS — C569 Malignant neoplasm of unspecified ovary: Secondary | ICD-10-CM | POA: Diagnosis present

## 2016-11-29 DIAGNOSIS — E039 Hypothyroidism, unspecified: Secondary | ICD-10-CM | POA: Insufficient documentation

## 2016-11-29 NOTE — Addendum Note (Signed)
Addended by: Joylene John D on: 11/29/2016 02:00 PM   Modules accepted: Orders

## 2016-11-29 NOTE — Patient Instructions (Addendum)
Return to see [place] in 6 months with a CA 125 before.

## 2016-11-29 NOTE — Progress Notes (Signed)
Consult Note: Gyn-Onc   Marcha Dutton 52 y.o. female  Chief Complaint  Patient presents with  . 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 Plan:  Patient returns see [place] in 6 months.  We will continue to monitor CA-125 values.  . She is up-to-date with mammograms.   Interval history The patient returns today as previously scheduled. She completed 6 cycles of adjuvant dose dense carboplatin and Taxol chemotherapy. Followup CT scan on 06/15/2013 shows no evidence of disease.     Appetite is good she has no GI or GU symptoms. She denies any pelvic symptoms.  Her functional status is excellent and she is working full time. Recent CA-125 value was 12.6 units per mL.    HPI:  61 her old white married female gravida 4 para 2 seen in consultation request of Dr.[name] regarding management of a newly diagnosed complex pelvic mass. The patient has had several months of lower abdominal pain and initially underwent a pelvic ultrasound on November 09, 2012 that showed a 10.8 x 8.7 x 9.2 cm heterogeneous pelvic mass. Small fibroids were also noted. The adnexa were poorly seen. Subsequently she underwent a MRI of the pelvis which demonstrated a centrally necrotic pelvic mass which may arise from the posterior uterus or the right adnexa. She had a normal-appearing left ovary moderate ascites and no evidence of adenopathy. CA 125 was 226 units per mL CEA 1.5 (normal) OVA1 was 9.4 (upper limits of normal for premenopausal patient is 5) Patient underwent exploratory laparotomy, TAH, BSO, omentectomy, pelvic and aortic lymphadenectomy at [facility] on 11/24/2012. She had 11 x 9 x 7 cm right adnexal mass which was densely adherent to the right pelvic sidewall (stage IIB). 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 All other biopsies including pelvic and para-aortic lymphadenectomy were negative.  Postoperatively she was treated with dose dense carboplatin  and Taxol. For 6 cycles. CT scan the end of therapy on 06/15/2013 was normal and CA 125 was 17 units per mL.   Review of Systems:10 point review of systems is negative except as noted in interval history.   Vitals: Blood pressure (!) 105/45, pulse (!) 58, temperature 98 F (36.7 C), temperature source Oral, resp. rate 20, weight 175 lb 14.4 oz (79.8 kg), last menstrual period 10/25/2012.  Physical Exam: General : The patient is a healthy woman in no acute distress.  HEENT: normocephalic, extraoccular movements normal; neck is supple without thyromegally  Lynphnodes: Supraclavicular and inguinal nodes not enlarged  Abdomen: Soft, non-tender, no ascites, no organomegally., no hernias, incision is well-healed Pelvic:  EGBUS: Normal female  Vagina: Normal, no lesions , the cuff is healed. Urethra and Bladder: Normal, non-tender  Cervix: Surgically absent   Uterus: I surgically absent   Rectal: normal sphincter ton, confirmed pelvic mass., no blood  Lower extremities: No edema or varicosities. Normal range of motion      Allergies  Allergen Reactions  . Compazine [Prochlorperazine Maleate] Other (See Comments)    Jittery  . Gluten Meal     Not celiac but pt. Does not tolerate gluten in meals.  . Lactase Swelling  . Soybean Oil Swelling  . Zofran [Ondansetron Hcl] Other (See Comments)    headache  . Sulfa Antibiotics Rash    Past Medical History:  Diagnosis Date  . Abdominal pain   . Dermoid cyst   . Ectopic pregnancy   . Goiter   . Hemorrhoids   . History  of chicken pox   . Hypothyroidism   . Infertility, female   . Ovarian cancer ([place]) 2014   serous ovarian cancer  . Pelvic mass in female     Past Surgical History:  Procedure Laterality Date  . LAPAROSCOPY FOR ECTOPIC PREGNANCY     10 years ago    Current Outpatient Prescriptions  Medication Sig Dispense Refill  . levothyroxine (SYNTHROID, LEVOTHROID) 50 MCG tablet Take 1 tablet (50 mcg total) by mouth daily.  30 tablet 0   No current facility-administered medications for this visit.     Social History   Social History  . Marital status: Married    Spouse name: N/A  . Number of children: 2  . Years of education: N/A   Occupational History  .  Unemployed   Social History Main Topics  . Smoking status: Never Smoker  . Smokeless tobacco: Never Used  . Alcohol use No  . Drug use: No  . Sexual activity: No   Other Topics Concern  . Not on file   Social History Narrative  . No narrative on file    Family History  Problem Relation Age of Onset  . Hypothyroidism Mother   . Heart disease Mother   . Ovarian cancer Mother 32       High grade serous ovarian cancer  . Heart disease Father   . Heart attack Father   . Hypothyroidism Sister   . Breast cancer Maternal Aunt 67  . Diabetes Maternal Grandfather   . Diabetes Paternal Grandmother   . Cancer Other        maternal grandmother's sister had an "abdominal" cancer in her 21s  . Cancer Other        maternal grandmother's mother had "abdominal cancer"      Marti Sleigh, MD 11/29/2016, 1:53 PM

## 2017-01-20 ENCOUNTER — Other Ambulatory Visit: Payer: Self-pay | Admitting: Family Medicine

## 2017-01-20 DIAGNOSIS — Z1231 Encounter for screening mammogram for malignant neoplasm of breast: Secondary | ICD-10-CM

## 2017-01-22 ENCOUNTER — Ambulatory Visit
Admission: RE | Admit: 2017-01-22 | Discharge: 2017-01-22 | Disposition: A | Discharge status: Home / Self Care | Payer: 59 | Source: Ambulatory Visit | Attending: Family Medicine | Admitting: Family Medicine

## 2017-01-22 DIAGNOSIS — Z1231 Encounter for screening mammogram for malignant neoplasm of breast: Secondary | ICD-10-CM

## 2017-05-14 ENCOUNTER — Other Ambulatory Visit (HOSPITAL_BASED_OUTPATIENT_CLINIC_OR_DEPARTMENT_OTHER): Payer: 59

## 2017-05-14 DIAGNOSIS — C569 Malignant neoplasm of unspecified ovary: Secondary | ICD-10-CM

## 2017-05-14 DIAGNOSIS — C561 Malignant neoplasm of right ovary: Secondary | ICD-10-CM | POA: Diagnosis not present

## 2017-05-15 LAB — CA 125: CANCER ANTIGEN (CA) 125: 12.5 U/mL (ref 0.0–38.1)

## 2017-05-16 ENCOUNTER — Encounter: Payer: Self-pay | Admitting: Gynecology

## 2017-05-16 ENCOUNTER — Ambulatory Visit: Payer: 59 | Attending: Gynecology | Admitting: Gynecology

## 2017-05-16 VITALS — BP 101/41 | HR 58 | Temp 97.6°F | Resp 20 | Ht 66.5 in | Wt 178.5 lb

## 2017-05-16 DIAGNOSIS — E039 Hypothyroidism, unspecified: Secondary | ICD-10-CM | POA: Diagnosis not present

## 2017-05-16 DIAGNOSIS — Z79899 Other long term (current) drug therapy: Secondary | ICD-10-CM | POA: Insufficient documentation

## 2017-05-16 DIAGNOSIS — Z8543 Personal history of malignant neoplasm of ovary: Secondary | ICD-10-CM | POA: Diagnosis not present

## 2017-05-16 DIAGNOSIS — C569 Malignant neoplasm of unspecified ovary: Secondary | ICD-10-CM | POA: Insufficient documentation

## 2017-05-16 NOTE — Patient Instructions (Signed)
Plan to follow up in six months with a CA 125 before.  Call for any questions, concerns, or new symptoms.

## 2017-05-16 NOTE — Progress Notes (Signed)
Consult Note: Gyn-Onc   Candice Zamora 51 y.o. female  Chief Complaint  Patient presents with  . 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 Plan:  Patient returns see [place] in 6 months.  We will continue to monitor CA-125 values.  . She is up-to-date with mammograms.   Interval history The patient returns today as previously scheduled. She completed 6 cycles of adjuvant dose dense carboplatin and Taxol chemotherapy. Followup CT scan on 06/15/2013 shows no evidence of disease.     Appetite is good she has no GI or GU symptoms. She denies any pelvic symptoms.  Her functional status is excellent and she is working full time. Recent CA-125 value was 12.5 units per mL.    HPI:  42 her old white married female gravida 4 para 2 seen in consultation request of Dr.[name] regarding management of a newly diagnosed complex pelvic mass. The patient has had several months of lower abdominal pain and initially underwent a pelvic ultrasound on November 09, 2012 that showed a 10.8 x 8.7 x 9.2 cm heterogeneous pelvic mass. Small fibroids were also noted. The adnexa were poorly seen. Subsequently she underwent a MRI of the pelvis which demonstrated a centrally necrotic pelvic mass which may arise from the posterior uterus or the right adnexa. She had a normal-appearing left ovary moderate ascites and no evidence of adenopathy. CA 125 was 226 units per mL CEA 1.5 (normal) OVA1 was 9.4 (upper limits of normal for premenopausal patient is 5) Patient underwent exploratory laparotomy, TAH, BSO, omentectomy, pelvic and aortic lymphadenectomy at [facility] on 11/24/2012. She had 11 x 9 x 7 cm right adnexal mass which was densely adherent to the right pelvic sidewall (stage IIB). 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 All other biopsies including pelvic and para-aortic lymphadenectomy were negative.  Postoperatively she was treated  with dose dense carboplatin and Taxol. For 6 cycles. CT scan the end of therapy on 06/15/2013 was normal and CA 125 was 17 units per mL.   Review of Systems:10 point review of systems is negative except as noted in interval history.   Vitals: Blood pressure (!) 101/41, pulse (!) 58, temperature 97.6 F (36.4 C), temperature source Oral, resp. rate 20, height 5' 6.5" (1.689 m), weight 178 lb 8 oz (81 kg), last menstrual period 10/25/2012, SpO2 100 %.  Physical Exam: General : The patient is a healthy woman in no acute distress.  HEENT: normocephalic, extraoccular movements normal; neck is supple without thyromegally  Lynphnodes: Supraclavicular and inguinal nodes not enlarged  Abdomen: Soft, non-tender, no ascites, no organomegally., no hernias, incision is well-healed Pelvic:  EGBUS: Normal female  Vagina: Normal, no lesions , the cuff is healed. Urethra and Bladder: Normal, non-tender  Cervix: Surgically absent   Uterus: I surgically absent   Rectal: normal sphincter ton, confirmed pelvic mass., no blood  Lower extremities: No edema or varicosities. Normal range of motion      Allergies  Allergen Reactions  . Compazine [Prochlorperazine Maleate] Other (See Comments)    Jittery  . Gluten Meal     Not celiac but pt. Does not tolerate gluten in meals.  . Lactase Swelling  . Soybean Oil Swelling  . Zofran [Ondansetron Hcl] Other (See Comments)    headache  . Sulfa Antibiotics Rash    Past Medical History:  Diagnosis Date  . Abdominal pain   . Dermoid cyst   . Ectopic pregnancy   .  Goiter   . Hemorrhoids   . History of chicken pox   . Hypothyroidism   . Infertility, female   . Ovarian cancer ([place]) 2014   serous ovarian cancer  . Pelvic mass in female     Past Surgical History:  Procedure Laterality Date  . LAPAROSCOPY FOR ECTOPIC PREGNANCY     10 years ago    Current Outpatient Prescriptions  Medication Sig Dispense Refill  . levothyroxine (SYNTHROID,  LEVOTHROID) 50 MCG tablet Take 1 tablet (50 mcg total) by mouth daily. 30 tablet 0   No current facility-administered medications for this visit.     Social History   Social History  . Marital status: Married    Spouse name: N/A  . Number of children: 2  . Years of education: N/A   Occupational History  .  Unemployed   Social History Main Topics  . Smoking status: Never Smoker  . Smokeless tobacco: Never Used  . Alcohol use No  . Drug use: No  . Sexual activity: No   Other Topics Concern  . Not on file   Social History Narrative  . No narrative on file    Family History  Problem Relation Age of Onset  . Hypothyroidism Mother   . Heart disease Mother   . Ovarian cancer Mother 26       High grade serous ovarian cancer  . Heart disease Father   . Heart attack Father   . Hypothyroidism Sister   . Breast cancer Maternal Aunt 67  . Diabetes Maternal Grandfather   . Diabetes Paternal Grandmother   . Cancer Other        maternal grandmother's sister had an "abdominal" cancer in her 57s  . Cancer Other        maternal grandmother's mother had "abdominal cancer"      Marti Sleigh, MD 05/16/2017, 1:40 PM

## 2017-11-03 ENCOUNTER — Inpatient Hospital Stay: Payer: 59 | Attending: Gynecology

## 2017-11-03 ENCOUNTER — Telehealth: Payer: Self-pay

## 2017-11-03 DIAGNOSIS — Z8543 Personal history of malignant neoplasm of ovary: Secondary | ICD-10-CM | POA: Insufficient documentation

## 2017-11-03 DIAGNOSIS — C569 Malignant neoplasm of unspecified ovary: Secondary | ICD-10-CM

## 2017-11-03 NOTE — Telephone Encounter (Signed)
Upon patient phone request to r/s lab for today at 9:15 instead of tomorrow. She was unaware of the appointment, and called due to the auto reminder call. Per 4/22 phone message return

## 2017-11-04 ENCOUNTER — Other Ambulatory Visit: Payer: Self-pay

## 2017-11-04 ENCOUNTER — Telehealth: Payer: Self-pay

## 2017-11-04 LAB — CA 125: Cancer Antigen (CA) 125: 10.7 U/mL (ref 0.0–38.1)

## 2017-11-04 NOTE — Telephone Encounter (Signed)
LM for [facility] stating that her CA-125 was good and in normal range at 10.7.  Follow up with Dr. Fermin Schwab on Friday 11-07-17.

## 2017-11-07 ENCOUNTER — Encounter: Payer: Self-pay | Admitting: Gynecology

## 2017-11-07 ENCOUNTER — Inpatient Hospital Stay (HOSPITAL_BASED_OUTPATIENT_CLINIC_OR_DEPARTMENT_OTHER): Payer: 59 | Admitting: Gynecology

## 2017-11-07 VITALS — BP 89/59 | HR 57 | Temp 97.8°F | Resp 16 | Ht 66.5 in | Wt 178.1 lb

## 2017-11-07 DIAGNOSIS — C569 Malignant neoplasm of unspecified ovary: Secondary | ICD-10-CM

## 2017-11-07 DIAGNOSIS — Z8543 Personal history of malignant neoplasm of ovary: Secondary | ICD-10-CM | POA: Diagnosis not present

## 2017-11-07 NOTE — Progress Notes (Signed)
Consult Note: Gyn-Onc   Candice Zamora 52 y.o. female  Chief Complaint  Patient presents with  . 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 Plan:  Patient returns see [place] in 12 months.  We will continue to monitor CA-125 values.  . She is up-to-date with mammograms.   Interval history The patient returns today as previously scheduled. She completed 6 cycles of adjuvant dose dense carboplatin and Taxol chemotherapy. Followup CT scan on 06/15/2013 shows no evidence of disease.     Appetite is good she has no GI or GU symptoms. She denies any pelvic symptoms.  Her functional status is excellent and she is working full time. Recent CA-125 value was 10.7 units per mL.    HPI:  51 her old white married female gravida 4 para 2 seen in consultation request of Dr.[name] regarding management of a newly diagnosed complex pelvic mass. The patient has had several months of lower abdominal pain and initially underwent a pelvic ultrasound on November 09, 2012 that showed a 10.8 x 8.7 x 9.2 cm heterogeneous pelvic mass. Small fibroids were also noted. The adnexa were poorly seen. Subsequently she underwent a MRI of the pelvis which demonstrated a centrally necrotic pelvic mass which may arise from the posterior uterus or the right adnexa. She had a normal-appearing left ovary moderate ascites and no evidence of adenopathy. CA 125 was 226 units per mL CEA 1.5 (normal) OVA1 was 9.4 (upper limits of normal for premenopausal patient is 5) Patient underwent exploratory laparotomy, TAH, BSO, omentectomy, pelvic and aortic lymphadenectomy at [facility] on 11/24/2012. She had 11 x 9 x 7 cm right adnexal mass which was densely adherent to the right pelvic sidewall (stage IIB). 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 All other biopsies including pelvic and para-aortic lymphadenectomy were negative.  Postoperatively she was  treated with dose dense carboplatin and Taxol. For 6 cycles. CT scan the end of therapy on 06/15/2013 was normal and CA 125 was 17 units per mL.   Review of Systems:10 point review of systems is negative except as noted in interval history.   Vitals: Blood pressure (!) 89/59, pulse (!) 57, temperature 97.8 F (36.6 C), temperature source Oral, resp. rate 16, height 5' 6.5" (1.689 m), weight 178 lb 1.6 oz (80.8 kg), last menstrual period 10/25/2012, SpO2 100 %.  Physical Exam: General : The patient is a healthy woman in no acute distress.  HEENT: normocephalic, extraoccular movements normal; neck is supple without thyromegally  Lynphnodes: Supraclavicular and inguinal nodes not enlarged  Abdomen: Soft, non-tender, no ascites, no organomegally., no hernias, incision is well-healed Pelvic:  EGBUS: Normal female  Vagina: Normal, no lesions , the cuff is healed. Urethra and Bladder: Normal, non-tender  Cervix: Surgically absent   Uterus: I surgically absent   Rectal: normal sphincter ton, confirmed pelvic mass., no blood  Lower extremities: No edema or varicosities. Normal range of motion      Allergies  Allergen Reactions  . Compazine [Prochlorperazine Maleate] Other (See Comments)    Jittery  . Gluten Meal     Not celiac but pt. Does not tolerate gluten in meals.  . Lactase Swelling  . Soybean Oil Swelling  . Zofran [Ondansetron Hcl] Other (See Comments)    headache  . Sulfa Antibiotics Rash    Past Medical History:  Diagnosis Date  . Abdominal pain   . Dermoid cyst   . Ectopic pregnancy   .  Goiter   . Hemorrhoids   . History of chicken pox   . Hypothyroidism   . Infertility, female   . Ovarian cancer ([place]) 2014   serous ovarian cancer  . Pelvic mass in female     Past Surgical History:  Procedure Laterality Date  . LAPAROSCOPY FOR ECTOPIC PREGNANCY     10 years ago    Current Outpatient Medications  Medication Sig Dispense Refill  . levothyroxine  (SYNTHROID, LEVOTHROID) 50 MCG tablet Take 1 tablet (50 mcg total) by mouth daily. 30 tablet 0   No current facility-administered medications for this visit.     Social History   Socioeconomic History  . Marital status: Married    Spouse name: Not on file  . Number of children: 2  . Years of education: Not on file  . Highest education level: Not on file  Occupational History    Employer: UNEMPLOYED  Social Needs  . Financial resource strain: Not on file  . Food insecurity:    Worry: Not on file    Inability: Not on file  . Transportation needs:    Medical: Not on file    Non-medical: Not on file  Tobacco Use  . Smoking status: Never Smoker  . Smokeless tobacco: Never Used  Substance and Sexual Activity  . Alcohol use: No  . Drug use: No  . Sexual activity: Never  Lifestyle  . Physical activity:    Days per week: Not on file    Minutes per session: Not on file  . Stress: Not on file  Relationships  . Social connections:    Talks on phone: Not on file    Gets together: Not on file    Attends religious service: Not on file    Active member of club or organization: Not on file    Attends meetings of clubs or organizations: Not on file    Relationship status: Not on file  . Intimate partner violence:    Fear of current or ex partner: Not on file    Emotionally abused: Not on file    Physically abused: Not on file    Forced sexual activity: Not on file  Other Topics Concern  . Not on file  Social History Narrative  . Not on file    Family History  Problem Relation Age of Onset  . Hypothyroidism Mother   . Heart disease Mother   . Ovarian cancer Mother 63       High grade serous ovarian cancer  . Heart disease Father   . Heart attack Father   . Hypothyroidism Sister   . Breast cancer Maternal Aunt 67  . Diabetes Maternal Grandfather   . Diabetes Paternal Grandmother   . Cancer Other        maternal grandmother's sister had an "abdominal" cancer in her 31s   . Cancer Other        maternal grandmother's mother had "abdominal cancer"      Marti Sleigh, MD 11/07/2017, 1:30 PM

## 2017-11-07 NOTE — Patient Instructions (Signed)
Plan to follow up in one year with a CA 125 before.  Please call our office in November or December of 2019 at [phone] to schedule an appointment for April 2020.  Please call for any needs, concerns, or new symptoms.

## 2017-11-20 DIAGNOSIS — H353112 Nonexudative age-related macular degeneration, right eye, intermediate dry stage: Secondary | ICD-10-CM | POA: Diagnosis not present

## 2017-11-20 DIAGNOSIS — H353121 Nonexudative age-related macular degeneration, left eye, early dry stage: Secondary | ICD-10-CM | POA: Diagnosis not present

## 2017-12-30 ENCOUNTER — Other Ambulatory Visit: Payer: Self-pay | Admitting: Family Medicine

## 2017-12-30 DIAGNOSIS — Z1231 Encounter for screening mammogram for malignant neoplasm of breast: Secondary | ICD-10-CM

## 2018-01-10 DIAGNOSIS — H01001 Unspecified blepharitis right upper eyelid: Secondary | ICD-10-CM | POA: Diagnosis not present

## 2018-01-10 DIAGNOSIS — H00021 Hordeolum internum right upper eyelid: Secondary | ICD-10-CM | POA: Diagnosis not present

## 2018-01-12 DIAGNOSIS — Z0000 Encounter for general adult medical examination without abnormal findings: Secondary | ICD-10-CM | POA: Diagnosis not present

## 2018-01-12 DIAGNOSIS — E039 Hypothyroidism, unspecified: Secondary | ICD-10-CM | POA: Diagnosis not present

## 2018-01-12 DIAGNOSIS — Z136 Encounter for screening for cardiovascular disorders: Secondary | ICD-10-CM | POA: Diagnosis not present

## 2018-01-12 DIAGNOSIS — Z131 Encounter for screening for diabetes mellitus: Secondary | ICD-10-CM | POA: Diagnosis not present

## 2018-01-23 ENCOUNTER — Ambulatory Visit
Admission: RE | Admit: 2018-01-23 | Discharge: 2018-01-23 | Disposition: A | Discharge status: Home / Self Care | Payer: 59 | Source: Ambulatory Visit | Attending: Family Medicine | Admitting: Family Medicine

## 2018-01-23 DIAGNOSIS — Z1231 Encounter for screening mammogram for malignant neoplasm of breast: Secondary | ICD-10-CM | POA: Diagnosis not present

## 2018-01-30 DIAGNOSIS — L308 Other specified dermatitis: Secondary | ICD-10-CM | POA: Diagnosis not present

## 2018-05-30 DIAGNOSIS — Z23 Encounter for immunization: Secondary | ICD-10-CM | POA: Diagnosis not present

## 2018-07-03 ENCOUNTER — Ambulatory Visit
Admission: RE | Admit: 2018-07-03 | Discharge: 2018-07-03 | Disposition: A | Discharge status: Home / Self Care | Payer: No Typology Code available for payment source | Source: Ambulatory Visit | Attending: Nurse Practitioner | Admitting: Nurse Practitioner

## 2018-07-03 ENCOUNTER — Other Ambulatory Visit: Payer: Self-pay | Admitting: Nurse Practitioner

## 2018-07-03 DIAGNOSIS — M25512 Pain in left shoulder: Secondary | ICD-10-CM

## 2019-04-15 ENCOUNTER — Other Ambulatory Visit: Payer: Self-pay | Admitting: Family Medicine

## 2019-04-15 DIAGNOSIS — Z1231 Encounter for screening mammogram for malignant neoplasm of breast: Secondary | ICD-10-CM

## 2019-06-03 ENCOUNTER — Ambulatory Visit
Admission: RE | Admit: 2019-06-03 | Discharge: 2019-06-03 | Disposition: A | Discharge status: Home / Self Care | Payer: 59 | Source: Ambulatory Visit | Attending: Family Medicine | Admitting: Family Medicine

## 2019-06-03 ENCOUNTER — Other Ambulatory Visit: Payer: Self-pay

## 2019-06-03 DIAGNOSIS — Z1231 Encounter for screening mammogram for malignant neoplasm of breast: Secondary | ICD-10-CM

## 2019-06-23 ENCOUNTER — Telehealth: Payer: Self-pay | Admitting: *Deleted

## 2019-06-23 NOTE — Telephone Encounter (Signed)
Patient called and scheduled a follow up ap pt for the end of December

## 2019-07-13 NOTE — Progress Notes (Signed)
Gynecologic Oncology Return Clinic Visit  07/14/19   Reason for Visit: surveillance visit for history of stage IIB HG endometrioid adenocarcinoma of the ovary.  Treatment History: 11/2012 - primary debulking surgery - TAH/BSO, omentectomy, pelvic and para-aortic LND  Adjuvant chemo with dose-dense carboplatin, Taxol  Interval History: Patient is overall doing well since her last visit.  She is being treated for frozen shoulder with injections as well as physical therapy.  She notes being up-to-date on her mammogram and colonoscopy.  She reports having a good appetite without nausea or vomiting.  She denies early satiety or abdominal pain/bloating.  She denies any vaginal bleeding or discharge.  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  Past Medical/Surgical History: Past Medical History:  Diagnosis Date  . Abdominal pain   . Dermoid cyst   . Ectopic pregnancy   . Frozen shoulder   . Goiter   . Hemorrhoids   . History of chicken pox   . Hypothyroidism   . Infertility, female   . Ovarian cancer ([place]) 2014   serous ovarian cancer  . Pelvic mass in female     Past Surgical History:  Procedure Laterality Date  . LAPAROSCOPY FOR ECTOPIC PREGNANCY     10 years ago    Family History  Problem Relation Age of Onset  . Hypothyroidism Mother   . Heart disease Mother   . Ovarian cancer Mother 47       High grade serous ovarian cancer  . Heart disease Father   . Heart attack Father   . Hypothyroidism Sister   . Breast cancer Maternal Aunt 67  . Diabetes Maternal Grandfather   . Diabetes Paternal Grandmother   . Cancer Other        maternal grandmother's sister had an "abdominal" cancer in her 26s  . Cancer Other        maternal grandmother's mother had "abdominal cancer"    Social History   Socioeconomic History  . Marital status: Married    Spouse name: Not on file  . Number of children: 2  .  Years of education: Not on file  . Highest education level: Not on file  Occupational History    Employer: UNEMPLOYED  Tobacco Use  . Smoking status: Never Smoker  . Smokeless tobacco: Never Used  Substance and Sexual Activity  . Alcohol use: No  . Drug use: No  . Sexual activity: Never  Other Topics Concern  . Not on file  Social History Narrative  . Not on file   Social Determinants of Health   Financial Resource Strain:   . Difficulty of Paying Living Expenses: Not on file  Food Insecurity:   . Worried About Charity fundraiser in the Last Year: Not on file  . Ran Out of Food in the Last Year: Not on file  Transportation Needs:   . Lack of Transportation (Medical): Not on file  . Lack of Transportation (Non-Medical): Not on file  Physical Activity:   . Days of Exercise per Week: Not on file  . Minutes of Exercise per Session: Not on file  Stress:   . Feeling of Stress : Not on file  Social Connections:   . Frequency of Communication with Friends and Family: Not on file  . Frequency of Social Gatherings with Friends and Family: Not on file  . Attends Religious Services: Not on file  . Active Member of Clubs  or Organizations: Not on file  . Attends Archivist Meetings: Not on file  . Marital Status: Not on file    Current Medications:  Current Outpatient Medications:  .  levothyroxine (SYNTHROID, LEVOTHROID) 50 MCG tablet, Take 1 tablet (50 mcg total) by mouth daily., Disp: 30 tablet, Rfl: 0  Review of Symptoms: Pertinent positives as per HPI, otherwise review of systems negative. Denies appetite changes, fevers, chills, fatigue, unexplained weight changes. Denies hearing loss, neck lumps or masses, mouth sores, ringing in ears or voice changes. Denies cough or wheezing.  Denies shortness of breath. Denies chest pain or palpitations. Denies leg swelling. Denies abdominal distention, pain, blood in stools, constipation, diarrhea, nausea, vomiting, or early  satiety. Denies pain with intercourse, dysuria, frequency, hematuria or incontinence. Denies hot flashes, pelvic pain, vaginal bleeding or vaginal discharge.   Denies joint pain, back pain or muscle pain/cramps. Denies itching, rash, or wounds. Denies dizziness, headaches, numbness or seizures. Denies swollen lymph nodes or glands, denies easy bruising or bleeding. Denies anxiety, depression, confusion, or decreased concentration.  Physical Exam: BP 110/76 (BP Location: Left Arm, Patient Position: Sitting)   Pulse 72   Temp 98.2 F (36.8 C) (Temporal)   Resp 18   Ht 5' 6.5" (1.689 m)   Wt 177 lb 9 oz (80.5 kg)   LMP 10/25/2012   SpO2 99%   BMI 28.23 kg/m  General: Alert, oriented, no acute distress. HEENT: Posterior oropharynx clear, sclera anicteric. Chest: Clear to auscultation bilaterally.  No wheezes rhonchi or rales. Cardiovascular: Regular rate and rhythm, no murmurs. Abdomen: soft, nontender.  Normoactive bowel sounds.  No masses or hepatosplenomegaly appreciated.  Well-healed scar. Extremities: Grossly normal range of motion.  Warm, well perfused.  No edema bilaterally. Skin: No rashes or lesions noted. Lymphatics: No cervical, supraclavicular, or inguinal adenopathy. GU: Normal appearing external genitalia without erythema, excoriation, or lesions.  Speculum exam reveals mildly atrophic vaginal mucosa, no lesions or discharge.  Bimanual exam reveals cuff intact, no nodularity.  Rectovaginal exam confirms these findings.  Laboratory & Radiologic Studies: None new  Assessment & Plan: Candice Zamora is a 53 y.o. 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    Patient continues to be NED.  We discussed continued surveillance which can be with me or with a gynecologist including a yearly visit with exam.  I am happy to continue CA-125 testing yearly  as well although I think this has low yield.  The patient would like to get on a CA-125 this year and we will readdress next year her desire to continue testing.  We reviewed symptoms that should prompt a phone call earlier than her next visit.  In terms of her memory loss, we discussed that this can be a side effect from chemotherapy although many patients have improvement after they complete treatment.  The patient is going to start studying Spanish again with her children for the spring semester.  I encouraged her that this may help with her memory and that she could also try memory games and puzzles.  Jeral Pinch, MD  Division of Gynecologic Oncology  Department of Obstetrics and Gynecology  [facility]

## 2019-07-14 ENCOUNTER — Inpatient Hospital Stay: Payer: 59 | Attending: Gynecologic Oncology | Admitting: Gynecologic Oncology

## 2019-07-14 ENCOUNTER — Other Ambulatory Visit: Payer: Self-pay

## 2019-07-14 ENCOUNTER — Encounter: Payer: Self-pay | Admitting: Gynecologic Oncology

## 2019-07-14 VITALS — BP 110/76 | HR 72 | Temp 98.2°F | Resp 18 | Ht 66.5 in | Wt 177.6 lb

## 2019-07-14 DIAGNOSIS — Z90722 Acquired absence of ovaries, bilateral: Secondary | ICD-10-CM | POA: Insufficient documentation

## 2019-07-14 DIAGNOSIS — R413 Other amnesia: Secondary | ICD-10-CM | POA: Diagnosis not present

## 2019-07-14 DIAGNOSIS — Z808 Family history of malignant neoplasm of other organs or systems: Secondary | ICD-10-CM | POA: Insufficient documentation

## 2019-07-14 DIAGNOSIS — Z9221 Personal history of antineoplastic chemotherapy: Secondary | ICD-10-CM | POA: Diagnosis not present

## 2019-07-14 DIAGNOSIS — Z90710 Acquired absence of both cervix and uterus: Secondary | ICD-10-CM | POA: Diagnosis not present

## 2019-07-14 DIAGNOSIS — Z8543 Personal history of malignant neoplasm of ovary: Secondary | ICD-10-CM | POA: Insufficient documentation

## 2019-07-14 DIAGNOSIS — Z803 Family history of malignant neoplasm of breast: Secondary | ICD-10-CM | POA: Insufficient documentation

## 2019-07-14 DIAGNOSIS — C569 Malignant neoplasm of unspecified ovary: Secondary | ICD-10-CM

## 2019-07-14 NOTE — Patient Instructions (Signed)
It was a pleasure meeting you today.  I will plan to see you in a year for repeat visit and exam.  I will call you with the results of your CA-125 once they return.  If you develop abdominal pain, bloating, or other symptoms prior to your next visit, please call [phone].  Schedules are not out past July.  Please call in March or April 2021 to get your next visit with me scheduled.

## 2019-07-15 LAB — CA 125: Cancer Antigen (CA) 125: 12.4 U/mL (ref 0.0–38.1)

## 2019-12-14 ENCOUNTER — Telehealth: Payer: Self-pay | Admitting: *Deleted

## 2019-12-14 NOTE — Telephone Encounter (Signed)
Called and left the patient a message to call the office back. Need to be scheduled for a follow up in December

## 2019-12-15 NOTE — Telephone Encounter (Signed)
Called the patient back today and scheduled an appt for 12/3

## 2020-04-20 ENCOUNTER — Other Ambulatory Visit: Payer: Self-pay | Admitting: Family Medicine

## 2020-04-20 DIAGNOSIS — Z1231 Encounter for screening mammogram for malignant neoplasm of breast: Secondary | ICD-10-CM

## 2020-06-05 ENCOUNTER — Ambulatory Visit
Admission: RE | Admit: 2020-06-05 | Discharge: 2020-06-05 | Disposition: A | Discharge status: Home / Self Care | Payer: 59 | Source: Ambulatory Visit | Attending: Family Medicine | Admitting: Family Medicine

## 2020-06-05 ENCOUNTER — Other Ambulatory Visit: Payer: Self-pay

## 2020-06-05 DIAGNOSIS — Z1231 Encounter for screening mammogram for malignant neoplasm of breast: Secondary | ICD-10-CM

## 2020-06-15 ENCOUNTER — Other Ambulatory Visit: Payer: Self-pay | Admitting: Gynecologic Oncology

## 2020-06-15 ENCOUNTER — Encounter: Payer: Self-pay | Admitting: Gynecologic Oncology

## 2020-06-15 DIAGNOSIS — C569 Malignant neoplasm of unspecified ovary: Secondary | ICD-10-CM

## 2020-06-16 ENCOUNTER — Inpatient Hospital Stay: Payer: 59 | Attending: Gynecologic Oncology | Admitting: Gynecologic Oncology

## 2020-06-16 ENCOUNTER — Other Ambulatory Visit: Payer: Self-pay

## 2020-06-16 ENCOUNTER — Encounter: Payer: Self-pay | Admitting: Gynecologic Oncology

## 2020-06-16 ENCOUNTER — Inpatient Hospital Stay: Payer: 59

## 2020-06-16 VITALS — BP 125/62 | HR 68 | Temp 97.2°F | Resp 18 | Ht 66.5 in | Wt 188.6 lb

## 2020-06-16 DIAGNOSIS — C569 Malignant neoplasm of unspecified ovary: Secondary | ICD-10-CM

## 2020-06-16 DIAGNOSIS — Z8543 Personal history of malignant neoplasm of ovary: Secondary | ICD-10-CM | POA: Insufficient documentation

## 2020-06-16 DIAGNOSIS — Z9221 Personal history of antineoplastic chemotherapy: Secondary | ICD-10-CM | POA: Insufficient documentation

## 2020-06-16 DIAGNOSIS — Z08 Encounter for follow-up examination after completed treatment for malignant neoplasm: Secondary | ICD-10-CM | POA: Diagnosis not present

## 2020-06-16 NOTE — Progress Notes (Signed)
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  Treatment History: 11/2012 - primary debulking surgery - TAH/BSO, omentectomy, pelvic and para-aortic LND  Adjuvant chemo with dose-dense carboplatin, Taxol   Ref Range & Units 11 mo ago  (07/14/19) 2 yr ago  (11/03/17) 3 yr ago  (05/14/17) 3 yr ago  (11/15/16)  Cancer Antigen (CA) 125 0.0 - 38.1 U/mL 12.4  10.7 CM  12.5 CM  12.6 CM     Ref Range & Units 4 yr ago  (05/13/16) 4 yr ago  (11/02/15) 5 yr ago  (05/15/15) 5 yr ago  (02/03/15)  CA 125 <35 U/mL 9  8 CM  11 CM  8 CM     Ref Range & Units 5 yr ago  (10/27/14) 5 yr ago  (08/26/14) 5 yr ago  (08/26/14) 6 yr ago  (05/16/14) 6 yr ago  (05/16/14)  CA 125 <35 U/mL 7  7.3 R, CM  9 CM  6.7 R, CM  8 CM     Ref Range & Units 6 yr ago  (01/28/14) 6 yr ago  (12/13/13) 6 yr ago  (10/22/13) 6 yr ago  (09/13/13)  CA 125 0.0 - 30.2 U/mL 7.9  8.1  7.8  7.3     Ref Range & Units 6 yr ago  (08/02/13) 7 yr ago  (04/20/13) 7 yr ago  (03/30/13) 7 yr ago  (03/09/13)  CA 125 0.0 - 30.2 U/mL 6.7  17.6  19.9  40.3High     Ref Range & Units 7 yr ago  (02/09/13) 7 yr ago  (01/19/13) 7 yr ago  (12/28/12) 7 yr ago  (12/08/12)  CA 125 0.0 - 30.2 U/mL 37.7High  29.9  28.8  157.2High    Interval History: The patient presents today for follow-up.  Her last visit was a year ago.  She reports overall doing very well.  She still has some numbness in the tips of her fingers and toes, relatively unchanged from last year.  She notes that her hands seem to fall asleep a little bit more regularly at night depending on her positioning.  She had a mammogram this year and colonoscopy 4 years ago.  She endorses a good appetite, denies any change in weight, nausea, emesis, or early satiety.  She reports regular bowel and bladder function.  She denies any vaginal bleeding or discharge.  Past Medical/Surgical  History: Past Medical History:  Diagnosis Date   Abdominal pain    Dermoid cyst    Ectopic pregnancy    Frozen shoulder    Goiter    Hemorrhoids    History of chicken pox    Hypothyroidism    Infertility, female    Ovarian cancer ([place]) 2014   serous ovarian cancer   Pelvic mass in female     Past Surgical History:  Procedure Laterality Date   LAPAROSCOPY FOR ECTOPIC PREGNANCY     10 years ago    Family History  Problem Relation Age of Onset   Hypothyroidism Mother    Heart disease Mother    Ovarian cancer Mother 22       High grade serous ovarian cancer   Heart disease Father    Heart attack Father    Hypothyroidism Sister    Breast cancer Maternal Aunt 67   Diabetes Maternal Grandfather    Diabetes Paternal Grandmother    Cancer Other  maternal grandmother's sister had an "abdominal" cancer in her 67s   Cancer Other        maternal grandmother's mother had "abdominal cancer"    Social History   Socioeconomic History   Marital status: Married    Spouse name: Not on file   Number of children: 2   Years of education: Not on file   Highest education level: Not on file  Occupational History    Employer: UNEMPLOYED  Tobacco Use   Smoking status: Never Smoker   Smokeless tobacco: Never Used  Scientific laboratory technician Use: Never used  Substance and Sexual Activity   Alcohol use: No   Drug use: No   Sexual activity: Never  Other Topics Concern   Not on file  Social History Narrative   Not on file   Social Determinants of Health   Financial Resource Strain:    Difficulty of Paying Living Expenses: Not on file  Food Insecurity:    Worried About Charity fundraiser in the Last Year: Not on file   [employer] of Food in the Last Year: Not on file  Transportation Needs:    Lack of Transportation (Medical): Not on file   Lack of Transportation (Non-Medical): Not on file  Physical Activity:    Days of Exercise per Week:  Not on file   Minutes of Exercise per Session: Not on file  Stress:    Feeling of Stress : Not on file  Social Connections:    Frequency of Communication with Friends and Family: Not on file   Frequency of Social Gatherings with Friends and Family: Not on file   Attends Religious Services: Not on file   Active Member of Clubs or Organizations: Not on file   Attends Archivist Meetings: Not on file   Marital Status: Not on file    Current Medications:  Current Outpatient Medications:    betamethasone dipropionate 0.05 % cream, Apply topically as needed., Disp: , Rfl:    levothyroxine (SYNTHROID, LEVOTHROID) 50 MCG tablet, Take 1 tablet (50 mcg total) by mouth daily., Disp: 30 tablet, Rfl: 0  Review of Systems: Denies appetite changes, fevers, chills, fatigue, unexplained weight changes. Denies hearing loss, neck lumps or masses, mouth sores, ringing in ears or voice changes. Denies cough or wheezing.  Denies shortness of breath. Denies chest pain or palpitations. Denies leg swelling. Denies abdominal distention, pain, blood in stools, constipation, diarrhea, nausea, vomiting, or early satiety. Denies pain with intercourse, dysuria, frequency, hematuria or incontinence. Denies hot flashes, pelvic pain, vaginal bleeding or vaginal discharge.   Denies joint pain, back pain or muscle pain/cramps. Denies itching, rash, or wounds. Denies dizziness, headaches, numbness or seizures. Denies swollen lymph nodes or glands, denies easy bruising or bleeding. Denies anxiety, depression, confusion, or decreased concentration.  Physical Exam: BP 125/62 (BP Location: Left Arm, Patient Position: Sitting)    Pulse 68    Temp (!) 97.2 F (36.2 C) (Tympanic)    Resp 18    Ht 5' 6.5" (1.689 m)    Wt 188 lb 9.6 oz (85.5 kg)    LMP 10/25/2012    SpO2 100% Comment: RA   BMI 29.98 kg/m  General: Alert, oriented, no acute distress. HEENT: Normocephalic, atraumatic, sclera  anicteric. Chest: Unlabored breathing on room air. Abdomen: soft, nontender.  Normoactive bowel sounds.  No masses or hepatosplenomegaly appreciated.  Well-healed scar. Extremities: Grossly normal range of motion.  Warm, well perfused.  No edema bilaterally. Skin:  No rashes or lesions noted. Lymphatics: No cervical, supraclavicular, or inguinal adenopathy. GU: Normal appearing external genitalia without erythema, excoriation, or lesions.  Speculum exam reveals mildly atrophic vaginal mucosa, no masses or lesions.  Bimanual exam reveals cuff intact, smooth, no nodularity, no masses.  Rectovaginal exam confirms findings.  Laboratory & Radiologic Studies: None new  Assessment & Plan: Candice Zamora is a 54 y.o. 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    Patient is overall doing quite well.  We will plan to get a CA-125 today.  At her last visit, we had discussed possibly continuing to get CA 125 versus discontinuing this.  The patient would like to continue getting the labs checked yearly knowing that there may be some false positives and anxiety caused given the value.  We reviewed symptoms that should prompt a phone call earlier than her next visit. We will plan for continued yearly visits.  28 minutes of total time was spent for this patient encounter, including preparation, face-to-face counseling with the patient and coordination of care, and documentation of the encounter.  Jeral Pinch, MD  Division of Gynecologic Oncology  Department of Obstetrics and Gynecology  [facility]

## 2020-06-16 NOTE — Patient Instructions (Signed)
You are doing great! Your exam is normal.  Please call if you have any symptoms that come up in the next year. Otherwise, I will see you in December 2022.

## 2020-06-17 LAB — CA 125: Cancer Antigen (CA) 125: 15.1 U/mL (ref 0.0–38.1)

## 2020-06-19 ENCOUNTER — Telehealth: Payer: Self-pay

## 2020-06-19 NOTE — Telephone Encounter (Signed)
Told Ms Hogsett that the CA-125 was stable and in normal range at 15.1 Pt verbalized understanding.

## 2021-06-18 ENCOUNTER — Ambulatory Visit
Admission: RE | Admit: 2021-06-18 | Discharge: 2021-06-18 | Disposition: A | Discharge status: Home / Self Care | Payer: 59 | Source: Ambulatory Visit | Attending: Sports Medicine | Admitting: Sports Medicine

## 2021-06-18 ENCOUNTER — Other Ambulatory Visit: Payer: Self-pay | Admitting: Sports Medicine

## 2021-06-18 DIAGNOSIS — M25561 Pain in right knee: Secondary | ICD-10-CM

## 2021-06-18 DIAGNOSIS — M25551 Pain in right hip: Secondary | ICD-10-CM

## 2022-04-17 IMAGING — CR DG KNEE 3 VIEWS*R*
3 series · 3 of 3 positions shown · non-contrast
Comparison: None.

CLINICAL DATA: Right knee pain.  Patient reports chronic pain.

EXAM:
RIGHT KNEE - 3 VIEW

[w knee ap right]
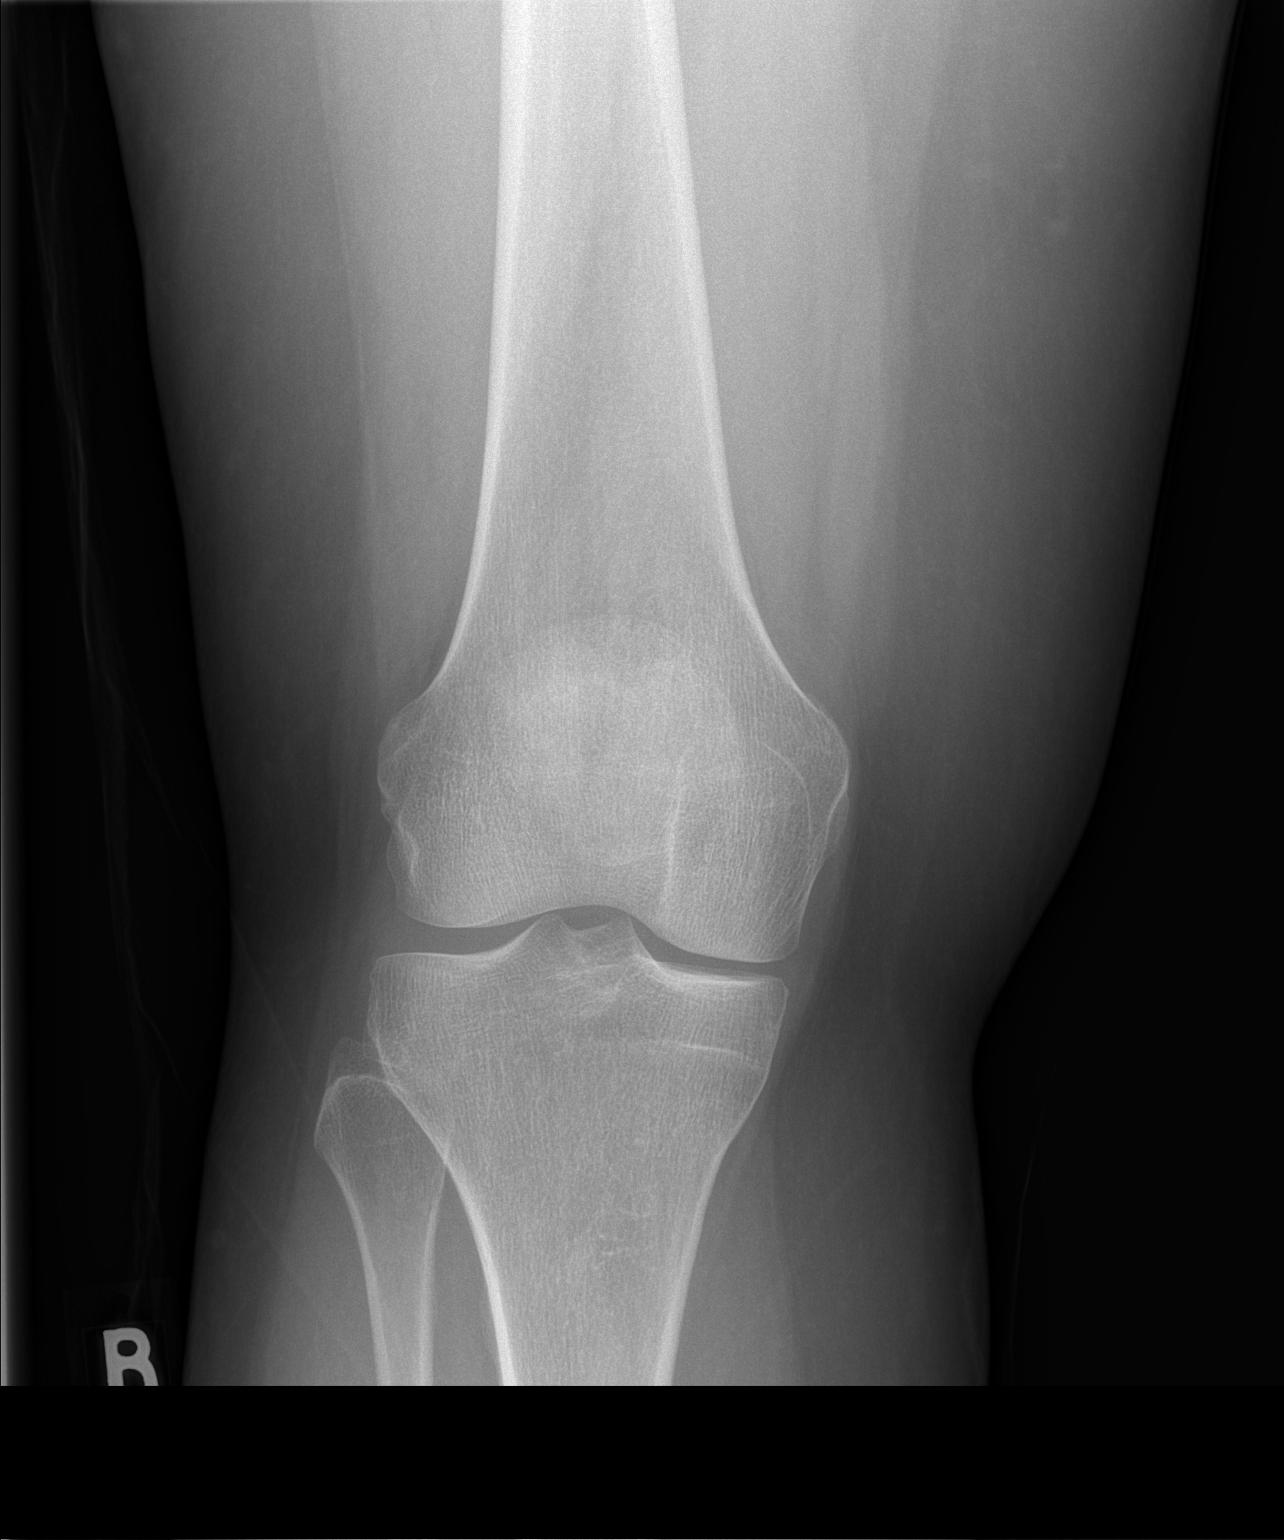

[w knee lat. right]
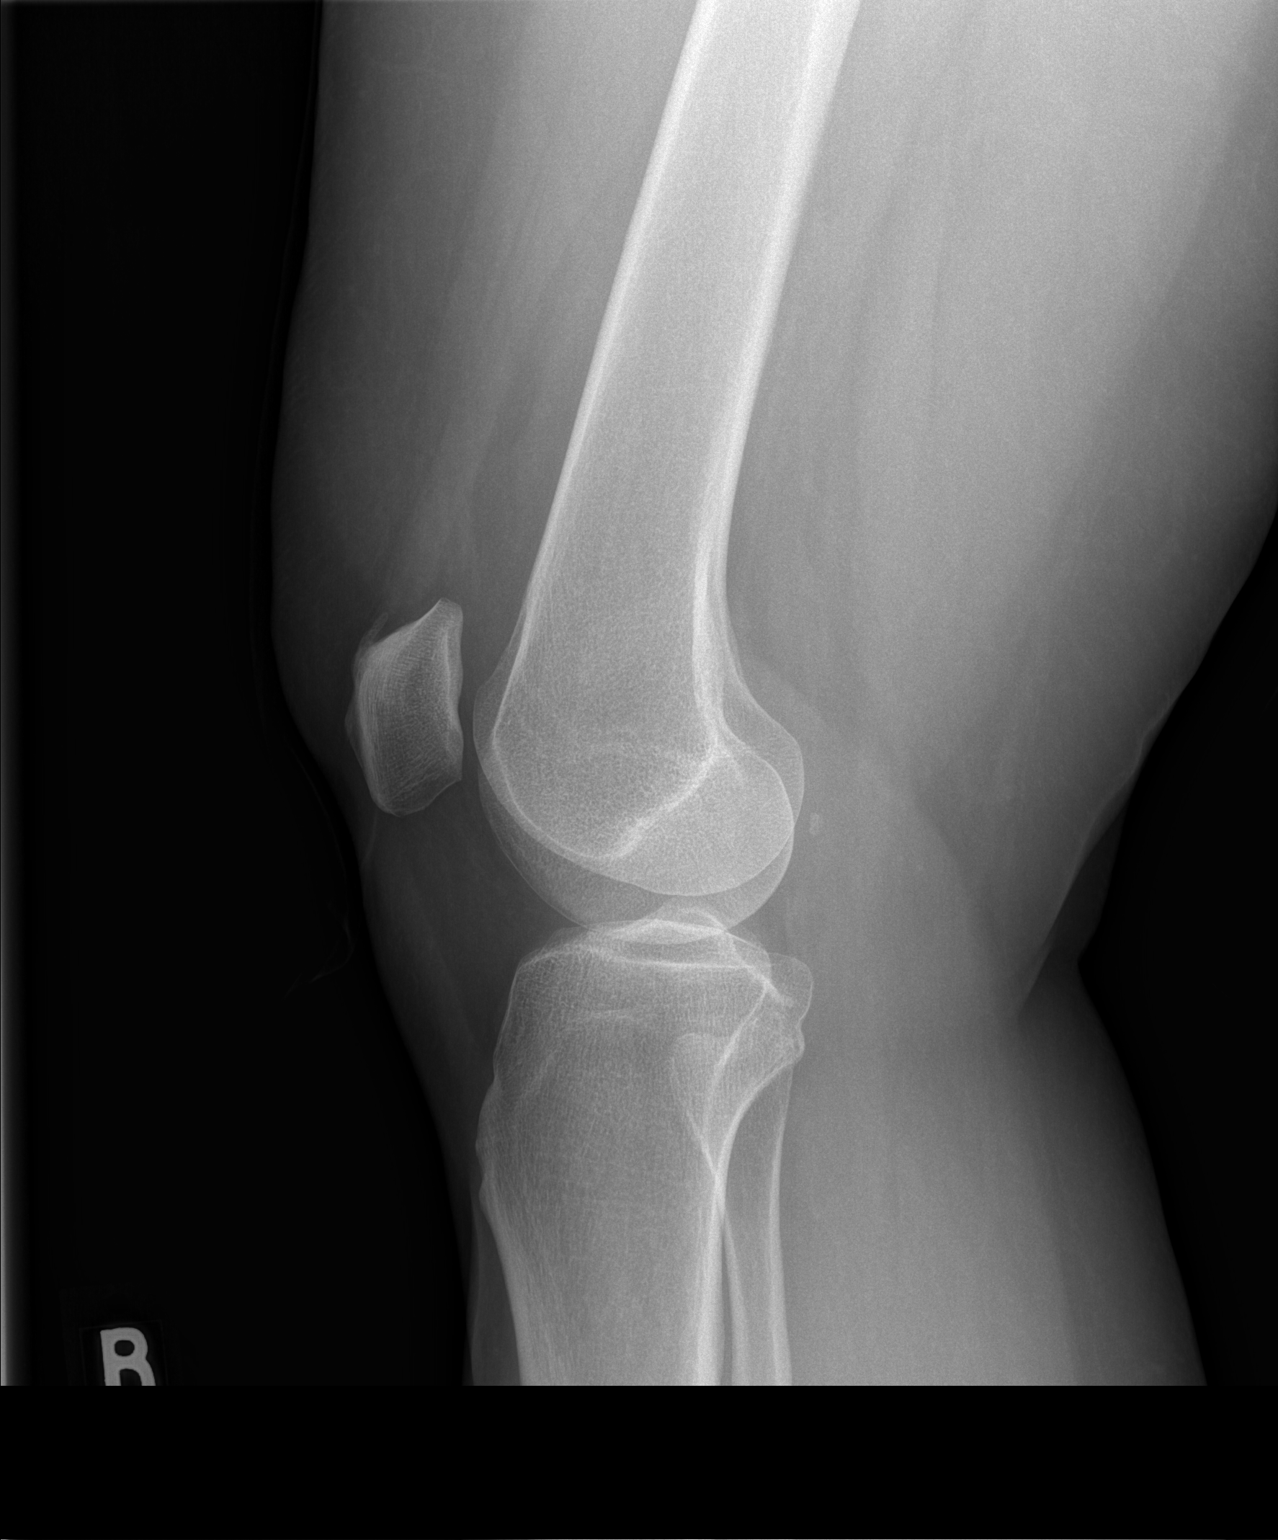

[t patella  right]
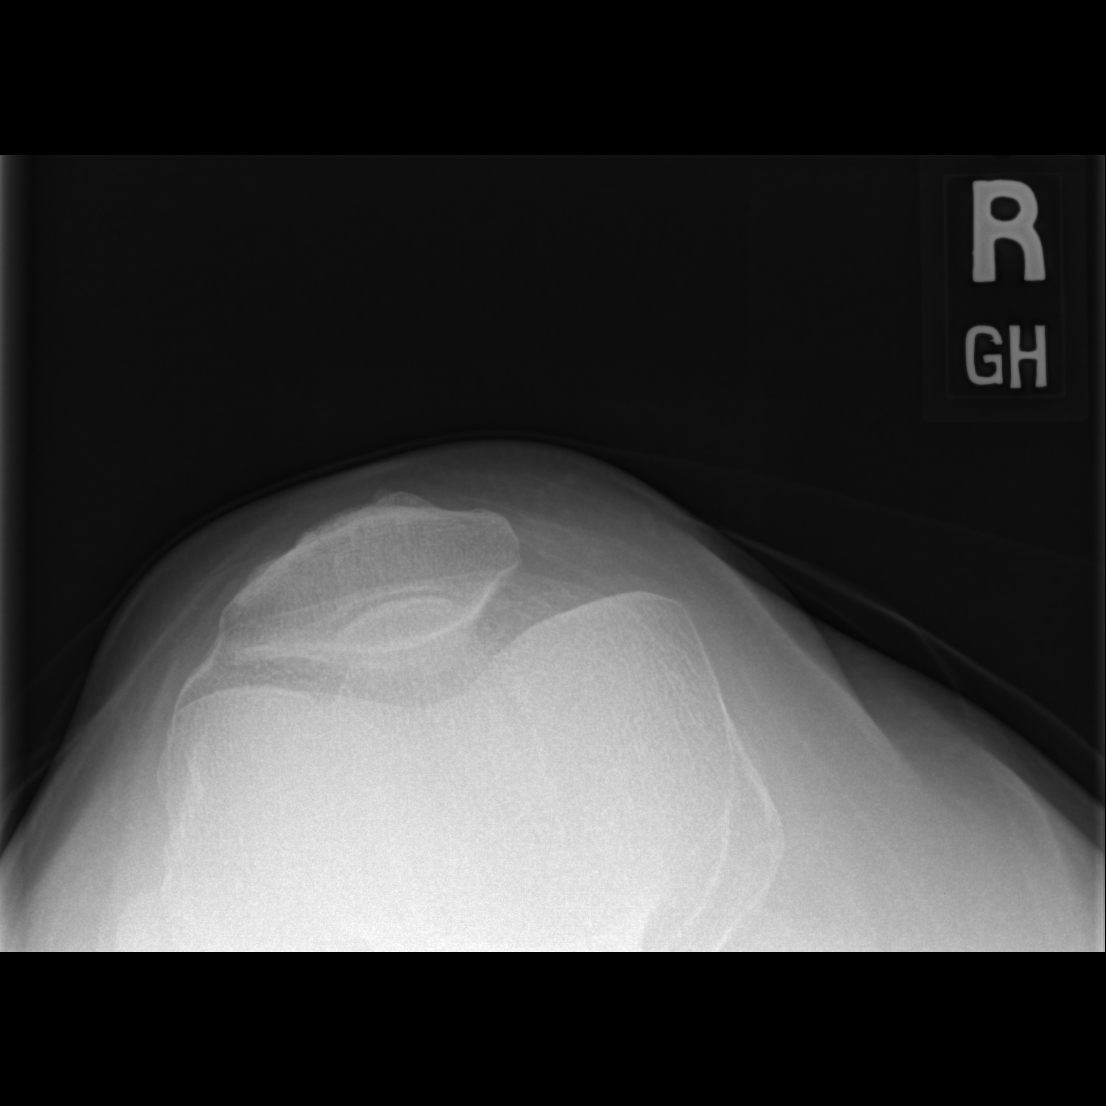

[3 of 3 positions shown; findings below may reference images not displayed]

FINDINGS: Normal alignment. Mild medial tibiofemoral joint space narrowing.
Trace patellofemoral spurring. No fracture, erosion, bony
destruction or focal bone abnormality. Small quadriceps tendon
enthesophyte. No significant knee joint effusion. Unremarkable soft
tissues.
IMPRESSION: Mild medial tibiofemoral joint space narrowing and trace
patellofemoral spurring.

## 2022-08-15 ENCOUNTER — Other Ambulatory Visit: Payer: Self-pay | Admitting: Internal Medicine

## 2022-08-15 DIAGNOSIS — Z1231 Encounter for screening mammogram for malignant neoplasm of breast: Secondary | ICD-10-CM

## 2022-08-16 ENCOUNTER — Ambulatory Visit
Admission: RE | Admit: 2022-08-16 | Discharge: 2022-08-16 | Disposition: A | Discharge status: Home / Self Care | Payer: 59 | Source: Ambulatory Visit | Attending: Internal Medicine | Admitting: Internal Medicine

## 2022-08-16 DIAGNOSIS — Z1231 Encounter for screening mammogram for malignant neoplasm of breast: Secondary | ICD-10-CM

## 2024-07-27 ENCOUNTER — Other Ambulatory Visit (HOSPITAL_COMMUNITY): Payer: Self-pay

## 2024-07-27 DIAGNOSIS — M1712 Unilateral primary osteoarthritis, left knee: Secondary | ICD-10-CM

## 2024-07-27 DIAGNOSIS — M25562 Pain in left knee: Secondary | ICD-10-CM

## 2024-07-27 DIAGNOSIS — M7652 Patellar tendinitis, left knee: Secondary | ICD-10-CM

## 2024-08-10 ENCOUNTER — Ambulatory Visit (HOSPITAL_COMMUNITY)
Admission: RE | Admit: 2024-08-10 | Discharge: 2024-08-10 | Disposition: A | Discharge status: Home / Self Care | Source: Ambulatory Visit

## 2024-08-10 DIAGNOSIS — M25562 Pain in left knee: Secondary | ICD-10-CM | POA: Diagnosis present

## 2024-08-10 DIAGNOSIS — M1712 Unilateral primary osteoarthritis, left knee: Secondary | ICD-10-CM | POA: Insufficient documentation

## 2024-08-10 DIAGNOSIS — M7652 Patellar tendinitis, left knee: Secondary | ICD-10-CM | POA: Insufficient documentation

## 2024-08-18 ENCOUNTER — Other Ambulatory Visit (HOSPITAL_COMMUNITY): Payer: Self-pay

## 2024-08-18 DIAGNOSIS — M545 Low back pain, unspecified: Secondary | ICD-10-CM

## 2024-08-18 DIAGNOSIS — M51361 Other intervertebral disc degeneration, lumbar region with lower extremity pain only: Secondary | ICD-10-CM

## 2024-08-18 DIAGNOSIS — M5416 Radiculopathy, lumbar region: Secondary | ICD-10-CM
